# Patient Record
Sex: Male | Born: 1935
Health system: Southern US, Community
[De-identification: ages and names within clinical notes are randomized; demographics above are authoritative.]

## PROBLEM LIST (undated history)

## (undated) DIAGNOSIS — F329 Major depressive disorder, single episode, unspecified: Secondary | ICD-10-CM

## (undated) DIAGNOSIS — K2970 Gastritis, unspecified, without bleeding: Secondary | ICD-10-CM

## (undated) DIAGNOSIS — K589 Irritable bowel syndrome without diarrhea: Secondary | ICD-10-CM

## (undated) DIAGNOSIS — H269 Unspecified cataract: Secondary | ICD-10-CM

## (undated) DIAGNOSIS — I6330 Cerebral infarction due to thrombosis of unspecified cerebral artery: Secondary | ICD-10-CM

## (undated) DIAGNOSIS — C44310 Basal cell carcinoma of skin of unspecified parts of face: Secondary | ICD-10-CM

## (undated) DIAGNOSIS — M1712 Unilateral primary osteoarthritis, left knee: Secondary | ICD-10-CM

## (undated) DIAGNOSIS — K222 Esophageal obstruction: Secondary | ICD-10-CM

## (undated) DIAGNOSIS — C4492 Squamous cell carcinoma of skin, unspecified: Secondary | ICD-10-CM

## (undated) DIAGNOSIS — F419 Anxiety disorder, unspecified: Secondary | ICD-10-CM

## (undated) DIAGNOSIS — N401 Enlarged prostate with lower urinary tract symptoms: Secondary | ICD-10-CM

## (undated) DIAGNOSIS — J189 Pneumonia, unspecified organism: Secondary | ICD-10-CM

## (undated) DIAGNOSIS — K5903 Drug induced constipation: Secondary | ICD-10-CM

## (undated) DIAGNOSIS — N183 Chronic kidney disease, stage 3 (moderate): Secondary | ICD-10-CM

## (undated) DIAGNOSIS — G894 Chronic pain syndrome: Secondary | ICD-10-CM

## (undated) DIAGNOSIS — E785 Hyperlipidemia, unspecified: Secondary | ICD-10-CM

## (undated) DIAGNOSIS — K219 Gastro-esophageal reflux disease without esophagitis: Secondary | ICD-10-CM

## (undated) DIAGNOSIS — I10 Essential (primary) hypertension: Secondary | ICD-10-CM

## (undated) DIAGNOSIS — M1610 Unilateral primary osteoarthritis, unspecified hip: Secondary | ICD-10-CM

## (undated) DIAGNOSIS — K5730 Diverticulosis of large intestine without perforation or abscess without bleeding: Secondary | ICD-10-CM

## (undated) DIAGNOSIS — F1020 Alcohol dependence, uncomplicated: Secondary | ICD-10-CM

## (undated) DIAGNOSIS — I700 Atherosclerosis of aorta: Secondary | ICD-10-CM

## (undated) DIAGNOSIS — E669 Obesity, unspecified: Secondary | ICD-10-CM

## (undated) DIAGNOSIS — M7989 Other specified soft tissue disorders: Secondary | ICD-10-CM

## (undated) DIAGNOSIS — N138 Other obstructive and reflux uropathy: Secondary | ICD-10-CM

## (undated) HISTORY — DX: Irritable bowel syndrome, unspecified: K58.9

## (undated) HISTORY — DX: Chronic pain syndrome: G89.4

## (undated) HISTORY — PX: APPENDECTOMY: SHX54

## (undated) HISTORY — DX: Major depressive disorder, single episode, unspecified: F32.9

## (undated) HISTORY — DX: Drug induced constipation: K59.03

## (undated) HISTORY — DX: Squamous cell carcinoma of skin, unspecified: C44.92

## (undated) HISTORY — DX: Anxiety disorder, unspecified: F41.9

## (undated) HISTORY — DX: Other specified soft tissue disorders: M79.89

## (undated) HISTORY — DX: Diverticulosis of large intestine without perforation or abscess without bleeding: K57.30

## (undated) HISTORY — DX: Atherosclerosis of aorta: I70.0

## (undated) HISTORY — DX: Unilateral primary osteoarthritis, unspecified hip: M16.10

## (undated) HISTORY — DX: Basal cell carcinoma of skin of unspecified parts of face: C44.310

## (undated) HISTORY — DX: Obesity, unspecified: E66.9

## (undated) HISTORY — DX: Essential (primary) hypertension: I10

## (undated) HISTORY — DX: Chronic kidney disease, stage 3 (moderate): N18.3

## (undated) HISTORY — DX: Alcohol dependence, uncomplicated: F10.20

## (undated) HISTORY — DX: Benign prostatic hyperplasia with lower urinary tract symptoms: N40.1

## (undated) HISTORY — DX: Other obstructive and reflux uropathy: N13.8

## (undated) HISTORY — PX: EYE SURGERY: SHX253

## (undated) HISTORY — DX: Unspecified cataract: H26.9

## (undated) HISTORY — DX: Gastro-esophageal reflux disease without esophagitis: K21.9

## (undated) HISTORY — DX: Gastritis, unspecified, without bleeding: K29.70

## (undated) HISTORY — DX: Esophageal obstruction: K22.2

## (undated) HISTORY — DX: Unilateral primary osteoarthritis, left knee: M17.12

---
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
## 1981-06-20 HISTORY — PX: FRACTURE SURGERY: SHX138

## 1997-10-24 ENCOUNTER — Encounter: Admission: RE | Admit: 1997-10-24 | Discharge: 1997-10-24 | Payer: Self-pay | Admitting: Internal Medicine

## 1998-02-11 ENCOUNTER — Encounter: Admission: RE | Admit: 1998-02-11 | Discharge: 1998-02-11 | Payer: Self-pay | Admitting: Internal Medicine

## 1998-05-07 ENCOUNTER — Encounter: Admission: RE | Admit: 1998-05-07 | Discharge: 1998-05-07 | Payer: Self-pay | Admitting: Hematology and Oncology

## 1998-05-26 ENCOUNTER — Encounter: Admission: RE | Admit: 1998-05-26 | Discharge: 1998-05-26 | Payer: Self-pay | Admitting: Internal Medicine

## 1998-06-23 ENCOUNTER — Ambulatory Visit (HOSPITAL_COMMUNITY): Admission: RE | Admit: 1998-06-23 | Discharge: 1998-06-23 | Payer: Self-pay | Admitting: Internal Medicine

## 1998-06-23 ENCOUNTER — Encounter: Admission: RE | Admit: 1998-06-23 | Discharge: 1998-06-23 | Payer: Self-pay | Admitting: Internal Medicine

## 1998-06-23 ENCOUNTER — Encounter: Payer: Self-pay | Admitting: Internal Medicine

## 1998-08-25 ENCOUNTER — Encounter: Admission: RE | Admit: 1998-08-25 | Discharge: 1998-08-25 | Payer: Self-pay | Admitting: Internal Medicine

## 1998-09-28 ENCOUNTER — Encounter: Admission: RE | Admit: 1998-09-28 | Discharge: 1998-09-28 | Payer: Self-pay | Admitting: Internal Medicine

## 1998-11-23 ENCOUNTER — Encounter: Admission: RE | Admit: 1998-11-23 | Discharge: 1998-11-23 | Payer: Self-pay | Admitting: Internal Medicine

## 1999-01-20 ENCOUNTER — Ambulatory Visit (HOSPITAL_COMMUNITY): Admission: RE | Admit: 1999-01-20 | Discharge: 1999-01-20 | Payer: Self-pay | Admitting: Internal Medicine

## 1999-01-20 ENCOUNTER — Encounter: Payer: Self-pay | Admitting: Internal Medicine

## 1999-01-20 ENCOUNTER — Encounter: Admission: RE | Admit: 1999-01-20 | Discharge: 1999-01-20 | Payer: Self-pay | Admitting: Internal Medicine

## 1999-02-03 ENCOUNTER — Encounter: Admission: RE | Admit: 1999-02-03 | Discharge: 1999-02-03 | Payer: Self-pay | Admitting: Internal Medicine

## 1999-02-12 ENCOUNTER — Ambulatory Visit (HOSPITAL_COMMUNITY): Admission: RE | Admit: 1999-02-12 | Discharge: 1999-02-12 | Payer: Self-pay | Admitting: Internal Medicine

## 1999-02-12 ENCOUNTER — Encounter: Payer: Self-pay | Admitting: Internal Medicine

## 1999-02-17 ENCOUNTER — Encounter: Admission: RE | Admit: 1999-02-17 | Discharge: 1999-02-17 | Payer: Self-pay | Admitting: Internal Medicine

## 1999-02-28 ENCOUNTER — Emergency Department (HOSPITAL_COMMUNITY): Admission: EM | Admit: 1999-02-28 | Discharge: 1999-02-28 | Payer: Self-pay | Admitting: Emergency Medicine

## 1999-02-28 ENCOUNTER — Encounter: Payer: Self-pay | Admitting: Emergency Medicine

## 1999-03-04 ENCOUNTER — Encounter: Payer: Self-pay | Admitting: *Deleted

## 1999-03-04 ENCOUNTER — Emergency Department (HOSPITAL_COMMUNITY): Admission: EM | Admit: 1999-03-04 | Discharge: 1999-03-04 | Payer: Self-pay | Admitting: *Deleted

## 1999-03-10 ENCOUNTER — Encounter: Admission: RE | Admit: 1999-03-10 | Discharge: 1999-03-10 | Payer: Self-pay | Admitting: Internal Medicine

## 1999-03-24 ENCOUNTER — Encounter: Admission: RE | Admit: 1999-03-24 | Discharge: 1999-03-24 | Payer: Self-pay | Admitting: Internal Medicine

## 1999-03-25 ENCOUNTER — Encounter: Admission: RE | Admit: 1999-03-25 | Discharge: 1999-06-23 | Payer: Self-pay | Admitting: Internal Medicine

## 1999-04-06 ENCOUNTER — Encounter: Admission: RE | Admit: 1999-04-06 | Discharge: 1999-04-06 | Payer: Self-pay | Admitting: Hematology and Oncology

## 1999-04-27 ENCOUNTER — Encounter: Admission: RE | Admit: 1999-04-27 | Discharge: 1999-04-27 | Payer: Self-pay | Admitting: Internal Medicine

## 1999-05-19 ENCOUNTER — Encounter: Admission: RE | Admit: 1999-05-19 | Discharge: 1999-05-19 | Payer: Self-pay | Admitting: Internal Medicine

## 1999-07-08 ENCOUNTER — Encounter: Admission: RE | Admit: 1999-07-08 | Discharge: 1999-10-06 | Payer: Self-pay | Admitting: Internal Medicine

## 1999-08-04 ENCOUNTER — Encounter: Admission: RE | Admit: 1999-08-04 | Discharge: 1999-08-04 | Payer: Self-pay | Admitting: Internal Medicine

## 1999-09-22 ENCOUNTER — Encounter: Admission: RE | Admit: 1999-09-22 | Discharge: 1999-09-22 | Payer: Self-pay | Admitting: Internal Medicine

## 1999-12-01 ENCOUNTER — Encounter: Admission: RE | Admit: 1999-12-01 | Discharge: 1999-12-01 | Payer: Self-pay | Admitting: Internal Medicine

## 2000-01-19 ENCOUNTER — Ambulatory Visit (HOSPITAL_COMMUNITY): Admission: RE | Admit: 2000-01-19 | Discharge: 2000-01-19 | Payer: Self-pay | Admitting: Hematology and Oncology

## 2000-01-19 ENCOUNTER — Encounter: Payer: Self-pay | Admitting: Hematology and Oncology

## 2000-01-19 ENCOUNTER — Encounter: Admission: RE | Admit: 2000-01-19 | Discharge: 2000-01-19 | Payer: Self-pay | Admitting: Hematology and Oncology

## 2000-02-17 ENCOUNTER — Emergency Department (HOSPITAL_COMMUNITY): Admission: EM | Admit: 2000-02-17 | Discharge: 2000-02-17 | Payer: Self-pay | Admitting: Emergency Medicine

## 2000-03-21 ENCOUNTER — Encounter: Admission: RE | Admit: 2000-03-21 | Discharge: 2000-03-21 | Payer: Self-pay | Admitting: Internal Medicine

## 2000-07-05 ENCOUNTER — Encounter: Admission: RE | Admit: 2000-07-05 | Discharge: 2000-07-05 | Payer: Self-pay | Admitting: Internal Medicine

## 2000-08-03 ENCOUNTER — Encounter (INDEPENDENT_AMBULATORY_CARE_PROVIDER_SITE_OTHER): Payer: Self-pay | Admitting: Specialist

## 2000-08-03 ENCOUNTER — Other Ambulatory Visit: Admission: RE | Admit: 2000-08-03 | Discharge: 2000-08-03 | Payer: Self-pay | Admitting: Gastroenterology

## 2000-08-29 ENCOUNTER — Encounter: Admission: RE | Admit: 2000-08-29 | Discharge: 2000-08-29 | Payer: Self-pay | Admitting: Internal Medicine

## 2000-11-01 ENCOUNTER — Encounter: Admission: RE | Admit: 2000-11-01 | Discharge: 2001-01-30 | Payer: Self-pay | Admitting: Anesthesiology

## 2000-11-07 ENCOUNTER — Encounter: Admission: RE | Admit: 2000-11-07 | Discharge: 2000-11-07 | Payer: Self-pay | Admitting: Internal Medicine

## 2000-12-19 ENCOUNTER — Encounter: Admission: RE | Admit: 2000-12-19 | Discharge: 2000-12-19 | Payer: Self-pay | Admitting: Internal Medicine

## 2001-02-13 ENCOUNTER — Encounter: Admission: RE | Admit: 2001-02-13 | Discharge: 2001-02-17 | Payer: Self-pay | Admitting: Anesthesiology

## 2001-02-21 ENCOUNTER — Encounter: Admission: RE | Admit: 2001-02-21 | Discharge: 2001-02-21 | Payer: Self-pay | Admitting: Internal Medicine

## 2001-03-19 ENCOUNTER — Encounter: Admission: RE | Admit: 2001-03-19 | Discharge: 2001-03-19 | Payer: Self-pay | Admitting: Internal Medicine

## 2001-05-01 ENCOUNTER — Encounter: Admission: RE | Admit: 2001-05-01 | Discharge: 2001-05-01 | Payer: Self-pay | Admitting: Internal Medicine

## 2001-05-09 ENCOUNTER — Ambulatory Visit (HOSPITAL_COMMUNITY): Admission: RE | Admit: 2001-05-09 | Discharge: 2001-05-09 | Payer: Self-pay | Admitting: Internal Medicine

## 2001-05-14 ENCOUNTER — Encounter: Admission: RE | Admit: 2001-05-14 | Discharge: 2001-05-14 | Payer: Self-pay | Admitting: Internal Medicine

## 2001-06-06 ENCOUNTER — Encounter: Admission: RE | Admit: 2001-06-06 | Discharge: 2001-06-06 | Payer: Self-pay | Admitting: Internal Medicine

## 2001-07-18 ENCOUNTER — Encounter: Admission: RE | Admit: 2001-07-18 | Discharge: 2001-07-18 | Payer: Self-pay | Admitting: Internal Medicine

## 2001-09-12 ENCOUNTER — Encounter: Admission: RE | Admit: 2001-09-12 | Discharge: 2001-09-12 | Payer: Self-pay | Admitting: Internal Medicine

## 2001-10-15 ENCOUNTER — Encounter: Payer: Self-pay | Admitting: Emergency Medicine

## 2001-10-15 ENCOUNTER — Inpatient Hospital Stay (HOSPITAL_COMMUNITY): Admission: EM | Admit: 2001-10-15 | Discharge: 2001-10-17 | Payer: Self-pay | Admitting: Emergency Medicine

## 2001-10-16 ENCOUNTER — Encounter: Payer: Self-pay | Admitting: Internal Medicine

## 2001-11-14 ENCOUNTER — Encounter: Admission: RE | Admit: 2001-11-14 | Discharge: 2001-11-14 | Payer: Self-pay | Admitting: Internal Medicine

## 2001-12-26 ENCOUNTER — Encounter: Admission: RE | Admit: 2001-12-26 | Discharge: 2001-12-26 | Payer: Self-pay | Admitting: Internal Medicine

## 2002-01-15 ENCOUNTER — Encounter: Admission: RE | Admit: 2002-01-15 | Discharge: 2002-01-15 | Payer: Self-pay | Admitting: Internal Medicine

## 2002-03-19 ENCOUNTER — Encounter: Admission: RE | Admit: 2002-03-19 | Discharge: 2002-03-19 | Payer: Self-pay | Admitting: Internal Medicine

## 2002-03-22 ENCOUNTER — Encounter: Admission: RE | Admit: 2002-03-22 | Discharge: 2002-03-22 | Payer: Self-pay | Admitting: Internal Medicine

## 2002-05-22 ENCOUNTER — Encounter: Admission: RE | Admit: 2002-05-22 | Discharge: 2002-05-22 | Payer: Self-pay | Admitting: Internal Medicine

## 2002-07-31 ENCOUNTER — Encounter: Admission: RE | Admit: 2002-07-31 | Discharge: 2002-07-31 | Payer: Self-pay | Admitting: Internal Medicine

## 2002-10-18 ENCOUNTER — Encounter: Admission: RE | Admit: 2002-10-18 | Discharge: 2002-10-18 | Payer: Self-pay | Admitting: Internal Medicine

## 2002-12-18 ENCOUNTER — Ambulatory Visit (HOSPITAL_COMMUNITY): Admission: RE | Admit: 2002-12-18 | Discharge: 2002-12-18 | Payer: Self-pay | Admitting: Internal Medicine

## 2002-12-18 ENCOUNTER — Encounter: Admission: RE | Admit: 2002-12-18 | Discharge: 2002-12-18 | Payer: Self-pay | Admitting: Internal Medicine

## 2003-02-04 ENCOUNTER — Encounter: Admission: RE | Admit: 2003-02-04 | Discharge: 2003-02-04 | Payer: Self-pay | Admitting: Internal Medicine

## 2003-02-18 ENCOUNTER — Encounter: Payer: Self-pay | Admitting: Gastroenterology

## 2003-04-01 ENCOUNTER — Encounter: Admission: RE | Admit: 2003-04-01 | Discharge: 2003-04-01 | Payer: Self-pay | Admitting: Internal Medicine

## 2003-06-05 DIAGNOSIS — C4492 Squamous cell carcinoma of skin, unspecified: Secondary | ICD-10-CM

## 2003-06-05 DIAGNOSIS — C4491 Basal cell carcinoma of skin, unspecified: Secondary | ICD-10-CM

## 2003-06-05 HISTORY — DX: Squamous cell carcinoma of skin, unspecified: C44.92

## 2003-06-05 HISTORY — DX: Basal cell carcinoma of skin, unspecified: C44.91

## 2003-06-24 ENCOUNTER — Encounter: Admission: RE | Admit: 2003-06-24 | Discharge: 2003-06-24 | Payer: Self-pay | Admitting: Internal Medicine

## 2003-07-08 ENCOUNTER — Encounter: Admission: RE | Admit: 2003-07-08 | Discharge: 2003-07-08 | Payer: Self-pay | Admitting: Internal Medicine

## 2003-07-16 ENCOUNTER — Encounter: Admission: RE | Admit: 2003-07-16 | Discharge: 2003-07-16 | Payer: Self-pay | Admitting: Internal Medicine

## 2003-07-28 ENCOUNTER — Ambulatory Visit (HOSPITAL_COMMUNITY): Admission: RE | Admit: 2003-07-28 | Discharge: 2003-07-28 | Payer: Self-pay | Admitting: Neurology

## 2003-08-13 ENCOUNTER — Encounter: Admission: RE | Admit: 2003-08-13 | Discharge: 2003-08-13 | Payer: Self-pay | Admitting: Internal Medicine

## 2003-08-20 ENCOUNTER — Encounter: Admission: RE | Admit: 2003-08-20 | Discharge: 2003-08-20 | Payer: Self-pay | Admitting: Internal Medicine

## 2003-10-01 ENCOUNTER — Encounter: Admission: RE | Admit: 2003-10-01 | Discharge: 2003-10-01 | Payer: Self-pay | Admitting: Internal Medicine

## 2003-11-13 ENCOUNTER — Encounter: Admission: RE | Admit: 2003-11-13 | Discharge: 2003-11-13 | Payer: Self-pay | Admitting: Internal Medicine

## 2004-01-15 ENCOUNTER — Encounter: Admission: RE | Admit: 2004-01-15 | Discharge: 2004-01-15 | Payer: Self-pay | Admitting: Internal Medicine

## 2004-03-22 ENCOUNTER — Ambulatory Visit: Payer: Self-pay | Admitting: Internal Medicine

## 2004-04-19 ENCOUNTER — Ambulatory Visit: Payer: Self-pay | Admitting: Internal Medicine

## 2004-05-26 ENCOUNTER — Ambulatory Visit: Payer: Self-pay | Admitting: Internal Medicine

## 2004-07-26 ENCOUNTER — Ambulatory Visit: Payer: Self-pay | Admitting: Internal Medicine

## 2004-09-01 ENCOUNTER — Ambulatory Visit (HOSPITAL_COMMUNITY): Admission: RE | Admit: 2004-09-01 | Discharge: 2004-09-01 | Payer: Self-pay | Admitting: Anesthesiology

## 2004-10-25 ENCOUNTER — Ambulatory Visit: Payer: Self-pay | Admitting: Internal Medicine

## 2004-11-16 ENCOUNTER — Ambulatory Visit: Payer: Self-pay | Admitting: Gastroenterology

## 2004-11-22 ENCOUNTER — Ambulatory Visit: Payer: Self-pay | Admitting: Gastroenterology

## 2004-11-22 ENCOUNTER — Ambulatory Visit (HOSPITAL_COMMUNITY): Admission: RE | Admit: 2004-11-22 | Discharge: 2004-11-22 | Payer: Self-pay | Admitting: Gastroenterology

## 2004-11-22 ENCOUNTER — Encounter (INDEPENDENT_AMBULATORY_CARE_PROVIDER_SITE_OTHER): Payer: Self-pay | Admitting: *Deleted

## 2005-01-03 ENCOUNTER — Ambulatory Visit: Payer: Self-pay | Admitting: Internal Medicine

## 2005-01-06 ENCOUNTER — Ambulatory Visit (HOSPITAL_COMMUNITY): Admission: RE | Admit: 2005-01-06 | Discharge: 2005-01-06 | Payer: Self-pay | Admitting: Internal Medicine

## 2005-02-02 ENCOUNTER — Ambulatory Visit: Payer: Self-pay | Admitting: Internal Medicine

## 2005-02-14 ENCOUNTER — Ambulatory Visit: Payer: Self-pay | Admitting: Internal Medicine

## 2005-04-04 ENCOUNTER — Ambulatory Visit: Payer: Self-pay | Admitting: Internal Medicine

## 2005-07-18 ENCOUNTER — Ambulatory Visit: Payer: Self-pay | Admitting: Internal Medicine

## 2005-09-12 ENCOUNTER — Ambulatory Visit: Payer: Self-pay | Admitting: Internal Medicine

## 2005-09-27 ENCOUNTER — Ambulatory Visit: Payer: Self-pay | Admitting: Internal Medicine

## 2005-10-31 ENCOUNTER — Ambulatory Visit: Payer: Self-pay | Admitting: Internal Medicine

## 2005-11-22 ENCOUNTER — Ambulatory Visit: Payer: Self-pay | Admitting: Internal Medicine

## 2006-01-02 ENCOUNTER — Ambulatory Visit: Payer: Self-pay | Admitting: Internal Medicine

## 2006-02-07 ENCOUNTER — Ambulatory Visit: Payer: Self-pay | Admitting: Internal Medicine

## 2006-05-08 ENCOUNTER — Ambulatory Visit: Payer: Self-pay | Admitting: Internal Medicine

## 2006-05-08 LAB — CONVERTED CEMR LAB
AST: 25 units/L (ref 0–37)
Albumin: 4.5 g/dL (ref 3.5–5.2)
BUN: 29 mg/dL — ABNORMAL HIGH (ref 6–23)
Calcium: 9.6 mg/dL (ref 8.4–10.5)
Chloride: 99 meq/L (ref 96–112)
Glucose, Bld: 81 mg/dL (ref 70–99)
Lymphocytes Relative: 34 % (ref 15–43)
Lymphs Abs: 2.2 10*3/uL (ref 0.8–3.1)
Neutrophils Relative %: 54 % (ref 47–77)
Platelets: 179 10*3/uL (ref 152–374)
Potassium: 4.3 meq/L (ref 3.5–5.3)
RDW: 12.3 % (ref 11.5–15.3)
Sodium: 139 meq/L (ref 135–145)
Total Protein: 7.2 g/dL (ref 6.0–8.3)
WBC: 6.7 10*3/uL (ref 3.7–10.0)

## 2006-05-22 ENCOUNTER — Ambulatory Visit: Payer: Self-pay | Admitting: Internal Medicine

## 2006-06-22 DIAGNOSIS — F332 Major depressive disorder, recurrent severe without psychotic features: Secondary | ICD-10-CM | POA: Insufficient documentation

## 2006-06-22 DIAGNOSIS — G894 Chronic pain syndrome: Secondary | ICD-10-CM

## 2006-06-22 DIAGNOSIS — F419 Anxiety disorder, unspecified: Secondary | ICD-10-CM

## 2006-06-22 DIAGNOSIS — I10 Essential (primary) hypertension: Secondary | ICD-10-CM

## 2006-06-22 DIAGNOSIS — N138 Other obstructive and reflux uropathy: Secondary | ICD-10-CM | POA: Insufficient documentation

## 2006-06-22 DIAGNOSIS — K222 Esophageal obstruction: Secondary | ICD-10-CM

## 2006-06-22 DIAGNOSIS — C44310 Basal cell carcinoma of skin of unspecified parts of face: Secondary | ICD-10-CM

## 2006-06-22 DIAGNOSIS — N401 Enlarged prostate with lower urinary tract symptoms: Secondary | ICD-10-CM

## 2006-06-22 DIAGNOSIS — K219 Gastro-esophageal reflux disease without esophagitis: Secondary | ICD-10-CM

## 2006-06-22 DIAGNOSIS — K589 Irritable bowel syndrome without diarrhea: Secondary | ICD-10-CM

## 2006-06-22 DIAGNOSIS — K5730 Diverticulosis of large intestine without perforation or abscess without bleeding: Secondary | ICD-10-CM

## 2006-06-22 DIAGNOSIS — F329 Major depressive disorder, single episode, unspecified: Secondary | ICD-10-CM

## 2006-06-22 DIAGNOSIS — F1020 Alcohol dependence, uncomplicated: Secondary | ICD-10-CM | POA: Insufficient documentation

## 2006-06-22 HISTORY — DX: Diverticulosis of large intestine without perforation or abscess without bleeding: K57.30

## 2006-06-22 HISTORY — DX: Major depressive disorder, single episode, unspecified: F32.9

## 2006-06-22 HISTORY — DX: Anxiety disorder, unspecified: F41.9

## 2006-06-22 HISTORY — DX: Benign prostatic hyperplasia with lower urinary tract symptoms: N13.8

## 2006-06-22 HISTORY — DX: Essential (primary) hypertension: I10

## 2006-06-22 HISTORY — DX: Gastro-esophageal reflux disease without esophagitis: K21.9

## 2006-06-22 HISTORY — DX: Chronic pain syndrome: G89.4

## 2006-06-26 ENCOUNTER — Ambulatory Visit: Payer: Self-pay | Admitting: Internal Medicine

## 2006-06-28 ENCOUNTER — Ambulatory Visit (HOSPITAL_COMMUNITY): Admission: RE | Admit: 2006-06-28 | Discharge: 2006-06-28 | Payer: Self-pay | Admitting: Internal Medicine

## 2006-07-03 ENCOUNTER — Ambulatory Visit: Payer: Self-pay | Admitting: Internal Medicine

## 2006-07-17 ENCOUNTER — Ambulatory Visit: Payer: Self-pay | Admitting: Gastroenterology

## 2006-08-02 ENCOUNTER — Ambulatory Visit: Payer: Self-pay | Admitting: Gastroenterology

## 2006-08-02 ENCOUNTER — Encounter: Payer: Self-pay | Admitting: Internal Medicine

## 2006-08-02 ENCOUNTER — Encounter (INDEPENDENT_AMBULATORY_CARE_PROVIDER_SITE_OTHER): Payer: Self-pay | Admitting: *Deleted

## 2006-08-21 ENCOUNTER — Ambulatory Visit: Payer: Self-pay | Admitting: Internal Medicine

## 2006-08-28 ENCOUNTER — Telehealth: Payer: Self-pay | Admitting: *Deleted

## 2006-08-30 ENCOUNTER — Telehealth: Payer: Self-pay | Admitting: *Deleted

## 2006-08-31 ENCOUNTER — Ambulatory Visit: Payer: Self-pay | Admitting: Gastroenterology

## 2006-09-25 ENCOUNTER — Telehealth: Payer: Self-pay | Admitting: *Deleted

## 2006-10-30 ENCOUNTER — Ambulatory Visit: Payer: Self-pay | Admitting: Internal Medicine

## 2006-10-30 DIAGNOSIS — R42 Dizziness and giddiness: Secondary | ICD-10-CM | POA: Insufficient documentation

## 2006-10-30 LAB — CONVERTED CEMR LAB
BUN: 31 mg/dL — ABNORMAL HIGH (ref 6–23)
Chloride: 93 meq/L — ABNORMAL LOW (ref 96–112)
Creatinine, Ser: 1.41 mg/dL (ref 0.40–1.50)
Potassium: 4.4 meq/L (ref 3.5–5.3)

## 2006-11-03 ENCOUNTER — Telehealth (INDEPENDENT_AMBULATORY_CARE_PROVIDER_SITE_OTHER): Payer: Self-pay | Admitting: *Deleted

## 2006-12-07 ENCOUNTER — Telehealth: Payer: Self-pay | Admitting: *Deleted

## 2006-12-19 ENCOUNTER — Telehealth: Payer: Self-pay | Admitting: *Deleted

## 2006-12-25 ENCOUNTER — Ambulatory Visit: Payer: Self-pay | Admitting: Internal Medicine

## 2006-12-25 LAB — CONVERTED CEMR LAB
Albumin: 4.3 g/dL (ref 3.5–5.2)
Alkaline Phosphatase: 87 units/L (ref 39–117)
BUN: 27 mg/dL — ABNORMAL HIGH (ref 6–23)
CO2: 27 meq/L (ref 19–32)
Calcium: 9.2 mg/dL (ref 8.4–10.5)
Glucose, Bld: 91 mg/dL (ref 70–99)
Hemoglobin: 13 g/dL (ref 13.0–17.0)
MCHC: 33.9 g/dL (ref 30.0–36.0)
Potassium: 5.2 meq/L (ref 3.5–5.3)
RBC: 4.06 M/uL — ABNORMAL LOW (ref 4.22–5.81)
Sodium: 131 meq/L — ABNORMAL LOW (ref 135–145)
Total Protein: 7 g/dL (ref 6.0–8.3)
WBC: 5.7 10*3/uL (ref 4.0–10.5)

## 2007-01-01 ENCOUNTER — Telehealth (INDEPENDENT_AMBULATORY_CARE_PROVIDER_SITE_OTHER): Payer: Self-pay | Admitting: *Deleted

## 2007-01-01 ENCOUNTER — Encounter: Payer: Self-pay | Admitting: Internal Medicine

## 2007-01-04 ENCOUNTER — Ambulatory Visit: Admission: RE | Admit: 2007-01-04 | Discharge: 2007-03-19 | Payer: Self-pay | Admitting: Radiation Oncology

## 2007-01-10 ENCOUNTER — Ambulatory Visit (HOSPITAL_COMMUNITY): Admission: RE | Admit: 2007-01-10 | Discharge: 2007-01-10 | Payer: Self-pay | Admitting: Radiation Oncology

## 2007-01-17 ENCOUNTER — Telehealth (INDEPENDENT_AMBULATORY_CARE_PROVIDER_SITE_OTHER): Payer: Self-pay | Admitting: *Deleted

## 2007-01-31 ENCOUNTER — Ambulatory Visit: Payer: Self-pay | Admitting: Internal Medicine

## 2007-02-02 ENCOUNTER — Telehealth: Payer: Self-pay | Admitting: Internal Medicine

## 2007-03-01 ENCOUNTER — Encounter: Payer: Self-pay | Admitting: Internal Medicine

## 2007-03-01 ENCOUNTER — Telehealth (INDEPENDENT_AMBULATORY_CARE_PROVIDER_SITE_OTHER): Payer: Self-pay | Admitting: Internal Medicine

## 2007-03-16 ENCOUNTER — Telehealth: Payer: Self-pay | Admitting: Internal Medicine

## 2007-04-10 ENCOUNTER — Telehealth: Payer: Self-pay | Admitting: Internal Medicine

## 2007-04-11 ENCOUNTER — Telehealth: Payer: Self-pay | Admitting: *Deleted

## 2007-04-30 ENCOUNTER — Ambulatory Visit: Payer: Self-pay | Admitting: Internal Medicine

## 2007-04-30 LAB — CONVERTED CEMR LAB
BUN: 24 mg/dL — ABNORMAL HIGH (ref 6–23)
Chloride: 97 meq/L (ref 96–112)
Potassium: 5 meq/L (ref 3.5–5.3)
Sodium: 135 meq/L (ref 135–145)

## 2007-05-08 ENCOUNTER — Encounter: Payer: Self-pay | Admitting: Internal Medicine

## 2007-05-25 ENCOUNTER — Ambulatory Visit: Payer: Self-pay | Admitting: Internal Medicine

## 2007-05-25 LAB — CONVERTED CEMR LAB
BUN: 32 mg/dL — ABNORMAL HIGH (ref 6–23)
CO2: 26 meq/L (ref 19–32)
Chloride: 94 meq/L — ABNORMAL LOW (ref 96–112)
Glucose, Bld: 94 mg/dL (ref 70–99)
Potassium: 5.3 meq/L (ref 3.5–5.3)
Sodium: 133 meq/L — ABNORMAL LOW (ref 135–145)

## 2007-06-06 ENCOUNTER — Encounter: Payer: Self-pay | Admitting: Internal Medicine

## 2007-06-07 ENCOUNTER — Telehealth: Payer: Self-pay | Admitting: Internal Medicine

## 2007-06-29 ENCOUNTER — Ambulatory Visit: Payer: Self-pay | Admitting: Internal Medicine

## 2007-06-29 ENCOUNTER — Encounter: Payer: Self-pay | Admitting: Licensed Clinical Social Worker

## 2007-07-09 ENCOUNTER — Encounter: Payer: Self-pay | Admitting: Internal Medicine

## 2007-07-09 ENCOUNTER — Telehealth (INDEPENDENT_AMBULATORY_CARE_PROVIDER_SITE_OTHER): Payer: Self-pay | Admitting: *Deleted

## 2007-07-30 ENCOUNTER — Ambulatory Visit: Payer: Self-pay | Admitting: Internal Medicine

## 2007-08-09 ENCOUNTER — Telehealth: Payer: Self-pay | Admitting: *Deleted

## 2007-09-11 ENCOUNTER — Encounter: Payer: Self-pay | Admitting: Internal Medicine

## 2007-09-11 ENCOUNTER — Telehealth: Payer: Self-pay | Admitting: Internal Medicine

## 2007-10-10 ENCOUNTER — Ambulatory Visit: Payer: Self-pay | Admitting: Internal Medicine

## 2007-11-08 ENCOUNTER — Encounter: Payer: Self-pay | Admitting: Internal Medicine

## 2007-12-03 ENCOUNTER — Ambulatory Visit: Payer: Self-pay | Admitting: Internal Medicine

## 2007-12-05 LAB — CONVERTED CEMR LAB
BUN: 31 mg/dL — ABNORMAL HIGH (ref 6–23)
CO2: 25 meq/L (ref 19–32)
Calcium: 9 mg/dL (ref 8.4–10.5)
Cholesterol: 139 mg/dL (ref 0–200)
Creatinine, Ser: 1.47 mg/dL (ref 0.40–1.50)
Glucose, Bld: 91 mg/dL (ref 70–99)
Sodium: 131 meq/L — ABNORMAL LOW (ref 135–145)
Total CHOL/HDL Ratio: 3.5

## 2007-12-07 ENCOUNTER — Telehealth: Payer: Self-pay | Admitting: Internal Medicine

## 2008-01-04 ENCOUNTER — Emergency Department (HOSPITAL_COMMUNITY): Admission: EM | Admit: 2008-01-04 | Discharge: 2008-01-05 | Payer: Self-pay | Admitting: Emergency Medicine

## 2008-01-07 ENCOUNTER — Ambulatory Visit: Payer: Self-pay | Admitting: *Deleted

## 2008-01-07 ENCOUNTER — Telehealth: Payer: Self-pay | Admitting: Internal Medicine

## 2008-01-07 ENCOUNTER — Encounter: Payer: Self-pay | Admitting: Internal Medicine

## 2008-01-08 LAB — CONVERTED CEMR LAB
BUN: 25 mg/dL — ABNORMAL HIGH (ref 6–23)
Chloride: 93 meq/L — ABNORMAL LOW (ref 96–112)
Creatinine, Ser: 1.47 mg/dL (ref 0.40–1.50)
Eosinophils Absolute: 0.1 10*3/uL (ref 0.0–0.7)
Glucose, Bld: 111 mg/dL — ABNORMAL HIGH (ref 70–99)
Hemoglobin: 11.1 g/dL — ABNORMAL LOW (ref 13.0–17.0)
Lymphs Abs: 1.2 10*3/uL (ref 0.7–4.0)
MCHC: 35.7 g/dL (ref 30.0–36.0)
MCV: 94.8 fL (ref 78.0–100.0)
Monocytes Absolute: 0.8 10*3/uL (ref 0.1–1.0)
Monocytes Relative: 12 % (ref 3–12)
Neutro Abs: 4.5 10*3/uL (ref 1.7–7.7)
Neutrophils Relative %: 69 % (ref 43–77)
Potassium: 4.6 meq/L (ref 3.5–5.3)
RBC: 3.28 M/uL — ABNORMAL LOW (ref 4.22–5.81)
WBC: 6.6 10*3/uL (ref 4.0–10.5)

## 2008-01-16 ENCOUNTER — Encounter: Payer: Self-pay | Admitting: Internal Medicine

## 2008-01-22 ENCOUNTER — Ambulatory Visit: Payer: Self-pay | Admitting: *Deleted

## 2008-01-22 ENCOUNTER — Encounter (INDEPENDENT_AMBULATORY_CARE_PROVIDER_SITE_OTHER): Payer: Self-pay | Admitting: *Deleted

## 2008-01-22 ENCOUNTER — Ambulatory Visit: Payer: Self-pay | Admitting: Vascular Surgery

## 2008-01-22 ENCOUNTER — Encounter: Payer: Self-pay | Admitting: Internal Medicine

## 2008-01-22 ENCOUNTER — Ambulatory Visit (HOSPITAL_COMMUNITY): Admission: RE | Admit: 2008-01-22 | Discharge: 2008-01-22 | Payer: Self-pay | Admitting: *Deleted

## 2008-01-22 DIAGNOSIS — R609 Edema, unspecified: Secondary | ICD-10-CM

## 2008-01-23 LAB — CONVERTED CEMR LAB
Basophils Absolute: 0 10*3/uL (ref 0.0–0.1)
Basophils Relative: 0 % (ref 0–1)
Eosinophils Absolute: 0.2 10*3/uL (ref 0.0–0.7)
MCHC: 33.4 g/dL (ref 30.0–36.0)
MCV: 98 fL (ref 78.0–100.0)
Neutro Abs: 5.2 10*3/uL (ref 1.7–7.7)
Neutrophils Relative %: 68 % (ref 43–77)
Platelets: 245 10*3/uL (ref 150–400)

## 2008-01-25 ENCOUNTER — Ambulatory Visit: Payer: Self-pay | Admitting: *Deleted

## 2008-01-31 ENCOUNTER — Ambulatory Visit (HOSPITAL_COMMUNITY): Admission: RE | Admit: 2008-01-31 | Discharge: 2008-01-31 | Payer: Self-pay | Admitting: Internal Medicine

## 2008-01-31 ENCOUNTER — Encounter (INDEPENDENT_AMBULATORY_CARE_PROVIDER_SITE_OTHER): Payer: Self-pay | Admitting: Internal Medicine

## 2008-02-07 ENCOUNTER — Telehealth: Payer: Self-pay | Admitting: *Deleted

## 2008-02-08 ENCOUNTER — Encounter (INDEPENDENT_AMBULATORY_CARE_PROVIDER_SITE_OTHER): Payer: Self-pay | Admitting: Internal Medicine

## 2008-02-13 ENCOUNTER — Ambulatory Visit: Payer: Self-pay | Admitting: *Deleted

## 2008-02-13 ENCOUNTER — Inpatient Hospital Stay (HOSPITAL_COMMUNITY): Admission: AD | Admit: 2008-02-13 | Discharge: 2008-02-15 | Payer: Self-pay | Admitting: Internal Medicine

## 2008-02-13 ENCOUNTER — Ambulatory Visit: Payer: Self-pay | Admitting: Internal Medicine

## 2008-02-15 ENCOUNTER — Encounter: Payer: Self-pay | Admitting: Internal Medicine

## 2008-02-22 ENCOUNTER — Telehealth: Payer: Self-pay | Admitting: *Deleted

## 2008-02-29 ENCOUNTER — Encounter: Payer: Self-pay | Admitting: Internal Medicine

## 2008-02-29 ENCOUNTER — Ambulatory Visit: Payer: Self-pay | Admitting: Internal Medicine

## 2008-03-17 ENCOUNTER — Telehealth (INDEPENDENT_AMBULATORY_CARE_PROVIDER_SITE_OTHER): Payer: Self-pay | Admitting: *Deleted

## 2008-03-18 ENCOUNTER — Encounter: Payer: Self-pay | Admitting: Internal Medicine

## 2008-03-18 ENCOUNTER — Ambulatory Visit: Payer: Self-pay | Admitting: Internal Medicine

## 2008-03-18 ENCOUNTER — Ambulatory Visit (HOSPITAL_COMMUNITY): Admission: RE | Admit: 2008-03-18 | Discharge: 2008-03-18 | Payer: Self-pay | Admitting: Internal Medicine

## 2008-03-18 ENCOUNTER — Encounter: Payer: Self-pay | Admitting: Licensed Clinical Social Worker

## 2008-03-20 ENCOUNTER — Encounter: Payer: Self-pay | Admitting: Internal Medicine

## 2008-03-20 ENCOUNTER — Encounter: Payer: Self-pay | Admitting: Licensed Clinical Social Worker

## 2008-04-08 ENCOUNTER — Ambulatory Visit: Payer: Self-pay | Admitting: Internal Medicine

## 2008-04-08 ENCOUNTER — Encounter: Payer: Self-pay | Admitting: Internal Medicine

## 2008-04-08 ENCOUNTER — Ambulatory Visit (HOSPITAL_COMMUNITY): Admission: RE | Admit: 2008-04-08 | Discharge: 2008-04-08 | Payer: Self-pay | Admitting: Internal Medicine

## 2008-04-12 LAB — CONVERTED CEMR LAB
Potassium: 5 meq/L (ref 3.5–5.3)
Sodium: 138 meq/L (ref 135–145)

## 2008-04-15 ENCOUNTER — Telehealth: Payer: Self-pay | Admitting: Licensed Clinical Social Worker

## 2008-04-21 ENCOUNTER — Telehealth: Payer: Self-pay | Admitting: Internal Medicine

## 2008-04-25 ENCOUNTER — Ambulatory Visit: Payer: Self-pay | Admitting: Cardiology

## 2008-04-29 ENCOUNTER — Ambulatory Visit: Payer: Self-pay | Admitting: Internal Medicine

## 2008-04-29 ENCOUNTER — Encounter (INDEPENDENT_AMBULATORY_CARE_PROVIDER_SITE_OTHER): Payer: Self-pay | Admitting: Internal Medicine

## 2008-04-30 DIAGNOSIS — N259 Disorder resulting from impaired renal tubular function, unspecified: Secondary | ICD-10-CM | POA: Insufficient documentation

## 2008-04-30 LAB — CONVERTED CEMR LAB
CO2: 25 meq/L (ref 19–32)
Chloride: 96 meq/L (ref 96–112)
Glucose, Bld: 95 mg/dL (ref 70–99)
Potassium: 4.6 meq/L (ref 3.5–5.3)
Sodium: 133 meq/L — ABNORMAL LOW (ref 135–145)

## 2008-05-05 ENCOUNTER — Encounter: Payer: Self-pay | Admitting: Internal Medicine

## 2008-05-05 ENCOUNTER — Ambulatory Visit: Payer: Self-pay

## 2008-05-22 ENCOUNTER — Telehealth: Payer: Self-pay | Admitting: Internal Medicine

## 2008-05-23 ENCOUNTER — Encounter: Payer: Self-pay | Admitting: Internal Medicine

## 2008-06-08 ENCOUNTER — Emergency Department (HOSPITAL_COMMUNITY): Admission: EM | Admit: 2008-06-08 | Discharge: 2008-06-08 | Payer: Self-pay | Admitting: Emergency Medicine

## 2008-06-17 ENCOUNTER — Telehealth: Payer: Self-pay | Admitting: Infectious Diseases

## 2008-06-18 ENCOUNTER — Encounter: Payer: Self-pay | Admitting: Infectious Diseases

## 2008-06-20 HISTORY — PX: JOINT REPLACEMENT: SHX530

## 2008-06-27 ENCOUNTER — Ambulatory Visit: Payer: Self-pay | Admitting: Internal Medicine

## 2008-06-27 DIAGNOSIS — D649 Anemia, unspecified: Secondary | ICD-10-CM

## 2008-06-27 LAB — CONVERTED CEMR LAB
Eosinophils Absolute: 0.1 10*3/uL (ref 0.0–0.7)
Eosinophils Relative: 2 % (ref 0–5)
HCT: 40.2 % (ref 39.0–52.0)
Lymphs Abs: 1.7 10*3/uL (ref 0.7–4.0)
MCHC: 33.3 g/dL (ref 30.0–36.0)
MCV: 96.2 fL (ref 78.0–100.0)
Platelets: 237 10*3/uL (ref 150–400)
RDW: 14 % (ref 11.5–15.5)
Vitamin B-12: 573 pg/mL (ref 211–911)
WBC: 7.2 10*3/uL (ref 4.0–10.5)

## 2008-06-30 ENCOUNTER — Telehealth: Payer: Self-pay | Admitting: Internal Medicine

## 2008-07-03 ENCOUNTER — Telehealth: Payer: Self-pay | Admitting: Internal Medicine

## 2008-07-24 ENCOUNTER — Ambulatory Visit: Payer: Self-pay | Admitting: Internal Medicine

## 2008-07-25 ENCOUNTER — Ambulatory Visit: Payer: Self-pay | Admitting: Internal Medicine

## 2008-07-25 ENCOUNTER — Observation Stay (HOSPITAL_COMMUNITY): Admission: EM | Admit: 2008-07-25 | Discharge: 2008-07-26 | Payer: Self-pay | Admitting: Emergency Medicine

## 2008-07-25 ENCOUNTER — Telehealth (INDEPENDENT_AMBULATORY_CARE_PROVIDER_SITE_OTHER): Payer: Self-pay | Admitting: *Deleted

## 2008-07-28 ENCOUNTER — Encounter: Payer: Self-pay | Admitting: Internal Medicine

## 2008-07-28 ENCOUNTER — Encounter (INDEPENDENT_AMBULATORY_CARE_PROVIDER_SITE_OTHER): Payer: Self-pay | Admitting: Internal Medicine

## 2008-08-05 ENCOUNTER — Telehealth: Payer: Self-pay | Admitting: Internal Medicine

## 2008-08-08 ENCOUNTER — Encounter (INDEPENDENT_AMBULATORY_CARE_PROVIDER_SITE_OTHER): Payer: Self-pay | Admitting: *Deleted

## 2008-08-11 ENCOUNTER — Ambulatory Visit: Payer: Self-pay | Admitting: Internal Medicine

## 2008-08-11 ENCOUNTER — Encounter: Payer: Self-pay | Admitting: Internal Medicine

## 2008-08-11 LAB — CONVERTED CEMR LAB
CO2: 25 meq/L (ref 19–32)
Calcium: 9.1 mg/dL (ref 8.4–10.5)
Creatinine, Ser: 1.43 mg/dL (ref 0.40–1.50)

## 2008-08-26 ENCOUNTER — Telehealth: Payer: Self-pay | Admitting: Internal Medicine

## 2008-08-26 ENCOUNTER — Telehealth: Payer: Self-pay | Admitting: *Deleted

## 2008-08-27 ENCOUNTER — Telehealth: Payer: Self-pay | Admitting: Licensed Clinical Social Worker

## 2008-08-27 ENCOUNTER — Ambulatory Visit: Payer: Self-pay | Admitting: Internal Medicine

## 2008-08-28 ENCOUNTER — Encounter: Payer: Self-pay | Admitting: Licensed Clinical Social Worker

## 2008-09-23 ENCOUNTER — Encounter: Payer: Self-pay | Admitting: Internal Medicine

## 2008-09-23 ENCOUNTER — Telehealth: Payer: Self-pay | Admitting: Licensed Clinical Social Worker

## 2008-09-25 ENCOUNTER — Telehealth: Payer: Self-pay | Admitting: Internal Medicine

## 2008-09-26 ENCOUNTER — Telehealth: Payer: Self-pay | Admitting: Licensed Clinical Social Worker

## 2008-10-09 ENCOUNTER — Telehealth: Payer: Self-pay | Admitting: Licensed Clinical Social Worker

## 2008-10-09 ENCOUNTER — Telehealth: Payer: Self-pay | Admitting: Internal Medicine

## 2008-10-10 ENCOUNTER — Encounter: Payer: Self-pay | Admitting: Internal Medicine

## 2008-10-17 ENCOUNTER — Ambulatory Visit: Payer: Self-pay | Admitting: Internal Medicine

## 2008-10-22 ENCOUNTER — Ambulatory Visit: Payer: Self-pay | Admitting: Infectious Disease

## 2008-10-22 LAB — CONVERTED CEMR LAB: OCCULT 3: POSITIVE

## 2008-10-23 ENCOUNTER — Encounter: Payer: Self-pay | Admitting: Licensed Clinical Social Worker

## 2008-10-23 ENCOUNTER — Encounter: Payer: Self-pay | Admitting: Internal Medicine

## 2008-10-23 ENCOUNTER — Telehealth: Payer: Self-pay | Admitting: Licensed Clinical Social Worker

## 2008-10-27 ENCOUNTER — Telehealth: Payer: Self-pay | Admitting: Licensed Clinical Social Worker

## 2008-10-29 ENCOUNTER — Encounter: Payer: Self-pay | Admitting: Internal Medicine

## 2008-10-31 ENCOUNTER — Telehealth: Payer: Self-pay | Admitting: Internal Medicine

## 2008-10-31 ENCOUNTER — Ambulatory Visit: Payer: Self-pay | Admitting: Gastroenterology

## 2008-11-03 ENCOUNTER — Telehealth: Payer: Self-pay | Admitting: Gastroenterology

## 2008-11-06 ENCOUNTER — Encounter: Payer: Self-pay | Admitting: Internal Medicine

## 2008-11-06 ENCOUNTER — Telehealth: Payer: Self-pay | Admitting: Internal Medicine

## 2008-11-07 ENCOUNTER — Encounter: Payer: Self-pay | Admitting: Licensed Clinical Social Worker

## 2008-11-10 ENCOUNTER — Telehealth: Payer: Self-pay | Admitting: Gastroenterology

## 2008-11-12 ENCOUNTER — Telehealth: Payer: Self-pay | Admitting: Gastroenterology

## 2008-11-13 ENCOUNTER — Ambulatory Visit: Payer: Self-pay | Admitting: Gastroenterology

## 2008-11-13 ENCOUNTER — Ambulatory Visit (HOSPITAL_COMMUNITY): Admission: RE | Admit: 2008-11-13 | Discharge: 2008-11-14 | Payer: Self-pay | Admitting: Gastroenterology

## 2008-11-13 LAB — HM COLONOSCOPY: HM Colonoscopy: NORMAL

## 2008-12-03 ENCOUNTER — Telehealth: Payer: Self-pay | Admitting: *Deleted

## 2008-12-08 ENCOUNTER — Telehealth: Payer: Self-pay | Admitting: *Deleted

## 2008-12-08 ENCOUNTER — Telehealth: Payer: Self-pay | Admitting: Licensed Clinical Social Worker

## 2008-12-09 ENCOUNTER — Encounter: Payer: Self-pay | Admitting: Internal Medicine

## 2008-12-31 ENCOUNTER — Telehealth: Payer: Self-pay | Admitting: *Deleted

## 2009-01-01 ENCOUNTER — Encounter: Payer: Self-pay | Admitting: Internal Medicine

## 2009-01-01 ENCOUNTER — Telehealth: Payer: Self-pay | Admitting: Internal Medicine

## 2009-01-01 ENCOUNTER — Ambulatory Visit: Payer: Self-pay | Admitting: Infectious Diseases

## 2009-01-01 LAB — CONVERTED CEMR LAB
ALT: 16 units/L (ref 0–53)
Albumin: 4 g/dL (ref 3.5–5.2)
BUN: 20 mg/dL (ref 6–23)
CO2: 23 meq/L (ref 19–32)
Calcium: 8.9 mg/dL (ref 8.4–10.5)
Chloride: 102 meq/L (ref 96–112)
Creatinine, Ser: 1.64 mg/dL — ABNORMAL HIGH (ref 0.40–1.50)
Eosinophils Relative: 3 % (ref 0–5)
HCT: 35.4 % — ABNORMAL LOW (ref 39.0–52.0)
Hemoglobin: 11.8 g/dL — ABNORMAL LOW (ref 13.0–17.0)
Lymphocytes Relative: 24 % (ref 12–46)
Lymphs Abs: 1.9 10*3/uL (ref 0.7–4.0)
Neutro Abs: 4.9 10*3/uL (ref 1.7–7.7)
Platelets: 245 10*3/uL (ref 150–400)
WBC: 8 10*3/uL (ref 4.0–10.5)

## 2009-01-02 ENCOUNTER — Telehealth: Payer: Self-pay | Admitting: Licensed Clinical Social Worker

## 2009-01-02 LAB — CONVERTED CEMR LAB
CO2: 23 meq/L (ref 19–32)
Creatinine, Ser: 1.64 mg/dL — ABNORMAL HIGH (ref 0.40–1.50)
Eosinophils Absolute: 0.2 10*3/uL (ref 0.0–0.7)
Eosinophils Relative: 3 % (ref 0–5)
Glucose, Bld: 93 mg/dL (ref 70–99)
HCT: 35.4 % — ABNORMAL LOW (ref 39.0–52.0)
Hemoglobin: 11.8 g/dL — ABNORMAL LOW (ref 13.0–17.0)
Lymphs Abs: 1.9 10*3/uL (ref 0.7–4.0)
MCV: 97.5 fL (ref 78.0–100.0)
Monocytes Relative: 12 % (ref 3–12)
RBC: 3.63 M/uL — ABNORMAL LOW (ref 4.22–5.81)
Total Bilirubin: 0.4 mg/dL (ref 0.3–1.2)
WBC: 8 10*3/uL (ref 4.0–10.5)

## 2009-01-09 ENCOUNTER — Telehealth: Payer: Self-pay | Admitting: Internal Medicine

## 2009-01-16 ENCOUNTER — Encounter: Payer: Self-pay | Admitting: Licensed Clinical Social Worker

## 2009-01-16 ENCOUNTER — Ambulatory Visit: Payer: Self-pay | Admitting: Internal Medicine

## 2009-01-19 ENCOUNTER — Encounter: Payer: Self-pay | Admitting: Internal Medicine

## 2009-01-24 ENCOUNTER — Other Ambulatory Visit: Payer: Self-pay | Admitting: Emergency Medicine

## 2009-01-24 ENCOUNTER — Ambulatory Visit: Payer: Self-pay | Admitting: Internal Medicine

## 2009-01-24 ENCOUNTER — Encounter (INDEPENDENT_AMBULATORY_CARE_PROVIDER_SITE_OTHER): Payer: Self-pay | Admitting: Emergency Medicine

## 2009-01-24 ENCOUNTER — Ambulatory Visit: Payer: Self-pay | Admitting: *Deleted

## 2009-01-24 ENCOUNTER — Inpatient Hospital Stay (HOSPITAL_COMMUNITY): Admission: EM | Admit: 2009-01-24 | Discharge: 2009-02-04 | Payer: Self-pay | Admitting: Internal Medicine

## 2009-01-24 ENCOUNTER — Emergency Department (HOSPITAL_COMMUNITY): Admission: EM | Admit: 2009-01-24 | Discharge: 2009-01-24 | Payer: Self-pay | Admitting: Emergency Medicine

## 2009-01-24 ENCOUNTER — Encounter: Payer: Self-pay | Admitting: Internal Medicine

## 2009-02-02 ENCOUNTER — Ambulatory Visit: Payer: Self-pay | Admitting: Physical Medicine & Rehabilitation

## 2009-04-23 ENCOUNTER — Encounter: Payer: Self-pay | Admitting: Licensed Clinical Social Worker

## 2009-04-30 ENCOUNTER — Telehealth: Payer: Self-pay | Admitting: Licensed Clinical Social Worker

## 2009-05-08 ENCOUNTER — Telehealth: Payer: Self-pay | Admitting: Internal Medicine

## 2009-05-08 ENCOUNTER — Encounter: Payer: Self-pay | Admitting: Internal Medicine

## 2009-06-05 ENCOUNTER — Telehealth: Payer: Self-pay | Admitting: Internal Medicine

## 2009-06-23 ENCOUNTER — Telehealth: Payer: Self-pay | Admitting: Internal Medicine

## 2009-06-25 ENCOUNTER — Telehealth: Payer: Self-pay | Admitting: *Deleted

## 2009-06-29 ENCOUNTER — Telehealth: Payer: Self-pay | Admitting: Internal Medicine

## 2009-07-01 ENCOUNTER — Encounter: Payer: Self-pay | Admitting: Internal Medicine

## 2009-07-14 ENCOUNTER — Telehealth: Payer: Self-pay | Admitting: Internal Medicine

## 2009-07-21 ENCOUNTER — Telehealth: Payer: Self-pay | Admitting: *Deleted

## 2009-07-22 ENCOUNTER — Telehealth: Payer: Self-pay | Admitting: Internal Medicine

## 2009-08-17 ENCOUNTER — Telehealth: Payer: Self-pay | Admitting: Internal Medicine

## 2009-08-26 ENCOUNTER — Telehealth: Payer: Self-pay | Admitting: Internal Medicine

## 2009-08-27 ENCOUNTER — Emergency Department (HOSPITAL_COMMUNITY): Admission: EM | Admit: 2009-08-27 | Discharge: 2009-08-27 | Payer: Self-pay | Admitting: Emergency Medicine

## 2009-08-27 ENCOUNTER — Encounter: Payer: Self-pay | Admitting: Internal Medicine

## 2009-08-31 ENCOUNTER — Encounter: Payer: Self-pay | Admitting: Internal Medicine

## 2009-09-01 ENCOUNTER — Ambulatory Visit: Payer: Self-pay | Admitting: Internal Medicine

## 2009-09-01 ENCOUNTER — Telehealth: Payer: Self-pay | Admitting: *Deleted

## 2009-09-11 ENCOUNTER — Ambulatory Visit: Payer: Self-pay | Admitting: Internal Medicine

## 2009-09-11 LAB — CONVERTED CEMR LAB
CO2: 26 meq/L (ref 19–32)
Chloride: 103 meq/L (ref 96–112)
Glucose, Bld: 97 mg/dL (ref 70–99)
MCV: 94.6 fL (ref >=78.0)
Potassium: 4.5 meq/L (ref 3.5–5.3)
RBC: 4.44 M/uL (ref 4.22–5.81)
Sodium: 141 meq/L (ref 135–145)
WBC: 6.5 10*3/uL (ref 4.0–10.5)

## 2009-09-18 ENCOUNTER — Telehealth: Payer: Self-pay | Admitting: Internal Medicine

## 2009-09-21 ENCOUNTER — Telehealth: Payer: Self-pay | Admitting: Internal Medicine

## 2009-09-22 ENCOUNTER — Telehealth: Payer: Self-pay | Admitting: Internal Medicine

## 2009-09-28 ENCOUNTER — Encounter: Payer: Self-pay | Admitting: Internal Medicine

## 2009-10-06 ENCOUNTER — Telehealth: Payer: Self-pay | Admitting: Internal Medicine

## 2009-10-16 ENCOUNTER — Ambulatory Visit: Payer: Self-pay | Admitting: Internal Medicine

## 2009-10-26 ENCOUNTER — Telehealth: Payer: Self-pay | Admitting: Internal Medicine

## 2009-11-24 ENCOUNTER — Ambulatory Visit: Payer: Self-pay | Admitting: Internal Medicine

## 2009-12-15 ENCOUNTER — Telehealth: Payer: Self-pay | Admitting: Internal Medicine

## 2009-12-18 ENCOUNTER — Telehealth: Payer: Self-pay | Admitting: Internal Medicine

## 2009-12-24 ENCOUNTER — Encounter: Payer: Self-pay | Admitting: Internal Medicine

## 2009-12-25 ENCOUNTER — Telehealth: Payer: Self-pay | Admitting: Internal Medicine

## 2009-12-29 ENCOUNTER — Encounter: Payer: Self-pay | Admitting: Internal Medicine

## 2010-01-07 ENCOUNTER — Encounter: Payer: Self-pay | Admitting: Internal Medicine

## 2010-01-25 ENCOUNTER — Encounter: Payer: Self-pay | Admitting: Internal Medicine

## 2010-01-25 ENCOUNTER — Telehealth: Payer: Self-pay | Admitting: Internal Medicine

## 2010-02-25 ENCOUNTER — Telehealth: Payer: Self-pay | Admitting: Internal Medicine

## 2010-02-25 ENCOUNTER — Encounter: Payer: Self-pay | Admitting: Internal Medicine

## 2010-03-11 ENCOUNTER — Encounter: Payer: Self-pay | Admitting: Internal Medicine

## 2010-03-19 ENCOUNTER — Encounter: Payer: Self-pay | Admitting: Licensed Clinical Social Worker

## 2010-03-19 ENCOUNTER — Ambulatory Visit: Payer: Self-pay | Admitting: Internal Medicine

## 2010-03-19 LAB — CONVERTED CEMR LAB
BUN: 31 mg/dL — ABNORMAL HIGH (ref 6–23)
CO2: 29 meq/L (ref 19–32)
Chloride: 102 meq/L (ref 96–112)
Creatinine, Ser: 1.47 mg/dL (ref 0.40–1.50)
Glucose, Bld: 88 mg/dL (ref 70–99)
Potassium: 4.3 meq/L (ref 3.5–5.3)

## 2010-03-27 ENCOUNTER — Telehealth: Payer: Self-pay | Admitting: Internal Medicine

## 2010-03-27 ENCOUNTER — Encounter: Payer: Self-pay | Admitting: Internal Medicine

## 2010-03-29 ENCOUNTER — Telehealth: Payer: Self-pay | Admitting: Internal Medicine

## 2010-03-30 ENCOUNTER — Encounter: Payer: Self-pay | Admitting: Internal Medicine

## 2010-04-08 ENCOUNTER — Telehealth: Payer: Self-pay | Admitting: Internal Medicine

## 2010-04-13 ENCOUNTER — Encounter: Payer: Self-pay | Admitting: Internal Medicine

## 2010-04-21 ENCOUNTER — Telehealth: Payer: Self-pay | Admitting: Internal Medicine

## 2010-04-30 ENCOUNTER — Telehealth: Payer: Self-pay | Admitting: *Deleted

## 2010-05-21 ENCOUNTER — Ambulatory Visit: Payer: Self-pay | Admitting: Internal Medicine

## 2010-05-21 ENCOUNTER — Ambulatory Visit (HOSPITAL_COMMUNITY)
Admission: RE | Admit: 2010-05-21 | Discharge: 2010-05-21 | Payer: Self-pay | Source: Home / Self Care | Admitting: Internal Medicine

## 2010-05-21 ENCOUNTER — Encounter: Payer: Self-pay | Admitting: Licensed Clinical Social Worker

## 2010-05-21 DIAGNOSIS — M1610 Unilateral primary osteoarthritis, unspecified hip: Secondary | ICD-10-CM | POA: Insufficient documentation

## 2010-05-21 HISTORY — DX: Unilateral primary osteoarthritis, unspecified hip: M16.10

## 2010-05-21 LAB — CONVERTED CEMR LAB
Calcium: 9.2 mg/dL (ref 8.4–10.5)
Glucose, Bld: 131 mg/dL — ABNORMAL HIGH (ref 70–99)

## 2010-05-22 ENCOUNTER — Encounter: Payer: Self-pay | Admitting: Internal Medicine

## 2010-05-25 ENCOUNTER — Telehealth: Payer: Self-pay | Admitting: Licensed Clinical Social Worker

## 2010-05-26 ENCOUNTER — Ambulatory Visit (HOSPITAL_COMMUNITY)
Admission: RE | Admit: 2010-05-26 | Discharge: 2010-05-26 | Payer: Self-pay | Source: Home / Self Care | Attending: Internal Medicine | Admitting: Internal Medicine

## 2010-05-27 ENCOUNTER — Telehealth: Payer: Self-pay | Admitting: Internal Medicine

## 2010-06-02 ENCOUNTER — Telehealth: Payer: Self-pay | Admitting: *Deleted

## 2010-06-17 ENCOUNTER — Telehealth: Payer: Self-pay | Admitting: Internal Medicine

## 2010-06-17 ENCOUNTER — Encounter: Payer: Self-pay | Admitting: Internal Medicine

## 2010-06-25 ENCOUNTER — Encounter: Payer: Self-pay | Admitting: Internal Medicine

## 2010-06-30 ENCOUNTER — Telehealth: Payer: Self-pay | Admitting: Internal Medicine

## 2010-07-14 ENCOUNTER — Telehealth: Payer: Self-pay | Admitting: Internal Medicine

## 2010-07-20 NOTE — Progress Notes (Signed)
Summary: med refill/gp  Phone Note Refill Request Message from:  Fax from Pharmacy on April 21, 2010 10:26 AM  Refills Requested: Medication #1:  VICODIN ES 7.5-750 MG TABS Take 1 tablet by mouth three times a day..   Last Refilled: 03/29/2010 Last appt/ 03/19/10.   Method Requested: Telephone to Pharmacy Initial call taken by: Morrison Old RN,  April 21, 2010 10:26 AM  Follow-up for Phone Call        He is not on this.  It was a one-time order when his methadone was suspended. Follow-up by: Milta Deiters MD,  April 21, 2010 3:25 PM  Additional Follow-up for Phone Call Additional follow up Details #1::        [facility] awared of Vicodin being a onetime order. Additional Follow-up by: Morrison Old RN,  April 22, 2010 8:56 AM

## 2010-07-20 NOTE — Assessment & Plan Note (Signed)
Summary: FU VISIT/DS   Vital Signs:  Patient profile:   77 year old male Height:      65 inches (165.10 cm) Weight:      175.04 pounds (79.56 kg) BMI:     29.23 Temp:     97.3 degrees F (36.28 degrees C) oral Pulse rate:   70 / minute BP sitting:   156 / 83  (right arm)  Vitals Entered By: Sander Nephew RN (March 19, 2010 10:04 AM) CC: Depression Is Patient Diabetic? No Pain Assessment Patient in pain? yes     Location: head.leg Intensity: 6 Type: aching Onset of pain  Constant Nutritional Status BMI of 25 - 29 = overweight  Have you ever been in a relationship where you felt threatened, hurt or afraid?No   Does patient need assistance? Functional Status Self care Ambulation Impaired:Risk for fall Comments Skin Cancer.  Lesions in his head.  Nurse at the home thought he may have had a stroke.  Happened a week or so ago.  Mouth is twisted.  Pain on left side of face.  Said his leg went out.  Stomach pain level 5-6 gets worse at times.  Michela Pitcher things are better here today.  Leg pain all the time.   Chest pain.  Dislikes where he is.  Does not get much peaceful sleep.  Said that he has no appetite.  Food not very good.  Peeling on hands and bruising on hands as well.   Primary Care Aren Pryde:  Milta Deiters MD  CC:  Depression.  History of Present Illness: 89 man with depression .  Most of this visit, as usual, was his angst over his life circumstances.  He has no relatives who care about him and are healthy enough to help out.  He is at an assisted living place and forced to share a room with someone he dislikes.  The problem is that Mr. Dralle has had repeated problems with other people in recent years, and it is not likely that changing roommates would help.  He has no new or active clinical problems.   I will have William Dalton SW see him to try to clarify some of his misunderstandings about his status.  Depression History:      The patient is having a depressed mood  most of the day and has a diminished interest in his usual daily activities.        The patient denies that he feels like life is not worth living, denies that he wishes that he were dead, and denies that he has thought about ending his life.        Comments:  Has been depressed mostly due to living conditions.   Preventive Screening-Counseling & Management  Alcohol-Tobacco     Alcohol drinks/day: 0     Smoking Status: quit     Year Quit: 1980     Passive Smoke Exposure: no  Allergies: No Known Drug Allergies  Physical Exam  General:  well-developed.   Eyes:  anicteric.  nl injection Neck:  no thyromegaly and no carotid bruits.   Lungs:  normal respiratory effort, no crackles, and no wheezes.   Heart:  normal rate, regular rhythm, and no murmur.   Abdomen:  soft, non-tender, and no masses.   Extremities:  no edema.   Impression & Recommendations:  Problem # 1:  HYPERTENSION (ICD-401.9)  nearly normal.  will check routine labs.   His updated medication list for this problem includes:  Benazepril Hcl 40 Mg Tabs (Benazepril hcl) .Marland Kitchen... Take 1 tablet by mouth once a day    Amlodipine Besylate 5 Mg Tabs (Amlodipine besylate) .Marland Kitchen... Take 1 tablet by mouth once a day    Furosemide 20 Mg Tabs (Furosemide) .Marland Kitchen... Take 1 tablet by mouth once a day  Orders: T-Basic Metabolic Panel ([id number])  Problem # 2:  DEPRESSION (ICD-311) This is most of his problems.  Most of this is accumulative losses and lonliness.   His updated medication list for this problem includes:    Wellbutrin 100 Mg Tabs (Bupropion hcl) .Marland Kitchen... Take 1 tablet three times a day    Chlordiazepoxide Hcl 25 Mg Caps (Chlordiazepoxide hcl) .Marland Kitchen... Take 1 capsule by mouth two times a day  Complete Medication List: 1)  Aspirin 81 Mg Tbec (Aspirin) .... Once daily 2)  Zocor 20 Mg Tabs (Simvastatin) .... Take 1 tablet by mouth once a day 3)  Methadone Hcl 5 Mg Tabs (Methadone hcl) .... Take one tablet every 8 hours 4)   Prilosec Otc 20 Mg Tbec (Omeprazole magnesium) .... Take 1 tablet by mouth once a day 5)  Miralax Powd (Polyethylene glycol 3350) .... Use as directed 6)  Wellbutrin 100 Mg Tabs (Bupropion hcl) .... Take 1 tablet three times a day 7)  Benazepril Hcl 40 Mg Tabs (Benazepril hcl) .... Take 1 tablet by mouth once a day 8)  Flomax 0.4 Mg Xr24h-cap (Tamsulosin hcl) .... Take 1 tablet by mouth once a day 9)  Amlodipine Besylate 5 Mg Tabs (Amlodipine besylate) .... Take 1 tablet by mouth once a day 10)  Furosemide 20 Mg Tabs (Furosemide) .... Take 1 tablet by mouth once a day 11)  Senokot S 8.6-50 Mg Tabs (Sennosides-docusate sodium) .... Take 4 tabs hs every night 12)  Tab-a-vite Tabs (Multiple vitamin) .Marland Kitchen.. 1 tablet by mouth daily 13)  Chlordiazepoxide Hcl 25 Mg Caps (Chlordiazepoxide hcl) .... Take 1 capsule by mouth two times a day  Other Orders: Influenza Vaccine MCR [id number])  Patient Instructions: 1)  Please schedule a follow-up appointment in 3 months.   Vital Signs:  Patient profile:   77 year old male Height:      65 inches (165.10 cm) Weight:      175.04 pounds (79.56 kg) BMI:     29.23 Temp:     97.3 degrees F (36.28 degrees C) oral Pulse rate:   70 / minute BP sitting:   156 / 83  (right arm)  Vitals Entered By: Sander Nephew RN (March 19, 2010 10:04 AM)    Prevention & Chronic Care Immunizations   Influenza vaccine: Fluvax MCR  (03/19/2010)    Tetanus booster: Not documented    Pneumococcal vaccine: Pneumovax (State)  (04/30/2007)    H. zoster vaccine: Not documented  Colorectal Screening   Hemoccult: Not documented   Hemoccult action/deferral: Not indicated  (09/11/2009)    Colonoscopy: Location:  [facility].    (11/13/2008)   Colonoscopy action/deferral: Not indicated  (01/16/2009)  Other Screening   PSA: Not documented   PSA action/deferral: Not indicated  (09/11/2009)   Smoking status: quit  (03/19/2010)  Lipids   Total Cholesterol: 139   (12/03/2007)   Lipid panel action/deferral: Deferred   LDL: 68  (12/03/2007)   LDL Direct: Not documented   HDL: 40  (12/03/2007)   Triglycerides: 153  (12/03/2007)   Lipid panel due: 09/12/2010  Hypertension   Last Blood Pressure: 156 / 83  (03/19/2010)   Serum creatinine: 1.33  (09/11/2009)  Serum potassium 4.5  (09/11/2009)  Self-Management Support :   Personal Goals (by the next clinic visit) :      Personal blood pressure goal: 140/90  (09/11/2009)   Patient will work on the following items until the next clinic visit to reach self-care goals:     Medications and monitoring: take my medicines every day, bring all of my medications to every visit  (03/19/2010)     Eating: drink diet soda or water instead of juice or soda, eat more vegetables, use fresh or frozen vegetables, eat foods that are low in salt, eat baked foods instead of fried foods, eat fruit for snacks and desserts, limit or avoid alcohol  (03/19/2010)     Activity: take a 30 minute walk every day  (03/19/2010)     Other: PATIENT NOT EATING WELL  (09/01/2009)    Hypertension self-management support: Written self-care plan, Education handout, Pre-printed educational material, Resources for patients handout  (03/19/2010)   Hypertension self-care plan printed.   Hypertension education handout printed      Resource handout printed.  Process Orders Check Orders Results:     [facility]: Check successful Tests Sent for requisitioning (March 19, 2010 12:25 PM):     03/19/2010: [facility] -- T-Basic Metabolic Panel 0000000 (signed)      Immunizations Administered:  Influenza Vaccine # 1:    Vaccine Type: Fluvax MCR    Site: right deltoid    Mfr: GlaxoSmithKline    Dose: 0.5 ml    Route: IM    Given by: Sander Nephew RN    Exp. Date: 12/18/2010    Lot #: [id number]    VIS given: 01/12/10 version given March 19, 2010.  Flu Vaccine Consent Questions:    Do you  have a history of severe allergic reactions to this vaccine? no    Any prior history of allergic reactions to egg and/or gelatin? no    Do you have a sensitivity to the preservative Thimersol? no    Do you have a past history of Guillan-Barre Syndrome? no    Do you currently have an acute febrile illness? no    Have you ever had a severe reaction to latex? no    Vaccine information given and explained to patient? yes

## 2010-07-20 NOTE — Progress Notes (Signed)
Summary: Refill/gh  Phone Note Refill Request Message from:  Fax from Pharmacy on Oct 26, 2009 11:32 AM  Refills Requested: Medication #1:  Promod/ Protein Powder Mix 1 scoop with applesauce 3 times a day   Last Refilled: 07/20/2009  Method Requested: Electronic Initial call taken by: Sander Nephew RN,  Oct 26, 2009 11:33 AM  Follow-up for Phone Call        please order this until the end of time. Follow-up by: Milta Deiters MD,  Oct 27, 2009 3:32 PM  Additional Follow-up for Phone Call Additional follow up Details #1::        Rx called to pharmacy Additional Follow-up by: Sander Nephew RN,  Nov 05, 2009 12:17 PM

## 2010-07-20 NOTE — Assessment & Plan Note (Signed)
Summary: ed F/U/GG   PCP ROGERS   Vital Signs:  Patient profile:   77 year old male Height:      65 inches Weight:      179.7 pounds BMI:     30.01 Temp:     97.1 degrees F oral Pulse rate:   73 / minute BP sitting:   161 / 87  (right arm)  Vitals Entered By: Silverio Decamp NT II (September 01, 2009 9:51 AM) CC: ER FOLLOW-UP Is Patient Diabetic? No Pain Assessment Patient in pain? yes     Location: hip Intensity: 8 Type: aching Onset of pain  Constant Nutritional Status BMI of > 30 = obese  Does patient need assistance? Functional Status Self care Ambulation Normal   Primary Care Provider:  Milta Deiters MD  CC:  ER FOLLOW-UP.  History of Present Illness: Zachary [name] is a 77 yo M with PMH of diffuse joint pain and depression who presents for ED follow-up after he went to the ED on 3/10 for joint pain. He says he hasn't been able to take his methadone in 3 days because he didn't have the rx (though per clinic it doesn't look like it's been refilled since November), and he is "hurting all over." He notes mild swelling of his R knee as well. He also reports dizziness x 3-4 weeks. It can happen anytim, even when he's just sitting, and he says the room can spin. Turning or raising his head makes the dizziness worse.   Perhaps most importantly, he says he has been very depressed in the past several months. He has last 25-30 lbs in 6-8 months (according to clinic records, he has lost 38 lbs since July 2010) as he has no appetite for food anymore. He says "I am depressed to no end" and "I have no one to depend on." He also reports having thoughts of "What am I living for?" He lives in an ALF with an apartment-mate but cannot interact with him much since his roommate is hard of hearing, and he says he has no desire to go to the ALF's community center and be social, despite the fact that he used to be a very social person.   Current Medications (verified): 1)  Aspirin 81 Mg Tbec (Aspirin)  .... Once Daily 2)  Zocor 20 Mg Tabs (Simvastatin) .... Take 1 Tablet By Mouth Once A Day 3)  Methadone Hcl 5 Mg Tabs (Methadone Hcl) .... Take One Tablet Every 8 Hours As Needed Pain 4)  Prilosec Otc 20 Mg Tbec (Omeprazole Magnesium) .... Take 1 Tablet By Mouth Once A Day 5)  Miralax   Powd (Polyethylene Glycol 3350) .... Use As Directed 6)  Wellbutrin 100 Mg Tabs (Bupropion Hcl) .... Take 1 Tablet Daily 7)  Benazepril Hcl 40 Mg  Tabs (Benazepril Hcl) .... Take 1 Tablet By Mouth Once A Day 8)  Flomax 0.4 Mg  Xr24h-Cap (Tamsulosin Hcl) .... Take 1 Tablet By Mouth Once A Day 9)  Amlodipine Besylate 5 Mg Tabs (Amlodipine Besylate) .... Take 1 Tablet By Mouth Once A Day 10)  Hydrocodone-Acetaminophen 5-500 Mg Tabs (Hydrocodone-Acetaminophen) .... Take 1 Every 6 Hours As Needed Pain 11)  Furosemide 20 Mg Tabs (Furosemide) .... Take 1 Tablet By Mouth Once A Day  Allergies (verified): No Known Drug Allergies  Past History:  Past Medical History: Last updated: 06/22/2006 Anxiety Depression Diverticulosis, colon GERD Hypertension Chronic pain Alcoholism Weight loss IBS BPH Skin cancer, hx of  Social History: Last updated:  10/30/2006 Zachary [name] has been divorced for decades.  He has 2 adult children probably in [place] with no contact.  Had a sig other who died a few years ago. He lives in a garage apartment behind his sister's house in [place].  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  Maintains some contact with his sister and some nephews, but seems to have a lonely existence.  History has included smoking and periods of drinking (details of this are unclear to me). He has Medicare and Medicaid.  Risk Factors: Alcohol Use: 0 (10/17/2008) Exercise: no (10/17/2008)  Risk Factors: Smoking Status: quit (01/01/2009) Passive Smoke Exposure: no (10/17/2008)  Review of Systems      See  HPI  Physical Exam  General:  Well-developed,well-nourished,in no acute distress; alert,appropriate and cooperative throughout examination Head:  Normocephalic and atraumatic without obvious abnormalities. No apparent alopecia or balding. Eyes:  PERRLA. Horizontal nystagmus noted when patient looks to left. Neck:  Normal ROM, no swelling Extremities:  mild swelling R knee, tender. No warmth or erythema. Normal ROM but limited by pain. Neurologic:  alert & oriented X3.   Psych:  Oriented X3. Interactive with good eye contact but depressed-appearing.   Impression & Recommendations:  Problem # 1:  DEPRESSION (ICD-311) I am very concerned about Zachary. Roering depression, as he is in a difficult situation at his ALF with little to no social support. He has lost a significant amount of weight since July, likely 2/2 his depression. He previously on Paxil 40 mg and is now on Paxil 20 mg, but I don't think it's helping him. I spoke with Dr. Stann Mainland about this issue and we will change his medication to Wellbutrin, as this will improve both the depression as well as his appetite. I have started him off on a very low dose considering his age and diminished renal function. He will see Dr. Stann Mainland on March 25, and an increase in his medication can be made then if necessary.  His updated medication list for this problem includes:    Wellbutrin 100 Mg Tabs (Bupropion hcl) .Marland Kitchen... Take 1 tablet daily  Problem # 2:  DIZZINESS (ICD-780.4) He has a long-standing h/o dizziness, which it looks like previously it may have been associated with urination. He is not hypotensive. This may be a side effect of his medication(s). Will switch his Paxil to Wellbutrin as noted in #1 and see if this improves his dizziness any.  Problem # 3:  PAIN, CHRONIC NEC (ICD-338.29) Patient with "pain all over", consistent with his previous symptoms and in the context of not having received his methadone in at least 3 days (if not longer,  as it doesn't look like it was refilled since 11/10). Examination did not reveal any gross abnormalities. Will continue with methadone for pain control. He will see Dr. Stann Mainland on March 25.  Complete Medication List: 1)  Aspirin 81 Mg Tbec (Aspirin) .... Once daily 2)  Zocor 20 Mg Tabs (Simvastatin) .... Take 1 tablet by mouth once a day 3)  Methadone Hcl 5 Mg Tabs (Methadone hcl) .... Take one tablet every 8 hours as needed pain 4)  Prilosec Otc 20 Mg Tbec (Omeprazole magnesium) .... Take 1 tablet by mouth once a day 5)  Miralax Powd (Polyethylene glycol 3350) .... Use as directed 6)  Wellbutrin 100 Mg Tabs (Bupropion hcl) .... Take 1 tablet daily 7)  Benazepril Hcl  40 Mg Tabs (Benazepril hcl) .... Take 1 tablet by mouth once a day 8)  Flomax 0.4 Mg Xr24h-cap (Tamsulosin hcl) .... Take 1 tablet by mouth once a day 9)  Amlodipine Besylate 5 Mg Tabs (Amlodipine besylate) .... Take 1 tablet by mouth once a day 10)  Hydrocodone-acetaminophen 5-500 Mg Tabs (Hydrocodone-acetaminophen) .... Take 1 every 6 hours as needed pain 11)  Furosemide 20 Mg Tabs (Furosemide) .... Take 1 tablet by mouth once a day  Patient Instructions: 1)  You have an appointment with Dr. Stann Mainland on March 25 @ 11 am. 2)  Please stop taking the Paxil. I have changed this medication to Wellbutrin, which I believe will help your depression. Take one tablet everyday. Prescriptions: WELLBUTRIN 100 MG TABS (BUPROPION HCL) Take 1 tablet daily  #30 x 03   Entered and Authorized by:   Neta Mends MD   Signed by:   Neta Mends MD on 09/01/2009   Method used:   Print then Give to Patient   RxID:   [id number]    Prevention & Chronic Care Immunizations   Influenza vaccine: Fluvax MCR  (03/18/2008)    Tetanus booster: Not documented    Pneumococcal vaccine: Pneumovax (State)  (04/30/2007)    H. zoster vaccine: Not documented  Colorectal Screening   Hemoccult: Not documented    Colonoscopy: Location:  [facility].    (11/13/2008)   Colonoscopy action/deferral: Not indicated  (01/16/2009)  Other Screening   PSA: Not documented   Smoking status: quit  (01/01/2009)  Lipids   Total Cholesterol: 139  (12/03/2007)   Lipid panel action/deferral: Deferred   LDL: 68  (12/03/2007)   LDL Direct: Not documented   HDL: 40  (12/03/2007)   Triglycerides: 153  (12/03/2007)  Hypertension   Last Blood Pressure: 161 / 87  (09/01/2009)   Serum creatinine: 1.64  (01/01/2009)   Serum potassium 4.7  (01/01/2009)  Self-Management Support :    Patient will work on the following items until the next clinic visit to reach self-care goals:     Medications and monitoring: bring all of my medications to every visit  (09/01/2009)     Other: PATIENT NOT EATING WELL  (09/01/2009)    Hypertension self-management support: Not documented

## 2010-07-20 NOTE — Progress Notes (Signed)
Summary: Medication  Phone Note Refill Request Message from:  Fax from Pharmacy on April 08, 2010 10:31 AM  Refills Requested: Medication #1:  VICODIN ES 7.5-750 MG TABS Take 1 tablet by mouth three times a day.. Fax from [facility] is refusing to take the Vicodin as schedule.  Facility would like a as needed order.  Fax to [phone].   Method Requested: Fax to [place] Initial call taken by: Sander Nephew RN,  April 08, 2010 10:33 AM  Follow-up for Phone Call        The vicodin was a one-time temporary order when his methadone had not been refilled.  He is not on vicodin anymore. Follow-up by: Milta Deiters MD,  April 09, 2010 9:55 AM  Additional Follow-up for Phone Call Additional follow up Details #1::        Rx faxed to pharmacy.  Denial was faxed to the Pt's living facility. Additional Follow-up by: Sander Nephew RN,  April 13, 2010 3:30 PM

## 2010-07-20 NOTE — Progress Notes (Signed)
Summary: refill/ hla  Phone Note Refill Request Message from:  Patient on March 29, 2010 9:46 AM  Refills Requested: Medication #1:  METHADONE HCL 5 MG TABS Take one tablet every 8 hours   Dosage confirmed as above?Dosage Confirmed   Last Refilled: 9/8 Initial call taken by: Freddy Finner RN,  March 29, 2010 9:46 AM  Follow-up for Phone Call        done now Follow-up by: Milta Deiters MD,  March 30, 2010 2:53 PM

## 2010-07-20 NOTE — Progress Notes (Signed)
Summary: phone/gg  Phone Note From Other Clinic   Summary of Call: Received fax from [facility] with [facility]  stating resident is  complaining with pain in left hip.  They request order for xray and as needed order for vicodin for pain. I called to clarify and informed Care coordinator pt has order for as needed vicodin.  I asked if pt had fall or injury and she said no, just c/o pain to his hip. Do you want to see pt or order xray? Initial call taken by: Gevena Cotton RN,  July 14, 2009 9:07 AM  Follow-up for Phone Call        Pt had hip replacement on 8/10 by Dr Patton Salles.  His office called and he will see pt for hip pain.  Mary at [facility] place called and she will arrange visit with Dr Mayer Camel. Pt also scheuled appointment with Dr Stann Mainland for next month. Follow-up by: Gevena Cotton RN,  July 14, 2009 2:06 PM

## 2010-07-20 NOTE — Progress Notes (Signed)
Summary: Medication Clarification  Phone Note Refill Request Message from:  Fax from Pharmacy on August 17, 2009 10:32 AM  Refills Requested: Medication #1:  METHADONE HCL 5 MG TABS Take one tablet every 8 hours as needed pain Fax from pharmacy said that order sent on 07/24/2009 says Methadone 5 mg tablets as needed for pain.  Do you want the Methadone as needed or scheduled. Pt is at [facility].   Method Requested: Fax to [place] Initial call taken by: Sander Nephew RN,  August 17, 2009 10:35 AM  Follow-up for Phone Call        The methadone can be as needed so long as he does not exceed one dose every 8 hours. Follow-up by: Milta Deiters MD,  August 18, 2009 2:41 PM  Additional Follow-up for Phone Call Additional follow up Details #1::        Response was faxed  Additional Follow-up by: Sander Nephew RN,  August 19, 2009 1:57 PM

## 2010-07-20 NOTE — Assessment & Plan Note (Signed)
Summary: Soc. Work  30 minutes.  Counseling and support by social work.  Zachary [name] continues to be unhappy about living at [facility] and is not able to join in with the other residents or participate in activities.  He is also angry about having to give up his Soc. Security check to reside there and feels like this was not explained in full to him when he transferred from rehab to [facility].    Zachary [name] has no outside social supports.  He is estranged from his sister and nephew.  He talks about a friend in [place] who has not been involved in his care.   Currently Zachary [name] is getting a new pair of bifocal glasses which should help him see better.  He continues to work with [facility] re: his vision issues. He does not have his own phone at [facility].   I've asked Zachary [name] if he would like to explore other assisted living opportunities and he is not receptive to that option but would like to live independently again.   He has multiple health issues including low vision, no family support, and financial issues that would prevent him from living independently at this time.

## 2010-07-20 NOTE — Progress Notes (Signed)
Summary: address for scripts/ hla  Phone Note From Pharmacy   Summary of Call: controlled substances are to be faxed when script is written to [phone] then mailed to [facility] [address] [place] [address] Initial call taken by: Freddy Finner RN,  September 01, 2009 10:08 AM

## 2010-07-20 NOTE — Progress Notes (Signed)
Summary: refill/ hla  Phone Note Refill Request Message from:  Fax from Pharmacy on December 25, 2009 5:04 PM  Refills Requested: Medication #1:  FLOMAX 0.4 MG  XR24H-CAP Take 1 tablet by mouth once a day   Last Refilled: 6/9 Initial call taken by: Freddy Finner RN,  December 25, 2009 5:04 PM    Prescriptions: FLOMAX 0.4 MG  XR24H-CAP (TAMSULOSIN HCL) Take 1 tablet by mouth once a day  #30 x 11   Entered and Authorized by:   Milta Deiters MD   Signed by:   Milta Deiters MD on 12/25/2009   Method used:   Electronically to        [facility] (retail)       [address], [place]  [address]       Ph: [phone]       Fax: [phone]   [place]:   [phone]

## 2010-07-20 NOTE — Medication Information (Signed)
Summary: VIDCODIN  VIDCODIN   Imported By: Garlan Fillers 05/10/2010 15:25:29  _____________________________________________________________________  External Attachment:    Type:   Image     Comment:   External Document

## 2010-07-20 NOTE — Progress Notes (Signed)
Summary: methadone/ hla  Phone Note Outgoing Call   Summary of Call: spoke w/ [facility] at [facility], pt's methadone was filled yesterday from 1 of the 3 scripts that was mailed in october [facility] ph# [phone] Initial call taken by: Freddy Finner RN,  April 30, 2010 11:37 AM

## 2010-07-20 NOTE — Medication Information (Signed)
Summary: METHADONE  METHADONE   Imported By: Garlan Fillers 12/28/2009 09:11:33  _____________________________________________________________________  External Attachment:    Type:   Image     Comment:   External Document

## 2010-07-20 NOTE — Progress Notes (Signed)
Summary: med refill/gp  Phone Note Refill Request Message from:  Fax from Pharmacy on September 21, 2009 3:33 PM  Request refill  for Artificial Tears ointment (Duratears)  Use as directed in left eye at bedtime.  Qty. 3.5  Last refill 07/22/09     Method Requested: Telephone to Pharmacy Initial call taken by: Morrison Old RN,  September 21, 2009 3:33 PM  Follow-up for Phone Call        Please try to telephone this to the pharmacy.  May use as needed until the end of time.  I cannot find duratears in the EMR. Follow-up by: Milta Deiters MD,  September 22, 2009 10:00 AM  Additional Follow-up for Phone Call Additional follow up Details #1::        Rx faxed to pharmacy Additional Follow-up by: Sander Nephew RN,  September 22, 2009 10:46 AM

## 2010-07-20 NOTE — Progress Notes (Signed)
Summary: refill/ hla  Phone Note Refill Request Message from:  Fax from Pharmacy on July 22, 2009 4:43 PM  Refills Requested: Medication #1:  METHADONE HCL 5 MG TABS Take one tablet every 8 hours as needed pain Initial call taken by: Freddy Finner RN,  July 22, 2009 4:43 PM  Follow-up for Phone Call        Patient's PCP Dr. Stann Mainland requested that I write the methadone prescription for Mr. Feltus; prescription written and signed, nurse to fax. Follow-up by: Bertha Stakes MD,  July 24, 2009 5:18 PM    Prescriptions: METHADONE HCL 5 MG TABS (METHADONE HCL) Take one tablet every 8 hours as needed pain  #90 x 0   Entered and Authorized by:   Bertha Stakes MD   Signed by:   Bertha Stakes MD on 07/24/2009   Method used:   Printed then faxed to ...       [facility] (retail)       [address], [place]  [address]       Ph: [phone]       Fax: [phone]   RxID:   [id number]

## 2010-07-20 NOTE — Medication Information (Signed)
Summary: RX Folder  RX Folder   Imported By: Lacy Duverney 01/28/2010 10:01:32  _____________________________________________________________________  External Attachment:    Type:   Image     Comment:   External Document

## 2010-07-20 NOTE — Medication Information (Signed)
Summary: Visual merchandiser   Imported By: Bonner Puna 09/02/2009 16:08:55  _____________________________________________________________________  External Attachment:    Type:   Image     Comment:   External Document

## 2010-07-20 NOTE — Assessment & Plan Note (Signed)
Summary: Soc. Work  Soc. Work.  Counseling and Support.   Elderly gentleman with multiple health and MH issues.    Mr. Bragg continues to struggle in adjusting to life at [facility].  He basically has no involved family or friends and has made few friends nor joined in at any activities at [facility].   I see that he now has bifocal glasses which should improve his vision.   Listened to patient's story and concerns.   He tells me he does not get any spending money at all from [facility] and he is uncertain as to why.  He tells me he cannot get anywhere for shopping or errands.   He tells me he is so use to doing things on his own that it is very uncomfortable for him to share a room or share meals with other residents.   Holiday bag and blanket given to patient today for Christmas.    The plan is to call [facility] for further psychosocial/financial planning.

## 2010-07-20 NOTE — Medication Information (Signed)
Summary: METHADONE/  METHADONE/   Imported By: Garlan Fillers 04/02/2010 10:36:21  _____________________________________________________________________  External Attachment:    Type:   Image     Comment:   External Document

## 2010-07-20 NOTE — Progress Notes (Signed)
Summary: Methodone  Phone Note From Other Clinic   Summary of Call: Form from [facility].  Question about Methadone.  Is this to be every 8 hours or is it as needed every 8 hours. Sander Nephew RN  September 18, 2009 2:10 PM  Initial call taken by: Sander Nephew RN,  September 18, 2009 2:10 PM  Follow-up for Phone Call        This can be as needed for pain. Follow-up by: Milta Deiters MD,  September 22, 2009 9:54 AM  Additional Follow-up for Phone Call Additional follow up Details #1::        Faxed to [facility]. Additional Follow-up by: Sander Nephew RN,  September 22, 2009 10:45 AM

## 2010-07-20 NOTE — Medication Information (Signed)
Summary: [name]   Imported By: Garlan Fillers 04/21/2010 14:21:13  _____________________________________________________________________  External Attachment:    Type:   Image     Comment:   External Document

## 2010-07-20 NOTE — Progress Notes (Signed)
Summary: Refill/gh  Phone Note Refill Request Message from:  Fax from Pharmacy on October 06, 2009 9:32 AM  Refills Requested: Medication #1:  Artificial tears 1.4% Drops  Method Requested: Electronic Initial call taken by: Sander Nephew RN,  October 06, 2009 9:33 AM  Follow-up for Phone Call        I do not know how to order artificial tears on the EMR.  Please call the relevant pharmacy and order thousands of gallons of whatever artificial tears they suggest, and refill until [place]. Follow-up by: Milta Deiters MD,  October 07, 2009 11:25 AM  Additional Follow-up for Phone Call Additional follow up Details #1::        Refill called to [facility] per order of Dr. Stann Mainland. Additional Follow-up by: Sander Nephew RN,  October 08, 2009 8:57 AM

## 2010-07-20 NOTE — Progress Notes (Signed)
Summary: refill/ hla  Phone Note Refill Request Message from:  Fax from Pharmacy on December 25, 2009 3:25 PM  Refills Requested: Medication #1:  ZOCOR 20 MG TABS Take 1 tablet by mouth once a day   Last Refilled: 6/9 Initial call taken by: Freddy Finner RN,  December 25, 2009 3:26 PM    Prescriptions: ZOCOR 20 MG TABS (SIMVASTATIN) Take 1 tablet by mouth once a day  #31 Tablet x 11   Entered and Authorized by:   Milta Deiters MD   Signed by:   Milta Deiters MD on 12/25/2009   Method used:   Electronically to        [facility] (retail)       [address], [place]  [address]       Ph: [phone]       Fax: [phone]   RxID:   [id number]

## 2010-07-20 NOTE — Assessment & Plan Note (Signed)
Summary: Social Work  This is an elderly gentleman with multiple health issues and limited to no social supports. He lives locally in an apt. here in [place].  His 75 year old sister lives not too far from him.  There is a nephew in [place].  He has children that live out of state but it seems he has no contact with them.   The pt. tells me that he still drives locally to his doctor's appointments and also for groceries.   His SS check is around $950.  He does get help with his Medicare B and D.  He tells me he is on a Medicare Advantage plan.   The pt. tells me that he is being evicted from his apt. Oct 24th because the landlord wants to spruce up the place.  He says he loves living where he is because he can walk in the park and see the birds and people walking their dogs. The pt also tells me that he is distressed because his pharmacist at [facility] has died.  The obituary was in the paper this morning.   Today I basically listened to his problems and established relationship.   He has asked that I write a letter of support on his behalf.  Apparently he has lived in this apt. for 18 1/2 years.   The landlord is [employer] and the pt. plans to call me with address so I can write this letter.   We did not get a chance to talk about various social and lunch programs that might be available to him at the local churches and senior centers.   We will see if he would like to participate in some of these activities.  Bonna Gains in financial counseling did share with me that the patient has to go to court on Friday because of an altercation with a neighbor.  So he has much on his plate.   The plan is to a write letter on behalf of the patient.  The patient has my card and I've instructed him to keep [place] informed about the eviction and we can try and locate another place for him. But he would really prefer to stay where he is.      SW follow-up.

## 2010-07-20 NOTE — Assessment & Plan Note (Signed)
Summary: EST-1 MONTH F/U VISIT/CH   Vital Signs:  Patient profile:   77 year old male Height:      65 inches (165.10 cm) Weight:      177.0 pounds (80.45 kg) BMI:     29.56 Temp:     97.0 degrees F (36.11 degrees C) oral Pulse rate:   78 / minute BP sitting:   165 / 96  (right arm) Cuff size:   regular  Vitals Entered By: Mateo Flow Deborra Medina) (November 24, 2009 3:05 PM) CC: 44mth f/u, , Depression Is Patient Diabetic? No Nutritional Status BMI of 25 - 29 = overweight  Have you ever been in a relationship where you felt threatened, hurt or afraid?No   Does patient need assistance? Functional Status Cook/clean, Shopping, Social activities Ambulation Impaired:Risk for fall Comments pt uses a cane and lives at [facility] (assisted living)   Primary Care Provider:  Milta Deiters MD  CC:  61mth f/u, , and Depression.  History of Present Illness: 4 man with depression.  His major problem for several seasons now has been disaffection with his assisted living arrangement.  He has been in [facility] with a roommate he does not like.  He has made enemies among the staff and has refused to sign a paper he does not understand.  Meanwhile, there is a possibility that his nephew will allow him to move back to a guesthouse behind his sister's home on [address].  Mr. Dilmore had lived there for 15 years (over a decade ago).  This is at groundlevel and has good facilities.  I hope this will work out.  Depression History:      The patient is having a depressed mood most of the day and has a diminished interest in his usual daily activities.        Suicide risk questions reveal that he does not feel like life is worth living.  The patient denies that he wishes that he were dead and denies that he has thought about ending his life.         Preventive Screening-Counseling & Management  Alcohol-Tobacco     Alcohol drinks/day: 0     Smoking Status: quit     Year Quit: 1980     Passive  Smoke Exposure: no  Allergies: No Known Drug Allergies   Impression & Recommendations:  Problem # 1:  HYPERTENSION (ICD-401.9) Up some today but he is full of concerns.  He is unable to vouch for the meds they give him at the assisted living.  If he fresumes independent living, he will come in with med bottles and we can work on this.   His updated medication list for this problem includes:    Benazepril Hcl 40 Mg Tabs (Benazepril hcl) .Marland Kitchen... Take 1 tablet by mouth once a day    Amlodipine Besylate 5 Mg Tabs (Amlodipine besylate) .Marland Kitchen... Take 1 tablet by mouth once a day    Furosemide 20 Mg Tabs (Furosemide) .Marland Kitchen... Take 1 tablet by mouth once a day  Complete Medication List: 1)  Aspirin 81 Mg Tbec (Aspirin) .... Once daily 2)  Zocor 20 Mg Tabs (Simvastatin) .... Take 1 tablet by mouth once a day 3)  Methadone Hcl 5 Mg Tabs (Methadone hcl) .... Take one tablet every 8 hours 4)  Prilosec Otc 20 Mg Tbec (Omeprazole magnesium) .... Take 1 tablet by mouth once a day 5)  Miralax Powd (Polyethylene glycol 3350) .... Use as directed 6)  Wellbutrin  100 Mg Tabs (Bupropion hcl) .... Take 1 tablet three times a day 7)  Benazepril Hcl 40 Mg Tabs (Benazepril hcl) .... Take 1 tablet by mouth once a day 8)  Flomax 0.4 Mg Xr24h-cap (Tamsulosin hcl) .... Take 1 tablet by mouth once a day 9)  Amlodipine Besylate 5 Mg Tabs (Amlodipine besylate) .... Take 1 tablet by mouth once a day 10)  Furosemide 20 Mg Tabs (Furosemide) .... Take 1 tablet by mouth once a day 11)  Senokot S 8.6-50 Mg Tabs (Sennosides-docusate sodium) .... Take 4 tabs hs every night 12)  Tab-a-vite Tabs (Multiple vitamin) .Marland Kitchen.. 1 tablet by mouth daily  Patient Instructions: 1)  Please schedule a follow-up appointment in 3 months.

## 2010-07-20 NOTE — Miscellaneous (Signed)
Summary: [name]   Imported By: Enedina Finner 03/15/2010 16:20:42  _____________________________________________________________________  External Attachment:    Type:   Image     Comment:   External Document

## 2010-07-20 NOTE — Assessment & Plan Note (Signed)
Summary: ED Visit  Patient comes to the ED with CC of left hip pain, right knee pain, neck pain and weakness. Patient underwent imaging of his left hip which was essentially normal. His labs looked normal. ED staff called me to evaluate and set up a OPC follow up.   Patient seems to be slightly depressed recently, but no other complaints other than his chronic pains. Patient lives in a ALF and is agreeable to go to his facility. Advised him to follow up with OPC early next week. Also recommended to call Dr. Idelle Crouch office tomorrow to make an appointment to be seen for his left hip pain. Will continue current management.   Appended Document: ED Visit Appointment scheduled for Tue 3/15 for ED f/u.   scheduled through [facility] [place] @ [id number] at [facility]

## 2010-07-20 NOTE — Miscellaneous (Signed)
Summary: Advertising account planner Living: Care Plan  [facility]: Care Plan   Imported By: Bonner Puna 07/07/2009 14:09:03  _____________________________________________________________________  External Attachment:    Type:   Image     Comment:   External Document

## 2010-07-20 NOTE — Progress Notes (Signed)
Summary: REfill/gh  Phone Note Refill Request Message from:  Fax from Pharmacy on December 15, 2009 9:50 AM  Refills Requested: Medication #1:  Librium 25 mg capsules 1 cap twicw daily # 60  Method Requested: Electronic Initial call taken by: Sander Nephew RN,  December 15, 2009 9:57 AM  Follow-up for Phone Call        Refill approved-nurse to complete Follow-up by: Milta Deiters MD,  December 16, 2009 8:10 AM  Additional Follow-up for Phone Call Additional follow up Details #1::        Rx called to pharmacy Additional Follow-up by: Sander Nephew RN,  December 17, 2009 12:24 PM    New/Updated Medications: CHLORDIAZEPOXIDE HCL 25 MG CAPS (CHLORDIAZEPOXIDE HCL) Take 1 capsule by mouth two times a day Prescriptions: CHLORDIAZEPOXIDE HCL 25 MG CAPS (CHLORDIAZEPOXIDE HCL) Take 1 capsule by mouth two times a day  #60 x 6   Entered and Authorized by:   Milta Deiters MD   Signed by:   Milta Deiters MD on 12/16/2009   Method used:   Telephoned to ...       [facility] (retail)       [address], [place]  [address]       Ph: [phone]       Fax: [phone]   RxID:   [id number]

## 2010-07-20 NOTE — Progress Notes (Signed)
Summary: Refill/gh  Phone Note Refill Request Message from:  Fax from Pharmacy on September 22, 2009 3:11 PM  Refills Requested: Medication #1:  Tab-a-vite 1 tablet daily # [name] [phone]   Method Requested: Fax to [facility] Initial call taken by: Sander Nephew RN,  September 22, 2009 3:11 PM  Follow-up for Phone Call        "Tab a vit" cannot be found in the EMR.  Please call the requested pharmacy, get the EXACT name, enter it in [name] med list, and refill it until [facility]. Follow-up by: Milta Deiters MD,  September 23, 2009 9:12 AM    New/Updated Medications: TAB-A-VITE  TABS (MULTIPLE VITAMIN) 1 tablet by mouth daily Prescriptions: TAB-A-VITE  TABS (MULTIPLE VITAMIN) 1 tablet by mouth daily  #30 x 11   Entered and Authorized by:   Milta Deiters MD   Signed by:   Milta Deiters MD on 09/23/2009   Method used:   Telephoned to ...       [facility] (retail)       [address], [place]   [address]       Ph: [phone]       Fax: [phone]   [id number]  Prescription has been called in and will be forwarded to Dr. Stann Mainland for signature.Sander Nephew RN  September 23, 2009 12:27 PM  Appended Document: Refill/gh Vitamin C 500 mg Rx was denied by Dr Stann Mainland and pharmacy informed

## 2010-07-20 NOTE — Progress Notes (Signed)
Summary: med refill/gp  Phone Note Refill Request Message from:  Fax from Pharmacy on June 29, 2009 5:09 PM  Refills Requested: Medication #1:  ASPIRIN 81 MG TBEC once daily   Last Refilled: 06/01/2009  Medication #2:  ZOCOR 20 MG TABS Take 1 tablet by mouth once a day   Last Refilled: 05/29/2009  Medication #3:  FLOMAX 0.4 MG  XR24H-CAP Take 1 tablet by mouth once a day   Last Refilled: 05/29/2009  Method Requested: Fax to [facility] Initial call taken by: Morrison Old RN,  June 29, 2009 5:09 PM  Follow-up for Phone Call        Rx refillrequests faxed to [facility] Follow-up by: Morrison Old RN,  June 30, 2009 3:07 PM    Prescriptions: FLOMAX 0.4 MG  XR24H-CAP (TAMSULOSIN HCL) Take 1 tablet by mouth once a day  #30 x 5   Entered and Authorized by:   Milta Deiters MD   Signed by:   Milta Deiters MD on 06/30/2009   Method used:   Telephoned to ...       [facility] (retail)       [address], [place]  [address]       Ph: [phone]       Fax: [phone]   RxID:   [id number] ZOCOR 20 MG TABS (SIMVASTATIN) Take 1 tablet by mouth once a day  #31 Tablet x 5   Entered and Authorized by:   Milta Deiters MD   Signed by:   Milta Deiters MD on 06/30/2009   Method used:   Telephoned to ...       [facility] (retail)       [address], [place]  [address]       Ph: [phone]       Fax: [phone]   RxID:   [id number] MG TBEC (ASPIRIN) once daily  #100 x 11   Entered and Authorized by:   Milta Deiters MD   Signed by:   Milta Deiters MD on 06/30/2009   Method used:   Telephoned to ...       [facility] (retail)       [address], [place]  [address]       Ph: [phone]       Fax: [phone]   RxID:   [id number]

## 2010-07-20 NOTE — Letter (Signed)
Summary: [name]   Imported By: Garlan Fillers 09/02/2009 12:02:16  _____________________________________________________________________  External Attachment:    Type:   Image     Comment:   External Document

## 2010-07-20 NOTE — Progress Notes (Signed)
Summary: refill/gg  Phone Note Refill Request  on January 25, 2010 4:11 PM  Refills Requested: Medication #1:  METHADONE HCL 5 MG TABS Take one tablet every 8 hours   Last Refilled: 12/24/2009 Call from Judson Roch at [facility] asking for refill.  Pt is out of this med. # [id number]   Method Requested: Fax to [facility] Initial call taken by: Gevena Cotton RN,  January 25, 2010 4:12 PM  Follow-up for Phone Call        Rx written by Dr Lynnae January, then faxed and mailed to [facility].Mateo Flow [facility])  January 25, 2010 5:00 PM  Follow-up by: Mateo Flow [facility]),  January 25, 2010 5:00 PM

## 2010-07-20 NOTE — Assessment & Plan Note (Signed)
Summary: PAIN LT SIDE/ SB.   Vital Signs:  Patient profile:   77 year old male Height:      65 inches (165.10 cm) Weight:      176.4 pounds (80.18 kg) BMI:     29.46 Temp:     97.0 degrees F oral Pulse rate:   71 / minute BP sitting:   163 / 77  (right arm) Cuff size:   regular  Vitals Entered By: Morrison Old RN (May 21, 2010 10:58 AM)  Nutrition Counseling: Patient's BMI is greater than 25 and therefore counseled on weight management options. CC: Left-sided facial pain also headaches. Is Patient Diabetic? No Pain Assessment Patient in pain? no      Nutritional Status BMI of 25 - 29 = overweight  Have you ever been in a relationship where you felt threatened, hurt or afraid?No   Does patient need assistance? Functional Status Self care Ambulation Normal   Primary Care Provider:  Milta Deiters MD  CC:  Left-sided facial pain also headaches..  History of Present Illness: Patient c/o severe 6/10 in intensity left occipital HA for 3 weeks. [facility], patient developed some drooping of right corner of his mouth. Denies any visual changes, speech deficits or balance problems, although does states that "sometime" he is dizzy (but not a new finding for him). Denies any trauma/falls.  Depression History:      The patient denies a depressed mood most of the day and a diminished interest in his usual daily activities.         Preventive Screening-Counseling & Management  Alcohol-Tobacco     Alcohol drinks/day: 0     Smoking Status: quit     Year Quit: 1980     Passive Smoke Exposure: no  Caffeine-Diet-Exercise     Does Patient Exercise: no  Current Problems (verified): 1)  Hip Replacement, Left, Hx of  (ICD-V43.64) 2)  Hypertension  (ICD-401.9) 3)  Dyspnea On Exertion  (ICD-786.09) 4)  Dizziness  (ICD-780.4) 5)  Renal Insufficiency  (ICD-588.9) 6)  Edema  (ICD-782.3) 7)  Unspecified Anemia  (ICD-285.9) 8)  Depression  (ICD-311) 9)   Anxiety  (ICD-300.00) 10)  Pain, Chronic Nec  (ICD-338.29) 11)  Skin Cancer, Hx of  (ICD-V10.83) 12)  Benign Prostatic Hypertrophy, With Obstruction  (ICD-600.01) 13)  Alcoholism  (ICD-303.90) 14)  Diverticulosis, Colon  (ICD-562.10) 15)  Gerd  (ICD-530.81) 16)  Ibs  (ICD-564.1)  Medications Prior to Update: 1)  Aspirin 81 Mg Tbec (Aspirin) .... Once Daily 2)  Zocor 20 Mg Tabs (Simvastatin) .... Take 1 Tablet By Mouth Once A Day 3)  Methadone Hcl 5 Mg Tabs (Methadone Hcl) .... Take One Tablet Every 8 Hours 4)  Prilosec Otc 20 Mg Tbec (Omeprazole Magnesium) .... Take 1 Tablet By Mouth Once A Day 5)  Miralax   Powd (Polyethylene Glycol 3350) .... Use As Directed 6)  Wellbutrin 100 Mg Tabs (Bupropion Hcl) .... Take 1 Tablet Three Times A Day 7)  Benazepril Hcl 40 Mg  Tabs (Benazepril Hcl) .... Take 1 Tablet By Mouth Once A Day 8)  Flomax 0.4 Mg  Xr24h-Cap (Tamsulosin Hcl) .... Take 1 Tablet By Mouth Once A Day 9)  Amlodipine Besylate 5 Mg Tabs (Amlodipine Besylate) .... Take 1 Tablet By Mouth Once A Day 10)  Furosemide 20 Mg Tabs (Furosemide) .... Take 1 Tablet By Mouth Once A Day 11)  Senokot S 8.6-50 Mg Tabs (Sennosides-Docusate Sodium) .... Take 4 Tabs Hs Every  Night 12)  Tab-A-Vite  Tabs (Multiple Vitamin) .Marland Kitchen.. 1 Tablet By Mouth Daily 13)  Chlordiazepoxide Hcl 25 Mg Caps (Chlordiazepoxide Hcl) .... Take 1 Capsule By Mouth Two Times A Day  Allergies (verified): No Known Drug Allergies  Past History:  Past medical, surgical, family and social histories (including risk factors) reviewed for relevance to current acute and chronic problems.  Past Medical History: Reviewed history from 06/22/2006 and no changes required. Anxiety Depression Diverticulosis, colon GERD Hypertension Chronic pain Alcoholism Weight loss IBS BPH Skin cancer, hx of  Family History: Reviewed history and no changes required.  Social History: Reviewed history from 10/30/2006 and no changes  required. Mr Zachary [name] has been divorced for decades.  He has 2 adult children probably in [place] with no contact.  Had a sig other who died a few years ago. He lives in a garage apartment behind his sister's house in [place].  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  Maintains some contact with his sister and some nephews, but seems to have a lonely existence.  History has included smoking and periods of drinking (details of this are unclear to me). He has Medicare and Medicaid.  Physical Exam  General:  Well-developed,well-nourished,in no acute distress; alert,appropriate and cooperative throughout examination Head:  atraumatic.  Left parieto-occipital scalp area with severe seborrheic keratosis. No Sx of infection. Eyes:  pupils equal, pupils round, pupils reactive to light, pupils react to accomodation, corneas and lenses clear, no injection, and no nystagmus.   Ears:  R ear normal and L ear normal.   Nose:  no external deformity and no nasal discharge.   Mouth:  no erythema and teeth missing.  Uvula is midline. Neck:  no thyromegaly and no carotid bruits.   Lungs:  normal respiratory effort, no crackles, and no wheezes.   Heart:  normal rate, regular rhythm, and no murmur.   Abdomen:  soft, non-tender, and no masses.   Msk:  Has pain on ROM of several joints.  Left leg is slightly shorter. Uses a cane for ambulation. Extremities:  no edema. Neurologic:  No cranial nerve deficits noted. Station and gait are normal. Plantar reflexes are down-going bilaterally. DTRs are symmetrical throughout. Sensory, motor and coordinative functions appear intact. Skin:  turgor normal and color normal.   Psych:  Oriented X3, memory intact for recent and remote, normally interactive, good eye contact, and depressed affect.     Impression & Recommendations:  Problem # 1:  DIZZINESS (ICD-780.4)  Patient reports new  HA with a facial assymetry for 2-3 weeks. Concerning for intracranial bleed vs tumor. Discussed with Dr. Eppie Gibson. STAT CT of head  neg for bleed. Will place on ASA plus MRI to evaluate further. Orders: CT without Contrast (CT w/o contrast) MRI with Contrast (MRI w/Contrast)  Demonstrated maneuvers to self-treat vertigo. Patient to call to be seen if no improvement in 10-14 days, sooner if worse.   Problem # 2:  HYPERTENSION (ICD-401.9) Assessment: Unchanged Suboptimal BP reading this morning. Will continue to follow. Consider to increase dose of amlodipine. His updated medication list for this problem includes:    Benazepril Hcl 40 Mg Tabs (Benazepril hcl) .Marland Kitchen... Take 1 tablet by mouth once a day    Amlodipine Besylate 5 Mg Tabs (Amlodipine besylate) .Marland Kitchen... Take 1 tablet by mouth once a day    Furosemide 20 Mg Tabs (Furosemide) .Marland Kitchen... Take 1 tablet by mouth  once a day  Orders: T-Basic Metabolic Panel (99991111)  BP today: 163/77 Prior BP: 156/83 (03/19/2010)  Labs Reviewed: K+: 4.3 (03/19/2010) Creat: : 1.47 (03/19/2010)   Chol: 139 (12/03/2007)   HDL: 40 (12/03/2007)   LDL: 68 (12/03/2007)   TG: 153 (12/03/2007)  Problem # 3:  DEPRESSION (ICD-311)  No Si/HI or mania. Wellbutrin seems to be ineffective. Will change to Cymbalta that has also indication to treat chronic pain which the patient c/o.  His updated medication list for this problem includes:    Cymbalta 30 Mg Cpep (Duloxetine hcl) .Marland Kitchen... Take one tablet once a day by by mouth    Chlordiazepoxide Hcl 25 Mg Caps (Chlordiazepoxide hcl) .Marland Kitchen... Take 1 capsule by mouth two times a day  Discussed treatment options, including trial of antidpressant medication. Will refer to behavioral health. Follow-up call in in 24-48 hours and recheck in 2 weeks, sooner as needed. Patient agrees to call if any worsening of symptoms or thoughts of doing harm arise. Verified that the patient has no suicidal ideation at this time.   Complete Medication  List: 1)  Aspirin 325 Mg Tabs (Aspirin) .... Take one tablet daily with meals 2)  Zocor 20 Mg Tabs (Simvastatin) .... Take 1 tablet by mouth once a day 3)  Methadone Hcl 5 Mg Tabs (Methadone hcl) .... Take one tablet every 8 hours 4)  Prilosec Otc 20 Mg Tbec (Omeprazole magnesium) .... Take 1 tablet by mouth once a day 5)  Miralax Powd (Polyethylene glycol 3350) .... Use as directed 6)  Cymbalta 30 Mg Cpep (Duloxetine hcl) .... Take one tablet once a day by by mouth 7)  Benazepril Hcl 40 Mg Tabs (Benazepril hcl) .... Take 1 tablet by mouth once a day 8)  Flomax 0.4 Mg Xr24h-cap (Tamsulosin hcl) .... Take 1 tablet by mouth once a day 9)  Amlodipine Besylate 5 Mg Tabs (Amlodipine besylate) .... Take 1 tablet by mouth once a day 10)  Furosemide 20 Mg Tabs (Furosemide) .... Take 1 tablet by mouth once a day 11)  Senokot S 8.6-50 Mg Tabs (Sennosides-docusate sodium) .... Take 4 tabs hs every night 12)  Tab-a-vite Tabs (Multiple vitamin) .Marland Kitchen.. 1 tablet by mouth daily 13)  Chlordiazepoxide Hcl 25 Mg Caps (Chlordiazepoxide hcl) .... Take 1 capsule by mouth two times a day 14)  Ultram 50 Mg Tabs (Tramadol hcl) .... Take one tablet by mouth q 6 hours as needed for pain  Patient Instructions: 1)  Please, give thiss instruction sheet to your nurse at the assisted living residence. 2)  Please, NOTE  a change in your medications: Stop taking wellbutrin. Start taking Cymbalta for depressiona dn chronic pain. 3)  Start Taking Tramadol on as needed basis for HA. Prescriptions: CYMBALTA 30 MG CPEP (DULOXETINE HCL) Take one tablet once a day by by mouth  #30 x 11   Entered and Authorized by:   Milana Obey MD   Signed by:   Milana Obey MD on 05/21/2010   Method used:     [id number] ULTRAM 50 MG TABS (TRAMADOL HCL) Take one tablet by mouth q 6 hours as needed for pain  #120 x 6   Entered and Authorized by:   Milana Obey MD   Signed by:   Milana Obey MD on 05/21/2010   Method  used:     [id number] ASPIRIN 325 MG TABS (ASPIRIN) Take one tablet daily with meals  #30 x 11   Entered and Authorized by:  Milana Obey MD   Signed by:   Milana Obey MD on 05/21/2010   Method used:     [id number]    Orders Added: 1)  CT without Contrast [CT w/o contrast] 2)  Est. Patient Level IV [id number] 3)  T-Basic Metabolic Panel 0000000 4)  MRI with Contrast [MRI w/Contrast]    Prevention & Chronic Care Immunizations   Influenza vaccine: Fluvax MCR  (03/19/2010)    Tetanus booster: Not documented    Pneumococcal vaccine: Pneumovax (State)  (04/30/2007)    H. zoster vaccine: Not documented  Colorectal Screening   Hemoccult: Not documented   Hemoccult action/deferral: Not indicated  (09/11/2009)    Colonoscopy: Location:  [facility].    (11/13/2008)   Colonoscopy action/deferral: Not indicated  (01/16/2009)  Other Screening   PSA: Not documented   PSA action/deferral: Not indicated  (09/11/2009)   Smoking status: quit  (05/21/2010)  Lipids   Total Cholesterol: 139  (12/03/2007)   Lipid panel action/deferral: Deferred   LDL: 68  (12/03/2007)   LDL Direct: Not documented   HDL: 40  (12/03/2007)   Triglycerides: 153  (12/03/2007)   Lipid panel due: 09/12/2010  Hypertension   Last Blood Pressure: 163 / 77  (05/21/2010)   Serum creatinine: 1.47  (03/19/2010)   Serum potassium 4.3  (03/19/2010)  Self-Management Support :   Personal Goals (by the next clinic visit) :      Personal blood pressure goal: 140/90  (09/11/2009)   Patient will work on the following items until the next clinic visit to reach self-care goals:     Medications and monitoring: take my medicines every day, bring all of my medications to every visit  (05/21/2010)     Eating: use fresh or frozen vegetables, eat foods that are low in salt, eat baked foods instead of fried foods  (05/21/2010)     Activity: take a 30 minute walk every day   (05/21/2010)     Other: PATIENT NOT EATING WELL  (09/01/2009)    Hypertension self-management support: Written self-care plan  (05/21/2010)   Hypertension self-care plan printed.  Process Orders Check Orders Results:     [facility]: Check successful Tests Sent for requisitioning (May 21, 2010 1:21 PM):     05/21/2010: [facility] -- T-Basic Metabolic Panel 0000000 (signed)     Appended Document: PAIN LT SIDE/ SB. Patient is likely to suffer from a Bells Palsy. Will follow up in 1 -2 weeks.

## 2010-07-20 NOTE — Progress Notes (Signed)
  Phone Note Call from Patient   Reason for Call: Refill Medication, Talk to Doctor Summary of Call: Patient receives methadone 5 mg taken three times daily as needed for pain. His last prescription is from 9/08 and he is now due for a refill. The prescription needs to be faxed to [phone]. Will fax a prescription for vicodin, as I am unsure if I am allowed to write for methadone. I explained this to the nursing home staff and they said that was ok, and advised the facility where patient resides at, that he needs a follow up appointment and to call on Monday to pick up the methadone prescription.  Patient has chronic pain, and history of skin cancer and has been on chronic methadone therapy.    New/Updated Medications: VICODIN ES 7.5-750 MG TABS (HYDROCODONE-ACETAMINOPHEN) Take 1 tablet by mouth three times a day. Prescriptions: VICODIN ES 7.5-750 MG TABS (HYDROCODONE-ACETAMINOPHEN) Take 1 tablet by mouth three times a day.  #20 x 0   Entered and Authorized by:   Jolene Provost MD   Signed by:   Jolene Provost MD on 03/27/2010   Method used:   Printed then faxed to ...         [id number]

## 2010-07-20 NOTE — Medication Information (Signed)
Summary: Zachary [name]   Imported By: Garlan Fillers 05/07/2010 14:20:28  _____________________________________________________________________  External Attachment:    Type:   Image     Comment:   External Document

## 2010-07-20 NOTE — Progress Notes (Signed)
Summary: Refill/gh  Phone Note Refill Request Message from:  Fax from Pharmacy on June 23, 2009 11:10 AM  Refills Requested: Medication #1:  AMLODIPINE BESYLATE 5 MG TABS Take 1 tablet by mouth once a day  Method Requested: Electronic Initial call taken by: Sander Nephew RN,  June 23, 2009 11:13 AM  Follow-up for Phone Call        Rx faxed to pharmacy Follow-up by: Sander Nephew RN,  June 24, 2009 4:32 PM    Prescriptions: AMLODIPINE BESYLATE 5 MG TABS (AMLODIPINE BESYLATE) Take 1 tablet by mouth once a day  #30 x 11   Entered and Authorized by:   Milta Deiters MD   Signed by:   Milta Deiters MD on 06/23/2009   Method used:   Telephoned to ...       [facility] (retail)       [address], [place]  [address]       Ph: [phone]       Fax: [phone]   RxID:   [id number]

## 2010-07-20 NOTE — Progress Notes (Signed)
Summary: refill/gg  Phone Note Refill Request  on December 18, 2009 3:26 PM  Refills Requested: Medication #1:  METHADONE HCL 5 MG TABS Take one tablet every 8 hours   Last Refilled: 11/18/2009 Senior Living is requesting "Hard Copy" of med.   Method Requested: Pick up at Office Initial call taken by: Gevena Cotton RN,  December 18, 2009 3:27 PM  Follow-up for Phone Call        Refill approved-nurse to complete Follow-up by: Milta Deiters MD,  December 24, 2009 9:18 AM    Prescriptions: METHADONE HCL 5 MG TABS (METHADONE HCL) Take one tablet every 8 hours  #90 x 0   Entered and Authorized by:   Milta Deiters MD   Signed by:   Milta Deiters MD on 12/24/2009   Method used:   Printed then mailed to ...       [facility] (retail)       [address]       [place], [place]  [address]       Ph: [phone] or [phone]       Fax: [phone]   RxID:   [id number]   Appended Document: refill/gg Received call from [facility] with [facility].  She asked that rx be faxed and mailed to their facility so pt could go ahead and get his med.  Rx faxed to her attention at [phone] and hard copy mailed to [place] [address].

## 2010-07-20 NOTE — Progress Notes (Signed)
Summary: refill/ hla  Phone Note Refill Request Message from:  Fax from Pharmacy on August 26, 2009 10:06 AM  Refills Requested: Medication #1:  METHADONE HCL 5 MG TABS Take one tablet every 8 hours as needed pain   Last Refilled: 2/4 Initial call taken by: Freddy Finner RN,  August 26, 2009 10:07 AM  Follow-up for Phone Call        Refill approved-nurse to complete    Prescriptions: METHADONE HCL 5 MG TABS (METHADONE HCL) Take one tablet every 8 hours as needed pain  #90 x 0   Entered and Authorized by:   Milta Deiters MD   Signed by:   Milta Deiters MD on 08/27/2009   Method used:   Print then Mail to Patient   RxID:   [id number]

## 2010-07-20 NOTE — Miscellaneous (Signed)
Summary: [facility]:  [facility]:   Imported By: Enedina Finner 01/08/2010 15:41:15  _____________________________________________________________________  External Attachment:    Type:   Image     Comment:   External Document

## 2010-07-20 NOTE — Progress Notes (Signed)
Summary: methadone rx//kg  Phone Note Call from Patient Call back at from a "hall phone" # unknown   Refills Requested: Medication #1:  METHADONE HCL 5 MG TABS Take one tablet every 8 hours   Dosage confirmed as above?Dosage Confirmed   Brand Name Necessary? No   Supply Requested: 1 month   Last Refilled: 02/25/2010 Reason for Call: Refill Medication Summary of Call: Received call today from pt starting that he is completely out of his methadone.  l will obtain a rx today from his PCP which is to be faxed and mailed to the facility at which he resides. Pt will keep f/u appt already scheduled for 03/19/10 will Dr Stann Mainland    Prescriptions: METHADONE HCL 5 MG TABS (METHADONE HCL) Take one tablet every 8 hours  #90 x 0   Entered and Authorized by:   Milta Deiters MD   Signed by:   Milta Deiters MD on 02/25/2010   Method used:   Printed then faxed to ...       [facility] (retail)       [address]       [place], [place]  [address]       Ph: [phone] or [phone]       Fax: [phone]   RxID:   [id number]

## 2010-07-20 NOTE — Progress Notes (Signed)
Summary: Protein Powder//kg  Phone Note Refill Request Message from:  Fax from Pharmacy on July 21, 2009 3:53 PM  Need new Rx for Protein Powder - Mix 1 scoop with applesauce 3 times daily. Qty 454   Method Requested: Fax to Local Pharmacy Initial call taken by: Morrison Old RN,  July 21, 2009 3:53 PM  Follow-up for Phone Call        What on earth is "protein powder"? Why not just grind up a desicated hamburger? Follow-up by: Milta Deiters MD,  July 22, 2009 8:03 AM  Additional Follow-up for Phone Call Additional follow up Details #1::        According to pt's nurse, he does not like the protein powder, can he just stop taking it?  The order was on his FL-2 when he arrived at their facility.  Asked if pcp would be willing to write an order to discontinue use.Nadine Counts Deborra Medina)  August 10, 2009 9:59 AM     Additional Follow-up for Phone Call Additional follow up Details #2::    There is no "protein powder" on his med list.  I have never heard of protein powder so I certainly did not prescribe it.  It sounds awful.  Change it to "Big Mac and fries as needed" Follow-up by: Milta Deiters MD,  August 10, 2009 3:54 PM  Additional Follow-up for Phone Call Additional follow up Details #3:: Details for Additional Follow-up Action Taken: Will d/c protein powder

## 2010-07-20 NOTE — Medication Information (Signed)
Summary: METHADONE  METHADONE   Imported By: Garlan Fillers 03/02/2010 11:40:02  _____________________________________________________________________  External Attachment:    Type:   Image     Comment:   External Document

## 2010-07-20 NOTE — Assessment & Plan Note (Signed)
Summary: 33MONTH F/U/EST/VS   Vital Signs:  Patient profile:   77 year old male Height:      65 inches (165.10 cm) Weight:      181.0 pounds (82.27 kg) BMI:     30.23 Temp:     98.0 degrees F (36.67 degrees C) oral Pulse rate:   72 / minute BP sitting:   150 / 80  (right arm) Cuff size:   large  Vitals Entered By: Mateo Flow Deborra Medina) (October 16, 2009 9:38 AM) CC: 61mth f/u, pt c/o dizziness, worsening Depression, methadone refill Is Patient Diabetic? No Pain Assessment Patient in pain? yes     Location: back,legs, hand Intensity: unable to rate Type: sharp Onset of pain  Chronic "always in pain" Nutritional Status BMI of > 30 = obese  Have you ever been in a relationship where you felt threatened, hurt or afraid?No   Does patient need assistance? Functional Status Self care Ambulation Impaired:Risk for fall Comments lives in an assisted living faciity   Primary Care Provider:  Milta Deiters MD  CC:  25mth f/u, pt c/o dizziness, worsening Depression, and methadone refill.  History of Present Illness: 47 man from assisted living.  Major problem is depression, partly situational, some chronic and some due to poor adaptation to living situation.  He does not get along with his roommate and has been too depressed to participate in activities at [facility].  He continues to lose weight.  C/o generalized pain but this is vague and inconsistent and probably due to depression.  Depression History:      The patient is having a depressed mood most of the day.        Suicide risk questions reveal that he does not feel like life is worth living and he wishes that he were dead.  The patient denies that he has thought about ending his life.        Preventive Screening-Counseling & Management  Alcohol-Tobacco     Alcohol drinks/day: 0     Smoking Status: quit     Year Quit: 1980     Passive Smoke Exposure: no  Allergies: No Known Drug Allergies  Physical Exam  Eyes:   anicteric.  nl injection. Neck:  supple, full ROM, no thyromegaly, and no carotid bruits.   Lungs:  normal respiratory effort, no crackles, and no wheezes.   Heart:  regular rhythm and no murmur.   Abdomen:  soft, non-tender, no hepatomegaly, and no splenomegaly.   Msk:  Has pain on ROM of several joints.  Left leg is slightly shorter. Extremities:  no edema Neurologic:  alert & oriented X3 and cranial nerves II-XII intact. some splinting from pain. strength normal in all extremities.  Poor romberg but nl finger to nose.   Impression & Recommendations:  Problem # 1:  DEPRESSION ([id number]) This is major issue.  Will increase welbutrin to 100 three times a day. There is little I can do about his situation and his huge backlog of loss.  Will concentrate on clarifying his meds and addressing some discomforts.   His updated medication list for this problem includes:    Wellbutrin 100 Mg Tabs (Bupropion hcl) .Marland Kitchen... Take 1 tablet three times a day  Problem # 2:  HYPERTENSION (ICD-401.9) close to controlled.  No chest pain or orthopnea.  His updated medication list for this problem includes:    Benazepril Hcl 40 Mg Tabs (Benazepril hcl) .Marland Kitchen... Take 1 tablet by mouth once a day  Amlodipine Besylate 5 Mg Tabs (Amlodipine besylate) .Marland Kitchen... Take 1 tablet by mouth once a day    Furosemide 20 Mg Tabs (Furosemide) .Marland Kitchen... Take 1 tablet by mouth once a day  Problem # 3:  RENAL INSUFFICIENCY (ICD-588.9) Creatinine 1.3; unchanged in 3 years.  [place]ytes nl.  Problem # 4:  PAIN, CHRONIC NEC (ICD-338.29) He is supposed to be on methadone 5mg  tid. Will clarify this with nursing home.  Problem # 5:  DIZZINESS (ICD-780.4) This is an active complaint today.  Not vertigo.  CNS exam is confounded by weakness and arthritis.  No clear focal findings.  I am concerned that increasing his pain and depression meds may worsen his disequilibrium.  Will see him again in 1 month to monitor this.  Complete Medication  List: 1)  Aspirin 81 Mg Tbec (Aspirin) .... Once daily 2)  Zocor 20 Mg Tabs (Simvastatin) .... Take 1 tablet by mouth once a day 3)  Methadone Hcl 5 Mg Tabs (Methadone hcl) .... Take one tablet every 8 hours 4)  Prilosec Otc 20 Mg Tbec (Omeprazole magnesium) .... Take 1 tablet by mouth once a day 5)  Miralax Powd (Polyethylene glycol 3350) .... Use as directed 6)  Wellbutrin 100 Mg Tabs (Bupropion hcl) .... Take 1 tablet three times a day 7)  Benazepril Hcl 40 Mg Tabs (Benazepril hcl) .... Take 1 tablet by mouth once a day 8)  Flomax 0.4 Mg Xr24h-cap (Tamsulosin hcl) .... Take 1 tablet by mouth once a day 9)  Amlodipine Besylate 5 Mg Tabs (Amlodipine besylate) .... Take 1 tablet by mouth once a day 10)  Furosemide 20 Mg Tabs (Furosemide) .... Take 1 tablet by mouth once a day 11)  Senokot S 8.6-50 Mg Tabs (Sennosides-docusate sodium) .... Take 4 tabs hs every night 12)  Tab-a-vite Tabs (Multiple vitamin) .Marland Kitchen.. 1 tablet by mouth daily   Patient Instructions: 1)  Please schedule a follow-up appointment in 1 month. Prescriptions: SENOKOT S 8.6-50 MG TABS (SENNOSIDES-DOCUSATE SODIUM) Take 4 tabs HS every night  #120 x 11   Entered and Authorized by:   Milta Deiters MD   Signed by:   Milta Deiters MD on 10/16/2009   Method used:   Print then Give to Patient   RxID:   [id number] WELLBUTRIN 100 MG TABS (BUPROPION HCL) Take 1 tablet three times a day  #90 x 11   Entered and Authorized by:   Milta Deiters MD   Signed by:   Milta Deiters MD on 10/16/2009   Method used:   Print then Give to Patient   RxID:   [id number] METHADONE HCL 5 MG TABS (METHADONE HCL) Take one tablet every 8 hours  #90 x 0   Entered and Authorized by:   Milta Deiters MD   Signed by:   Milta Deiters MD on 10/16/2009   Method used:   Print then Give to Patient   RxID:   [id number]    Prevention & Chronic Care Immunizations   Influenza vaccine: Fluvax MCR  (03/18/2008)    Tetanus booster: Not  documented    Pneumococcal vaccine: Pneumovax (State)  (04/30/2007)    H. zoster vaccine: Not documented  Colorectal Screening   Hemoccult: Not documented   Hemoccult action/deferral: Not indicated  (09/11/2009)    Colonoscopy: Location:  [facility].    (11/13/2008)   Colonoscopy action/deferral: Not indicated  (01/16/2009)  Other Screening   PSA: Not documented   PSA action/deferral: Not indicated  (09/11/2009)   Smoking status:  quit  (10/16/2009)  Lipids   Total Cholesterol: 139  (12/03/2007)   Lipid panel action/deferral: Deferred   LDL: 68  (12/03/2007)   LDL Direct: Not documented   HDL: 40  (12/03/2007)   Triglycerides: 153  (12/03/2007)   Lipid panel due: 09/12/2010  Hypertension   Last Blood Pressure: 150 / 80  (10/16/2009)   Serum creatinine: 1.33  (09/11/2009)   Serum potassium 4.5  (09/11/2009)  Self-Management Support :   Personal Goals (by the next clinic visit) :      Personal blood pressure goal: 140/90  (09/11/2009)   Patient will work on the following items until the next clinic visit to reach self-care goals:     Medications and monitoring: take my medicines every day  (10/16/2009)     Other: PATIENT NOT EATING WELL  (09/01/2009)    Hypertension self-management support: Resources for patients handout, Written self-care plan  (10/16/2009)   Hypertension self-care plan printed.      Resource handout printed. Referral Appointment Information Day/Date: May Time: Place/MD:Dr Denna Haggard Address: [address]  Phone/Fax: Patient given appointment information in hand

## 2010-07-20 NOTE — Letter (Signed)
Summary: [name]   Imported By: Garlan Fillers 10/28/2009 14:33:41  _____________________________________________________________________  External Attachment:    Type:   Image     Comment:   External Document

## 2010-07-20 NOTE — Miscellaneous (Signed)
Summary: Orders Update  Clinical Lists Changes  Orders: Added new Referral order of Social Work Referral (Social ) - Signed 

## 2010-07-20 NOTE — Progress Notes (Signed)
Summary: med refill/gp  Phone Note Refill Request Message from:  Fax from Pharmacy on June 25, 2009 4:00 PM  Refills Requested: Medication #1:  FUROSEMIDE 40 MG/4ML SOLN take 1 tablet by mouth daily.   Last Refilled: 06/01/2009 The pharmacy has pt. taking Lasix 20mg  tablet - take 1 tab by mouth every day   Method Requested: Fax to [facility] Initial call taken by: Morrison Old RN,  June 25, 2009 4:00 PM  Follow-up for Phone Call        Rx faxed to [facility]. Follow-up by: Morrison Old RN,  June 26, 2009 5:23 PM    New/Updated Medications: FUROSEMIDE 20 MG TABS (FUROSEMIDE) Take 1 tablet by mouth once a day Prescriptions: FUROSEMIDE 20 MG TABS (FUROSEMIDE) Take 1 tablet by mouth once a day  #30 x 11   Entered and Authorized by:   Milta Deiters MD   Signed by:   Milta Deiters MD on 06/26/2009   Method used:   Telephoned to ...       [facility] (retail)       [address], [place]  [address]       Ph: [phone]       Fax: [phone]   RxID:   [id number]

## 2010-07-20 NOTE — Progress Notes (Signed)
Summary: Soc. Work  Barista of Call: Spoke with Magda Paganini the bus. mgr at [employer].   She said that patient is behind on his payments to [employer].  He pays his own bills and manages his own account.   He finally understood that he would have to pay for his residence at [facility] after a consultant came in to speak with him.  Because he is behind he apparently does not take the $66 per month that he would normally get back.  Magda Paganini said that there are many activities at [facility] but pt.  tends to keep to himself though he does have some friends.  They do not have social work or case mgmt at assisted living unfortunately but there is an array of activities and transportation available to him.

## 2010-07-20 NOTE — Miscellaneous (Signed)
Summary: [name]: Diet Terminology/ Definitions  [name]: Diet Terminology/ Definitions   Imported By: [name] 01/08/2010 15:46:08  _____________________________________________________________________  External Attachment:    Type:   Image     Comment:   External Document

## 2010-07-20 NOTE — Assessment & Plan Note (Signed)
Summary: hip pain/gg   Vital Signs:  Patient profile:   77 year old male Height:      65 inches (165.10 cm) Weight:      185 pounds (84.09 kg) BMI:     30.90 Temp:     97.7 degrees F (36.50 degrees C) oral Pulse rate:   65 / minute BP sitting:   139 / 72  (right arm)  Vitals Entered By: Mateo Flow Deborra Medina) (September 11, 2009 11:08 AM) CC: Depression Is Patient Diabetic? No Nutritional Status BMI of > 30 = obese  Have you ever been in a relationship where you felt threatened, hurt or afraid?No   Does patient need assistance? Functional Status Cook/clean, Shopping, Social activities Ambulation Impaired:Risk for fall Comments uses a cane   Primary Care Provider:  Milta Deiters MD  CC:  Depression.  History of Present Illness: 6 man with depression.  He has had a series of life losses and landed in an assisted living residence last year.  He has some problems with his roommate and has not tried out any of the available activities.  He had been losing weight over several seasons but has regained several lbs this month so far.  He is receptive to advice to try out activities and re-socialize.  Depression History:      The patient is having a depressed mood most of the day and has a diminished interest in his usual daily activities.        Preventive Screening-Counseling & Management  Alcohol-Tobacco     Alcohol drinks/day: 0     Smoking Status: quit     Year Quit: 1980     Passive Smoke Exposure: no  Allergies: No Known Drug Allergies   Impression & Recommendations:  Problem # 1:  DEPRESSION (C6721020)  This is his major issue.  On welbutrin at starting dose; will increase.  Will also add sennokot for constipation. Encouraged social activities.   His updated medication list for this problem includes:    Wellbutrin 100 Mg Tabs (Bupropion hcl) .Marland Kitchen... Take 1 tablet two times a day  Orders: T-TSH [id number])  Complete Medication List: 1)  Aspirin 81 Mg Tbec  (Aspirin) .... Once daily 2)  Zocor 20 Mg Tabs (Simvastatin) .... Take 1 tablet by mouth once a day 3)  Methadone Hcl 5 Mg Tabs (Methadone hcl) .... Take one tablet every 8 hours as needed pain 4)  Prilosec Otc 20 Mg Tbec (Omeprazole magnesium) .... Take 1 tablet by mouth once a day 5)  Miralax Powd (Polyethylene glycol 3350) .... Use as directed 6)  Wellbutrin 100 Mg Tabs (Bupropion hcl) .... Take 1 tablet two times a day 7)  Benazepril Hcl 40 Mg Tabs (Benazepril hcl) .... Take 1 tablet by mouth once a day 8)  Flomax 0.4 Mg Xr24h-cap (Tamsulosin hcl) .... Take 1 tablet by mouth once a day 9)  Amlodipine Besylate 5 Mg Tabs (Amlodipine besylate) .... Take 1 tablet by mouth once a day 10)  Hydrocodone-acetaminophen 5-500 Mg Tabs (Hydrocodone-acetaminophen) .... Take 1 every 6 hours as needed pain 11)  Furosemide 20 Mg Tabs (Furosemide) .... Take 1 tablet by mouth once a day 12)  Senokot S 8.6-50 Mg Tabs (Sennosides-docusate sodium) .... Take 2 - 4 tabs hs as needed for constipation  Other Orders: T-Basic Metabolic Panel ([id number]) T-CBC No Diff [id number])   Patient Instructions: 1)  Please schedule a follow-up appointment in 1 month. Process Orders Check Orders Results:     [facility]: Check successful Order queued for requisitioning for [facility]: September 11, 2009 12:09 PM  Tests Sent for requisitioning (September 11, 2009 12:09 PM):     09/11/2009: [facility] -- T-Basic Metabolic Panel [id number] (signed)     09/11/2009: [facility] -- T-TSH [id number] (signed)     09/11/2009: [facility] -- T-CBC No Diff [id number] (signed)    Prevention & Chronic Care Immunizations   Influenza vaccine: Fluvax MCR  (03/18/2008)    Tetanus booster: Not documented    Pneumococcal vaccine: Pneumovax (State)  (04/30/2007)    H. zoster vaccine: Not documented  Colorectal Screening   Hemoccult: Not documented   Hemoccult  action/deferral: Not indicated  (09/11/2009)    Colonoscopy: Location:  [facility]    (11/13/2008)   Colonoscopy action/deferral: Not indicated  (01/16/2009)  Other Screening   PSA: Not documented   PSA action/deferral: Not indicated  (09/11/2009)   Smoking status: quit  (09/11/2009)  Lipids   Total Cholesterol: 139  (12/03/2007)   Lipid panel action/deferral: Deferred   LDL: 68  (12/03/2007)   LDL Direct: Not documented   HDL: 40  (12/03/2007)   Triglycerides: 153  (12/03/2007)   Lipid panel due: 09/12/2010  Hypertension   Last Blood Pressure: 139 / 72  (09/11/2009)   Serum creatinine: 1.64  (01/01/2009)   Serum potassium 4.7  (01/01/2009)    Hypertension flowsheet reviewed?: Yes   Progress toward BP goal: At goal  Self-Management Support :   Personal Goals (by the next clinic visit) :      Personal blood pressure goal: 140/90  (09/11/2009)   Hypertension self-management support: Not documented    Self-management comments: pt lives at an assisted living facility

## 2010-07-22 NOTE — Progress Notes (Signed)
Summary: refill/ hla  Phone Note Refill Request Message from:  Pharmacy on May 27, 2010 11:53 AM  Refills Requested: Medication #1:  METHADONE HCL 5 MG TABS Take one tablet every 8 hours   Dosage confirmed as above?Dosage Confirmed   Last Refilled: 12/10 pharm is filling last of scripts that was sent ahead of time on 12/10, could you please write 3 more, jan, feb and march and those will be sent to pharmacy. thanks, h.  Initial call taken by: Freddy Finner RN,  May 27, 2010 11:55 AM  Follow-up for Phone Call        Ask Ulis Rias to remind me in clinic on Friday Follow-up by: Milta Deiters MD,  June 02, 2010 2:47 PM  Additional Follow-up for Phone Call Additional follow up Details #1::        done Additional Follow-up by: Milta Deiters MD,  June 04, 2010 3:52 PM    Prescriptions: METHADONE HCL 5 MG TABS (METHADONE HCL) Take one tablet every 8 hours  #90 x 0   Entered and Authorized by:   Milta Deiters MD   Signed by:   Milta Deiters MD on 06/04/2010   Method used:   Print then Mail to Patient   RxID:   [id number]   Appended Document: refill/ hla Rxs were handwritten x 3 and given to Triage

## 2010-07-22 NOTE — Progress Notes (Signed)
Summary: f/u mri/ hla  Phone Note Other Incoming   Summary of Call: the director of facility called, pt concerned about mri results, reassured that the facility and pt would have been notified if there were concerns that needed addressing. agreeable w/ answer Initial call taken by: Freddy Finner RN,  June 02, 2010 3:19 PM

## 2010-07-22 NOTE — Progress Notes (Signed)
Summary: Refill/gh  Phone Note Refill Request Message from:  Fax from Pharmacy on June 30, 2010 2:07 PM  Refills Requested: Medication #1:  FUROSEMIDE 20 MG TABS Take 1 tablet by mouth once a day   Last Refilled: 06/02/2010  Method Requested: Electronic Initial call taken by: Sander Nephew RN,  June 30, 2010 2:07 PM    Prescriptions: FUROSEMIDE 20 MG TABS (FUROSEMIDE) Take 1 tablet by mouth once a day  #30 x 6   Entered and Authorized by:   Milta Deiters MD   Signed by:   Milta Deiters MD on 06/30/2010   Method used:   Electronically to        [facility] (retail)       [address], [place]  [address]       Ph: [phone]       Fax: [phone]   [place]:   [id number]

## 2010-07-22 NOTE — Miscellaneous (Signed)
  Clinical Lists Changes  Medications: Removed medication of CYMBALTA 30 MG CPEP (DULOXETINE HCL) Take one tablet once a day by by mouth Added new medication of PAXIL 20 MG TABS (PAROXETINE HCL) Take 1 tablet by mouth once a day for 2 weeks, then Take 1 tablet by mouth two times a day - Signed Rx of PAXIL 20 MG TABS (PAROXETINE HCL) Take 1 tablet by mouth once a day for 2 weeks, then Take 1 tablet by mouth two times a day;  #60 x 11;  Signed;  Entered by: Milta Deiters MD;  Authorized by: Milta Deiters MD;  Method used: Telephoned to    Prescriptions: PAXIL 20 MG TABS (PAROXETINE HCL) Take 1 tablet by mouth once a day for 2 weeks, then Take 1 tablet by mouth two times a day  #60 x 11   Entered and Authorized by:   Milta Deiters MD   Signed by:   Milta Deiters MD on 06/25/2010   Method used:   Telephoned to ...         [id number]

## 2010-07-22 NOTE — Progress Notes (Signed)
Summary: Rx refill//kg**  Phone Note Refill Request Message from:  [facility] on June 17, 2010 9:23 AM  Refills Requested: Medication #1:  CHLORDIAZEPOXIDE HCL 25 MG CAPS Take 1 capsule by mouth two times a day   Dosage confirmed as above?Dosage Confirmed   Brand Name Necessary? No   Supply Requested: 1 month   Last Refilled: 05/14/2010  Medication #2:  ULTRAM 50 MG TABS Take one tablet by mouth q 6 hours as needed for pain.   Dosage confirmed as above?Dosage Confirmed   Brand Name Necessary? No   Supply Requested: 1 month   Last Refilled: 05/21/2010 Received faxed request from [facility]  [place]) for "hard scripts" for pt's Tramadol 50mg  and Chlordiazepoxide 25mg .  [place] at [phone], hard copy not needed.  Will send request to MD for approval and call med in as written.Mateo Flow Deborra Medina)  June 17, 2010 9:47 AM      Prescriptions: CHLORDIAZEPOXIDE HCL 25 MG CAPS (CHLORDIAZEPOXIDE HCL) Take 1 capsule by mouth two times a day  #60 x 6   Entered by:   Mateo Flow ([place])   Authorized by:   Milta Deiters MD   Signed by:   Milta Deiters MD on 06/17/2010   Method used:   Telephoned to ...       [facility]* (retail)       [address], [place]  [address]       Ph: [phone]       Fax: [phone]   RxID:   [id number] ULTRAM 50 MG TABS (TRAMADOL HCL) Take one tablet by mouth q 6 hours as needed for pain  #120 x 6   Entered by:   Mateo Flow ([place])   Authorized by:   Milta Deiters MD   Signed by:   Milta Deiters MD on 06/17/2010   Method used:   Telephoned to ...       [facility]* (retail)       [address], [place]  [address]       Ph: [phone]       Fax: [phone]   RxID:   [id number]  [place]  Ph# [phone] Fax# [phone]  [place] [phone]

## 2010-07-22 NOTE — Progress Notes (Signed)
Summary: Refill/gh  Phone Note Refill Request Message from:  Fax from Pharmacy on July 14, 2010 3:46 PM  Refills Requested: Medication #1:  AMLODIPINE BESYLATE 5 MG TABS Take 1 tablet by mouth once a day   Last Refilled: 06/03/2010  Method Requested: Electronic Initial call taken by: Sander Nephew RN,  July 14, 2010 3:46 PM    Prescriptions: AMLODIPINE BESYLATE 5 MG TABS (AMLODIPINE BESYLATE) Take 1 tablet by mouth once a day  #30 x 11   Entered and Authorized by:   Milta Deiters MD   Signed by:   Milta Deiters MD on 07/16/2010   Method used:   Electronically to        [facility] (retail)       [address], [place]  [address]       Ph: [phone]       Fax: [phone]   [place]:   [phone]

## 2010-07-22 NOTE — Miscellaneous (Signed)
Summary: [name]  [name]   Imported By: Enedina Finner 06/08/2010 14:47:31  _____________________________________________________________________  External Attachment:    Type:   Image     Comment:   External Document

## 2010-07-22 NOTE — Miscellaneous (Signed)
  Clinical Lists Changes  Medications: Rx of CHLORDIAZEPOXIDE HCL 25 MG CAPS (CHLORDIAZEPOXIDE HCL) Take 1 capsule by mouth two times a day;  #60 x 6;  Signed;  Entered by: Rhea Pink  DO;  Authorized by: Rhea Pink  DO;  Method used: Printed then mailed to    Prescriptions: CHLORDIAZEPOXIDE HCL 25 MG CAPS (CHLORDIAZEPOXIDE HCL) Take 1 capsule by mouth two times a day  #60 x 6   Entered and Authorized by:   Rhea Pink  DO   Signed by:   Rhea Pink  DO on 06/17/2010   Method used:   Printed then mailed to ...         [id number]

## 2010-07-22 NOTE — Miscellaneous (Signed)
Summary: [facility]  [name]   Imported By: Enedina Finner 07/02/2010 13:59:24  _____________________________________________________________________  External Attachment:    Type:   Image     Comment:   External Document

## 2010-07-27 ENCOUNTER — Other Ambulatory Visit: Payer: Self-pay | Admitting: Internal Medicine

## 2010-07-27 MED ORDER — METHADONE HCL 5 MG PO TABS: 5.0000 mg | ORAL_TABLET | Freq: Three times a day (TID) | ORAL | Status: DC

## 2010-08-05 ENCOUNTER — Encounter: Payer: Self-pay | Admitting: Internal Medicine

## 2010-08-05 ENCOUNTER — Ambulatory Visit (INDEPENDENT_AMBULATORY_CARE_PROVIDER_SITE_OTHER): Payer: Medicare Other | Admitting: Internal Medicine

## 2010-08-05 DIAGNOSIS — G8929 Other chronic pain: Secondary | ICD-10-CM

## 2010-08-05 DIAGNOSIS — I10 Essential (primary) hypertension: Secondary | ICD-10-CM

## 2010-08-05 DIAGNOSIS — Z85828 Personal history of other malignant neoplasm of skin: Secondary | ICD-10-CM

## 2010-08-05 DIAGNOSIS — D649 Anemia, unspecified: Secondary | ICD-10-CM

## 2010-08-05 DIAGNOSIS — F329 Major depressive disorder, single episode, unspecified: Secondary | ICD-10-CM

## 2010-08-05 DIAGNOSIS — F3289 Other specified depressive episodes: Secondary | ICD-10-CM

## 2010-08-05 DIAGNOSIS — N259 Disorder resulting from impaired renal tubular function, unspecified: Secondary | ICD-10-CM

## 2010-08-05 MED ORDER — PAROXETINE HCL 20 MG PO TABS: 20.0000 mg | ORAL_TABLET | Freq: Every day | ORAL | Status: DC

## 2010-08-05 NOTE — Assessment & Plan Note (Signed)
He has variable GI sx.  GERD and Diverticulosis should cover these issues.

## 2010-08-05 NOTE — Assessment & Plan Note (Signed)
1.  Will re-start paxil 20 qd.  May need higher dose after next visit.  2.  Will see how the impending change in his roommate situation impacts his mood.

## 2010-08-05 NOTE — Assessment & Plan Note (Signed)
last hgb normal

## 2010-08-05 NOTE — Assessment & Plan Note (Signed)
furosemide and amlodipine.  BP OK.  Lungs clear.  cor regular w/o murmur.  no edema.  He is hard to pin down about CV sx.  Seems not to have effort-related angina or clear orthopnea.

## 2010-08-05 NOTE — Progress Notes (Signed)
76 man with depression and disaffection regarding living conditions. He has been in an ass't living home for well over a year in a room with a roommate he dislikes.  He tells me today that this roommate is leaving in 1 week.  This might be good news altho patient is depressed and unwilling to engage possible positive implications.  He is obsessed with overheard conversations, and facility staff who may "have it in for me".  On the other hand, he is ambivalent about moving. Has his usual fretful depression.  Apparantly, his anti-depressive med has been suspended.  Several weeks back, Dr. Stanford Scotland changed him to cymbalta which was never started due to insurance.  Meanwhile his paxil was stopped and yhe is on nothing.  He believes that paxil had been helpful in the past.

## 2010-08-05 NOTE — Assessment & Plan Note (Signed)
last creatinine was lower than any in past 2 years (1.23).  lytes all nl.

## 2010-08-05 NOTE — Assessment & Plan Note (Signed)
Does have a large lesion near right ear.  Will refer to Dr. Denna Haggard who knows him well.

## 2010-08-05 NOTE — Assessment & Plan Note (Signed)
He has several chronic pains, some at sites of old injuries.  Has been on stable, low-dose methadone for 2 years or more (started by a pain clinic).  He says this helps.

## 2010-08-06 ENCOUNTER — Ambulatory Visit: Payer: Self-pay | Admitting: Internal Medicine

## 2010-08-06 LAB — BASIC METABOLIC PANEL
CO2: 28 mEq/L (ref 19–32)
Calcium: 9.7 mg/dL (ref 8.4–10.5)
Sodium: 137 mEq/L (ref 135–145)

## 2010-08-16 ENCOUNTER — Other Ambulatory Visit: Payer: Self-pay | Admitting: *Deleted

## 2010-08-17 MED ORDER — CHLORDIAZEPOXIDE HCL 25 MG PO CAPS: 25.0000 mg | ORAL_CAPSULE | Freq: Two times a day (BID) | ORAL | Status: DC

## 2010-08-26 ENCOUNTER — Other Ambulatory Visit: Payer: Self-pay | Admitting: *Deleted

## 2010-08-26 MED ORDER — METHADONE HCL 5 MG PO TABS: 5.0000 mg | ORAL_TABLET | Freq: Three times a day (TID) | ORAL | Status: DC

## 2010-08-27 ENCOUNTER — Other Ambulatory Visit: Payer: Self-pay | Admitting: Internal Medicine

## 2010-08-27 DIAGNOSIS — G8929 Other chronic pain: Secondary | ICD-10-CM

## 2010-08-27 MED ORDER — METHADONE HCL 5 MG PO TABS: 5.0000 mg | ORAL_TABLET | Freq: Three times a day (TID) | ORAL | Status: DC

## 2010-09-01 ENCOUNTER — Other Ambulatory Visit: Payer: Self-pay | Admitting: Dermatology

## 2010-09-09 ENCOUNTER — Ambulatory Visit: Payer: Medicare Other | Admitting: Internal Medicine

## 2010-09-13 LAB — CBC
HCT: 42.4 % (ref 39.0–52.0)
Hemoglobin: 14.4 g/dL (ref 13.0–17.0)
MCV: 96.3 fL (ref 78.0–100.0)
Platelets: 165 10*3/uL (ref 150–400)
RBC: 4.41 MIL/uL (ref 4.22–5.81)
WBC: 5.6 10*3/uL (ref 4.0–10.5)

## 2010-09-13 LAB — URINALYSIS, ROUTINE W REFLEX MICROSCOPIC
Protein, ur: NEGATIVE mg/dL
Specific Gravity, Urine: 1.012 (ref 1.005–1.030)
Urobilinogen, UA: 2 mg/dL — ABNORMAL HIGH (ref 0.0–1.0)

## 2010-09-13 LAB — DIFFERENTIAL
Basophils Absolute: 0 10*3/uL (ref 0.0–0.1)
Basophils Relative: 1 % (ref 0–1)
Lymphocytes Relative: 40 % (ref 12–46)
Neutro Abs: 2.6 10*3/uL (ref 1.7–7.7)
Neutrophils Relative %: 46 % (ref 43–77)

## 2010-09-13 LAB — COMPREHENSIVE METABOLIC PANEL
BUN: 14 mg/dL (ref 6–23)
CO2: 26 mEq/L (ref 19–32)
Chloride: 105 mEq/L (ref 96–112)
Creatinine, Ser: 1.58 mg/dL — ABNORMAL HIGH (ref 0.4–1.5)
GFR calc non Af Amer: 43 mL/min — ABNORMAL LOW (ref >60)
Glucose, Bld: 105 mg/dL — ABNORMAL HIGH (ref 70–99)
Total Bilirubin: 0.5 mg/dL (ref 0.3–1.2)

## 2010-09-13 LAB — URINE MICROSCOPIC-ADD ON

## 2010-09-25 LAB — BASIC METABOLIC PANEL
BUN: 19 mg/dL (ref 6–23)
BUN: 21 mg/dL (ref 6–23)
BUN: 27 mg/dL — ABNORMAL HIGH (ref 6–23)
CO2: 24 mEq/L (ref 19–32)
CO2: 26 mEq/L (ref 19–32)
Calcium: 8.4 mg/dL (ref 8.4–10.5)
Calcium: 8.4 mg/dL (ref 8.4–10.5)
Calcium: 8.5 mg/dL (ref 8.4–10.5)
Chloride: 106 mEq/L (ref 96–112)
Creatinine, Ser: 1.09 mg/dL (ref 0.4–1.5)
Creatinine, Ser: 1.32 mg/dL (ref 0.4–1.5)
Creatinine, Ser: 1.44 mg/dL (ref 0.4–1.5)
Creatinine, Ser: 1.59 mg/dL — ABNORMAL HIGH (ref 0.4–1.5)
GFR calc Af Amer: 58 mL/min — ABNORMAL LOW (ref >60)
GFR calc non Af Amer: 48 mL/min — ABNORMAL LOW (ref >60)
GFR calc non Af Amer: 53 mL/min — ABNORMAL LOW (ref >60)
GFR calc non Af Amer: 43 mL/min — ABNORMAL LOW (ref >60)
Glucose, Bld: 107 mg/dL — ABNORMAL HIGH (ref 70–99)
Glucose, Bld: 115 mg/dL — ABNORMAL HIGH (ref 70–99)
Glucose, Bld: 119 mg/dL — ABNORMAL HIGH (ref 70–99)
Potassium: 4.2 mEq/L (ref 3.5–5.1)

## 2010-09-25 LAB — CBC
HCT: 27.2 % — ABNORMAL LOW (ref 39.0–52.0)
MCHC: 34.2 g/dL (ref 30.0–36.0)
MCHC: 34.7 g/dL (ref 30.0–36.0)
MCV: 98.7 fL (ref 78.0–100.0)
Platelets: 236 10*3/uL (ref 150–400)
Platelets: 263 10*3/uL (ref 150–400)
Platelets: 174 10*3/uL (ref 150–400)
RBC: 2.74 MIL/uL — ABNORMAL LOW (ref 4.22–5.81)
RDW: 12.4 % (ref 11.5–15.5)
RDW: 12.4 % (ref 11.5–15.5)
RDW: 12.6 % (ref 11.5–15.5)
WBC: 14.4 10*3/uL — ABNORMAL HIGH (ref 4.0–10.5)

## 2010-09-25 LAB — DIFFERENTIAL
Lymphocytes Relative: 10 % — ABNORMAL LOW (ref 12–46)
Lymphs Abs: 1.4 10*3/uL (ref 0.7–4.0)
Neutro Abs: 11.9 10*3/uL — ABNORMAL HIGH (ref 1.7–7.7)
Neutrophils Relative %: 83 % — ABNORMAL HIGH (ref 43–77)

## 2010-09-25 LAB — PROTIME-INR
INR: 3.1 — ABNORMAL HIGH (ref 0.00–1.49)
Prothrombin Time: 31.9 seconds — ABNORMAL HIGH (ref 11.6–15.2)
Prothrombin Time: 35.6 seconds — ABNORMAL HIGH (ref 11.6–15.2)
Prothrombin Time: 38.8 seconds — ABNORMAL HIGH (ref 11.6–15.2)

## 2010-09-26 LAB — BASIC METABOLIC PANEL
BUN: 22 mg/dL (ref 6–23)
BUN: 25 mg/dL — ABNORMAL HIGH (ref 6–23)
BUN: 27 mg/dL — ABNORMAL HIGH (ref 6–23)
BUN: 33 mg/dL — ABNORMAL HIGH (ref 6–23)
BUN: 35 mg/dL — ABNORMAL HIGH (ref 6–23)
BUN: 42 mg/dL — ABNORMAL HIGH (ref 6–23)
BUN: 43 mg/dL — ABNORMAL HIGH (ref 6–23)
CO2: 22 mEq/L (ref 19–32)
CO2: 22 mEq/L (ref 19–32)
CO2: 27 mEq/L (ref 19–32)
CO2: 29 mEq/L (ref 19–32)
Calcium: 7.7 mg/dL — ABNORMAL LOW (ref 8.4–10.5)
Calcium: 8.1 mg/dL — ABNORMAL LOW (ref 8.4–10.5)
Calcium: 8.2 mg/dL — ABNORMAL LOW (ref 8.4–10.5)
Calcium: 8.8 mg/dL (ref 8.4–10.5)
Chloride: 101 mEq/L (ref 96–112)
Chloride: 102 mEq/L (ref 96–112)
Chloride: 89 mEq/L — ABNORMAL LOW (ref 96–112)
Chloride: 98 mEq/L (ref 96–112)
Creatinine, Ser: 1.16 mg/dL (ref 0.4–1.5)
Creatinine, Ser: 1.48 mg/dL (ref 0.4–1.5)
Creatinine, Ser: 1.49 mg/dL (ref 0.4–1.5)
Creatinine, Ser: 1.57 mg/dL — ABNORMAL HIGH (ref 0.4–1.5)
Creatinine, Ser: 1.71 mg/dL — ABNORMAL HIGH (ref 0.4–1.5)
Creatinine, Ser: 1.83 mg/dL — ABNORMAL HIGH (ref 0.4–1.5)
GFR calc Af Amer: 43 mL/min — ABNORMAL LOW (ref >60)
GFR calc Af Amer: 44 mL/min — ABNORMAL LOW (ref >60)
GFR calc Af Amer: >60 mL/min (ref >60)
GFR calc non Af Amer: 36 mL/min — ABNORMAL LOW (ref >60)
GFR calc non Af Amer: 39 mL/min — ABNORMAL LOW (ref >60)
GFR calc non Af Amer: 46 mL/min — ABNORMAL LOW (ref >60)
Glucose, Bld: 116 mg/dL — ABNORMAL HIGH (ref 70–99)
Glucose, Bld: 116 mg/dL — ABNORMAL HIGH (ref 70–99)
Glucose, Bld: 128 mg/dL — ABNORMAL HIGH (ref 70–99)
Glucose, Bld: 130 mg/dL — ABNORMAL HIGH (ref 70–99)
Glucose, Bld: 133 mg/dL — ABNORMAL HIGH (ref 70–99)
Glucose, Bld: 180 mg/dL — ABNORMAL HIGH (ref 70–99)
Potassium: 3.9 mEq/L (ref 3.5–5.1)
Sodium: 131 mEq/L — ABNORMAL LOW (ref 135–145)
Sodium: 131 mEq/L — ABNORMAL LOW (ref 135–145)

## 2010-09-26 LAB — HEPATIC FUNCTION PANEL
ALT: 17 U/L (ref 0–53)
ALT: 19 U/L (ref 0–53)
Albumin: 2.1 g/dL — ABNORMAL LOW (ref 3.5–5.2)
Alkaline Phosphatase: 81 U/L (ref 39–117)
Alkaline Phosphatase: 83 U/L (ref 39–117)
Indirect Bilirubin: 0.6 mg/dL (ref 0.3–0.9)
Total Protein: 5.8 g/dL — ABNORMAL LOW (ref 6.0–8.3)
Total Protein: 6.3 g/dL (ref 6.0–8.3)

## 2010-09-26 LAB — URINALYSIS, MICROSCOPIC ONLY
Glucose, UA: NEGATIVE mg/dL
Hgb urine dipstick: NEGATIVE
Protein, ur: 30 mg/dL — AB
Specific Gravity, Urine: 1.016 (ref 1.005–1.030)

## 2010-09-26 LAB — CARDIAC PANEL(CRET KIN+CKTOT+MB+TROPI)
Relative Index: 1.2 (ref 0.0–2.5)
Total CK: 154 U/L (ref 7–232)
Total CK: 188 U/L (ref 7–232)
Troponin I: 0.02 ng/mL (ref 0.00–0.06)

## 2010-09-26 LAB — CBC
HCT: 26.5 % — ABNORMAL LOW (ref 39.0–52.0)
HCT: 31 % — ABNORMAL LOW (ref 39.0–52.0)
Hemoglobin: 10.7 g/dL — ABNORMAL LOW (ref 13.0–17.0)
Hemoglobin: 9.2 g/dL — ABNORMAL LOW (ref 13.0–17.0)
MCHC: 34.4 g/dL (ref 30.0–36.0)
MCHC: 34.5 g/dL (ref 30.0–36.0)
MCHC: 34.7 g/dL (ref 30.0–36.0)
MCHC: 34.8 g/dL (ref 30.0–36.0)
MCHC: 34.8 g/dL (ref 30.0–36.0)
MCHC: 35 g/dL (ref 30.0–36.0)
MCV: 100.1 fL — ABNORMAL HIGH (ref 78.0–100.0)
MCV: 99.4 fL (ref 78.0–100.0)
MCV: 99.5 fL (ref 78.0–100.0)
MCV: 99.7 fL (ref 78.0–100.0)
Platelets: 150 10*3/uL (ref 150–400)
Platelets: 151 10*3/uL (ref 150–400)
Platelets: 152 10*3/uL (ref 150–400)
Platelets: 170 10*3/uL (ref 150–400)
Platelets: 195 10*3/uL (ref 150–400)
Platelets: 213 10*3/uL (ref 150–400)
RDW: 12.2 % (ref 11.5–15.5)
RDW: 12.2 % (ref 11.5–15.5)
RDW: 12.3 % (ref 11.5–15.5)
RDW: 12.4 % (ref 11.5–15.5)
RDW: 12.5 % (ref 11.5–15.5)
RDW: 12.5 % (ref 11.5–15.5)
RDW: 12.8 % (ref 11.5–15.5)
WBC: 7.4 10*3/uL (ref 4.0–10.5)
WBC: 8.6 10*3/uL (ref 4.0–10.5)
WBC: 8.7 10*3/uL (ref 4.0–10.5)

## 2010-09-26 LAB — CROSSMATCH: Antibody Screen: NEGATIVE

## 2010-09-26 LAB — URINALYSIS, ROUTINE W REFLEX MICROSCOPIC
Glucose, UA: NEGATIVE mg/dL
Leukocytes, UA: NEGATIVE
Protein, ur: 30 mg/dL — AB
pH: 5.5 (ref 5.0–8.0)

## 2010-09-26 LAB — COMPREHENSIVE METABOLIC PANEL
AST: 30 U/L (ref 0–37)
Albumin: 3.1 g/dL — ABNORMAL LOW (ref 3.5–5.2)
BUN: 28 mg/dL — ABNORMAL HIGH (ref 6–23)
Chloride: 94 mEq/L — ABNORMAL LOW (ref 96–112)
Creatinine, Ser: 1.57 mg/dL — ABNORMAL HIGH (ref 0.4–1.5)
GFR calc Af Amer: 52 mL/min — ABNORMAL LOW (ref >60)
Potassium: 4.3 mEq/L (ref 3.5–5.1)
Total Bilirubin: 0.8 mg/dL (ref 0.3–1.2)
Total Protein: 6.9 g/dL (ref 6.0–8.3)

## 2010-09-26 LAB — MAGNESIUM: Magnesium: 2 mg/dL (ref 1.5–2.5)

## 2010-09-26 LAB — APTT: aPTT: 30 seconds (ref 24–37)

## 2010-09-26 LAB — URINE CULTURE

## 2010-09-26 LAB — LIPID PANEL
Cholesterol: 90 mg/dL (ref 0–200)
LDL Cholesterol: 49 mg/dL (ref 0–99)

## 2010-09-26 LAB — PROTIME-INR
INR: 1 (ref 0.00–1.49)
INR: 1.1 (ref 0.00–1.49)
INR: 2.7 — ABNORMAL HIGH (ref 0.00–1.49)
Prothrombin Time: 13.1 seconds (ref 11.6–15.2)
Prothrombin Time: 14.1 seconds (ref 11.6–15.2)
Prothrombin Time: 28.1 seconds — ABNORMAL HIGH (ref 11.6–15.2)

## 2010-09-26 LAB — LIPASE, BLOOD: Lipase: 25 U/L (ref 11–59)

## 2010-09-26 LAB — URINE MICROSCOPIC-ADD ON

## 2010-10-04 ENCOUNTER — Other Ambulatory Visit (INDEPENDENT_AMBULATORY_CARE_PROVIDER_SITE_OTHER): Payer: Medicare Other | Admitting: Internal Medicine

## 2010-10-04 DIAGNOSIS — I10 Essential (primary) hypertension: Secondary | ICD-10-CM

## 2010-10-05 LAB — CULTURE, BLOOD (ROUTINE X 2)
Culture: NO GROWTH
Culture: NO GROWTH

## 2010-10-05 LAB — URINALYSIS, ROUTINE W REFLEX MICROSCOPIC
Glucose, UA: NEGATIVE mg/dL
Ketones, ur: NEGATIVE mg/dL
Nitrite: NEGATIVE
Protein, ur: NEGATIVE mg/dL
pH: 7 (ref 5.0–8.0)

## 2010-10-05 LAB — BASIC METABOLIC PANEL
BUN: 21 mg/dL (ref 6–23)
Creatinine, Ser: 1.36 mg/dL (ref 0.4–1.5)
GFR calc non Af Amer: 51 mL/min — ABNORMAL LOW (ref >60)
Glucose, Bld: 116 mg/dL — ABNORMAL HIGH (ref 70–99)

## 2010-10-05 LAB — MAGNESIUM: Magnesium: 1.8 mg/dL (ref 1.5–2.5)

## 2010-10-05 LAB — CBC
HCT: 36.5 % — ABNORMAL LOW (ref 39.0–52.0)
Platelets: 205 10*3/uL (ref 150–400)
RDW: 13.3 % (ref 11.5–15.5)
WBC: 6.8 10*3/uL (ref 4.0–10.5)

## 2010-10-05 LAB — DIFFERENTIAL
Basophils Absolute: 0 10*3/uL (ref 0.0–0.1)
Eosinophils Absolute: 0.2 10*3/uL (ref 0.0–0.7)
Eosinophils Relative: 3 % (ref 0–5)
Lymphocytes Relative: 22 % (ref 12–46)
Lymphs Abs: 1.5 10*3/uL (ref 0.7–4.0)
Neutrophils Relative %: 64 % (ref 43–77)

## 2010-10-05 LAB — GLUCOSE, CAPILLARY: Glucose-Capillary: 110 mg/dL — ABNORMAL HIGH (ref 70–99)

## 2010-10-05 LAB — CK TOTAL AND CKMB (NOT AT ARMC): Total CK: 156 U/L (ref 7–232)

## 2010-10-11 ENCOUNTER — Other Ambulatory Visit: Payer: Self-pay | Admitting: Dermatology

## 2010-10-15 ENCOUNTER — Encounter: Payer: Self-pay | Admitting: Internal Medicine

## 2010-10-25 ENCOUNTER — Other Ambulatory Visit: Payer: Self-pay | Admitting: *Deleted

## 2010-10-25 NOTE — Telephone Encounter (Signed)
Refill needs to be faxed to [facility] at [address], [place], [place].  Fax to [phone].

## 2010-10-26 MED ORDER — METHADONE HCL 5 MG PO TABS: 5.0000 mg | ORAL_TABLET | Freq: Three times a day (TID) | ORAL | Status: DC

## 2010-10-29 ENCOUNTER — Ambulatory Visit (INDEPENDENT_AMBULATORY_CARE_PROVIDER_SITE_OTHER): Payer: Medicare Other | Admitting: Internal Medicine

## 2010-10-29 ENCOUNTER — Encounter: Payer: Self-pay | Admitting: Internal Medicine

## 2010-10-29 VITALS — BP 146/67 | HR 61 | Temp 97.0°F | Ht 66.0 in | Wt 187.4 lb

## 2010-10-29 DIAGNOSIS — F329 Major depressive disorder, single episode, unspecified: Secondary | ICD-10-CM

## 2010-10-29 NOTE — Assessment & Plan Note (Signed)
Discussed living situation.

## 2010-10-29 NOTE — Telephone Encounter (Signed)
Faxed, mailed 

## 2010-10-29 NOTE — Progress Notes (Signed)
42 man from nursing home.  Seems more content than usual -- has new roommate. Recently had extensive derm-surgery on scalp by Dr. Denna Haggard.  Someone comes out to re-dress wound. Has gained 12 lbs.  Denies over-eating, but has no edema.  No CV sx as yet but advised less food and cutting back on supplemental ensure. Had chemistries checked 2 mos ago. Signed for extended PT.   Cor regular.  Lungs clear.

## 2010-11-02 NOTE — Op Note (Signed)
NAME:  Zachary [name], Zachary [name]                ACCOUNT NO.:  [id number]   MEDICAL RECORD NO.:  [id number]           PATIENT TYPE:   LOCATION:                                 FACILITY:   PHYSICIAN:  Kathalene Frames. Mayer Camel, M.D.   DATE OF BIRTH:  1930/06/09   DATE OF PROCEDURE:  01/27/2009  DATE OF DISCHARGE:                               OPERATIVE REPORT   PREOPERATIVE DIAGNOSIS:  Left femoral neck fracture.   POSTOPERATIVE DIAGNOSIS:  Left femoral neck fracture.   PROCEDURE:  Left hemi hip arthroplasty cemented using a DePuy #5 Summit  basic stem, NK +0 49-mm monopolar head 11-mm tip, and a #3 cement  restrictor.   SURGEON:  Kathalene Frames. Mayer Camel, MD   FIRST ASSISTANT:  Leafy Kindle, PA   ANESTHETIC:  Endotracheal.   ESTIMATED BLOOD LOSS:  300 mL.   FLUID REPLACEMENT:  1200 mL of crystalloid.   DRAINS PLACED:  Foley catheter.   URINE OUTPUT:  About 200 mL.   INDICATIONS FOR PROCEDURE:  A 77 year old gentleman admitted by the  Medicine Service with a painful left lower extremity.  Orthopedic  consultation was obtained because the radiographs of the hip, femur,  knee from 2 days prior were negative after a fall.  We obtained a CT  scan that unfortunately showed a displaced left femoral neck fracture  yesterday evening.  He was already on heparin.  Therefore, we elected to  wait until today to go ahead and perform a left hemi hip arthroplasty  after taking him off the heparin.  Risks and benefits of surgery were  discussed, questions answered.  Because of his displaced femoral neck  fracture in a 77 year old, it was felt that the hemi hip arthroplasty as  the best chance of getting him up walking again.  Risks and benefits of  surgery were discussed, questions answered.   DESCRIPTION OF PROCEDURE:  The patient identified by armband and  received 2 g of Ancef IV in the preoperative holding area at [facility].  He was then taken to operating room 5.  Appropriate  anesthetic monitors  were attached, and general endotracheal anesthesia  induced with the patient in the supine position.  Foley catheter was  inserted.  He was rolled into the right lateral decubitus position fixed  there with the Stulberg Mark II pelvic clamp.  The left lower extremity  was prepped and draped in usual sterile fashion from the ankle to the  hemipelvis.  Time-out procedure performed.  Skin along the lateral hip  and thigh infiltrated with 20 mL of 0.5% Marcaine and epinephrine  solution.  We began the operation by making an 18-cm incision centered  over the greater trochanter allowing a posterolateral approach to the  hip joint.  Small bleeders in the skin and subcutaneous tissue  identified and cauterized.  The IT band was cut inline with the skin  incision exposing the greater trochanter.  The hip had already  contracted down a fair amount.  Cobra retractors were placed between the  gluteus minimus in the superior hip joint capsule in between the  quadratus femoris and the inferior hip joint capsule.  This isolated the  short external rotators and piriformis which was cut off their insertion  on the intertrochanteric crest exposing the hip joint capsule which was  developed into an acetabular-based flap going from posterior-superior  out over the femoral neck and exiting posteroinferior.  This was also  tagged with #2 Ethibond sutures and exposed the femoral neck fracture.  Using a power oscillating saw, we performed a neck cut about one half of  the fingerbreadth above the lesser trochanter anticipating the  contracture he had developed and inline with the fracture.  We then  extracted the femoral head with the corkscrew, measured it 49 mm, and  performed trial reductions with a 48- and a 49-mm trial ball.  The 49  had the best fit and fill.  The hip was then flexed and internally  rotated and entered with the initiating reamer.  The proximal femur  followed by the lateral reamer followed  by broaching up to a #5 broach.  This had the best fit and fill, and a calcar milling device was used to  finish off the neck cut.  We then performed a trial reduction with an NK  +0 49-mm monopolar head.  Once the trial reduction performed, we checked  motion, flexed them to 90 degrees and to 70 with internal rotation and  also on full extension external rotation.  The hip could not be  dislocated.  At this point, the trial components were removed.  We sized  for a #3 cement restrictor distally in the femur.  This was inserted.  The femoral canal was pulse lavaged, cleaned and dried with suction and  sponges.  A double batch of DePuy HV cement with 1500 mg of Zinacef was  then mixed and injected into the proximal femur under pressure followed  by a #5 Summit basic stem with an 11-mm tip.  It was placed in the same  version as the calcar.  This was about 20 degrees.  The stem was held in  compression as the cement cured.  Prior to it curing, we actually  removed any excess cement from around the proximal end of the stem.  Once the cement had cured, an NK +0 49-mm monopolar ball was hammered  into place.  The hip was again reduced.  Stability checked and found to  be excellent.  The wound was once again pulse lavaged clean, and then  the capsular flap and the short external rotators were repaired back to  the intertrochanteric crest through drill holes with #2 Ethibond suture.  The IT band was then closed with running #1 Vicryl suture.  The  subcutaneous tissue with 0-0 and 2-0 undyed Vicryl suture, and the skin  with staples.  A dressing of Xeroform and Mepilex was then applied.  The  patient was unclamped, rolled supine, awakened, and taken to the  recovery room without difficulty.      Kathalene Frames. Mayer Camel, M.D.  Electronically Signed     FJR/MEDQ  D:  01/27/2009  T:  01/28/2009  Job:  [id number]

## 2010-11-02 NOTE — Assessment & Plan Note (Signed)
[place]                            CARDIOLOGY OFFICE NOTE   ENNIS, WITKOWSKI                       MRN:          [id number]  DATE:04/25/2008                            DOB:          March 13, 1930    PRIMARY CARE PHYSICIAN:  C. Milta Deiters, MD   REASON FOR PRESENTATION:  The patient with chest pain.   HISTORY OF PRESENT ILLNESS:  The patient is 77 years old.  He has been  followed by primary care and actually has been seeing Dr. Tyrell Antonio.  Recently, he has been describing some chest discomfort.  There was also  apparently an episode of chest discomfort while he was driving that he  described to Dr. Tyrell Antonio.  He cannot really expound on this.  There was  some episode where he pulled into a fire station.  Suddenly, like he was  more confused and in pain, but I cannot get a description of this.  He  describes chest discomfort laying down in particular.  It is sharp.  It  is in his upper chest.  It has been going on for several months, but has  been getting slowly worse.  He also has a weakness in his chest.  This, he cannot quantify or qualify more.  He cannot bring any of these  things on.  The most exerting thing he does is walks a dog.  This does  not cause those symptoms.  He is very unsteady on his feet and has  falls.  This has apparently been a longstanding problem.  He also has  deconditioning, which has been addressed during hospitalization.  With  his current level of activity, he does not get any significant shortness  of breath.  He does not have any resting PND or orthopnea.  He does feel  his heart somewhat palpating, but this is not a sustained  tachyarrhythmia and not associated with any of these symptoms.  He was  referred for evaluation of this chest discomfort.  Of note, he was  hospitalized earlier this year.  He had some lower extremity cellulitis.  He reports some venous thrombosis, but I do not see this description.  He is  currently not on Coumadin.  He has had chronic lower extremity  swelling with this.  He did have an echocardiogram which demonstrated an  EF of 55%-60% with no significant wall motion or valvular abnormalities.  There was no description of diastolic dysfunction.   PAST MEDICAL HISTORY:  1. Chronic renal insufficiency.  2. Recent bilateral lower extremity cellulitis.  3. Chronic lower extremity edema with apparent venous stasis.  4. Depression.  5. Anxiety.  6. Diverticulosis.  7. Chronic pain treated with methadone.  8. Previous alcoholism.  9. Apparent irritable bowel syndrome.  10.Benign prostatic hypertrophy.  11.Basal cell skin cancer.  12.Gastroesophageal reflux disease.   PAST SURGICAL HISTORY:  Appendectomy.   ALLERGIES:  None.   MEDICATIONS:  1. Librium.  2. Aspirin 81 mg daily.  3. Zocor 20 mg daily.  4. Prilosec 20 mg daily.  5. MiraLax.  6. Paxil 20 mg daily.  7. Benazepril 40 mg daily.  8. Flomax 0.4 mg daily.  9. Furosemide 40 mg daily.   SOCIAL HISTORY:  The patient is a widower.  He has three children.  He  lives by himself.  He does not smoke.  He has been sober for several  years.   FAMILY HISTORY:  Noncontributory for early coronary artery disease.  His  father died suddenly of myocardial function at 77.  His brother is dead  and he had some kind of heart disease, but he does not know that  history.   REVIEW OF SYSTEMS:  Positive for dizziness, ataxia, reflux, and  constipation.  Negative for all other systems.   PHYSICAL EXAMINATION:  GENERAL:  The patient is in no distress.  VITAL SIGNS:  Blood pressure 187/72, heart rate 62 and regular, weight  196 pounds, and body mass index 30.  HEENT:  Eyelids are unremarkable.  Pupils are equal, round, and reactive  to light.  Fundi not visualized.  Oral mucosa, unremarkable.  NECK:  No jugular vein distention, at 45 degrees.  Carotid upstrokes  brisk and symmetrical.  No bruits.  No thyromegaly.   LYMPHATICS:  No cervical, axillary, or inguinal adenopathy.  LUNGS:  Clear to auscultation bilaterally.  BACK:  No costovertebral angle tenderness.  CHEST:  Unremarkable.  HEART:  PMI not displaced or sustained, S1 and S2 within normal limits.  No S3.  No S4.  No clicks, rubs, or murmurs.  ABDOMEN:  Flat, positive bowel sounds normal in frequency and pitch.  No  bruits.  No rebound, guarding, or midline pulsatile mass.  No  hepatomegaly.  No splenomegaly.  SKIN:  No rashes, no nodules.  EXTREMITIES:  2+ upper pulses, 2+ dorsalis pedis bilaterally.  Chronic  right greater than left lower extremity edema to the knees with chronic  venous stasis changes.  NEUROLOGIC:  Oriented to person, place, and time.  Cranial nerves II  through XII grossly intact.  Motor grossly intact.   EKG, sinus rhythm, rate 61, axis within normals, intervals within normal  limits, no acute ST-wave changes.   ASSESSMENT/PLAN:  1. Chest discomfort.  The patient's chest is somewhat atypical.  He      does have cardiovascular risk factors.  At this point, I will plan      a stress test.  He would not able to walk on a treadmill, so we      will have adenosine Myoview.  Further evaluation will be based on      these results.  2. Lower extremity edema.  This has been a chronic problem.  He has      had this evaluated with apparent venous Dopplers.  I will defer to      his primary care physician.  He knows to wear his compression      stockings and keep his feet elevated which he has not been doing      routinely.  3. Hypertension.  Blood pressure is elevated.  I have reviewed his      recent office visits with his primary care doctor.  They did      increase his diuretic and do plan to address his blood pressure      again in a few weeks.  I think since it has been predominantly      systolic dysfunction, a low dose of a calcium-channel blocker such      as Norvasc might be reasonable.  This should not contribute  to his      lower extremity edema at a low dose.  I do believe that this needs      to be addressed at his next visit.  4. Dyslipidemia per his primary care physician.  I would suggest a      goal LDL less than 100 and HDL greater than 40.  5. Falls.  The patient is having frequent falls.  I would suggest a      neuro evaluation, but again we will defer to his primary care      doctor.     Minus Breeding, MD, [facility]  Electronically Signed    JH/MedQ  DD: 04/25/2008  DT: 04/26/2008  Job #: [id number]   cc:   C. Milta Deiters, M.D.

## 2010-11-02 NOTE — Discharge Summary (Signed)
NAME:  Zachary [name], Zachary [name]                ACCOUNT NO.:  [id number]   MEDICAL RECORD NO.:  [id number]          PATIENT TYPE:  OIB   LOCATION:  [id number]                         FACILITY:  [facility]   PHYSICIAN:  Tye Savoy, NP     DATE OF BIRTH:  18-Jan-1930   DATE OF ADMISSION:  11/13/2008  DATE OF DISCHARGE:  11/14/2008                               DISCHARGE SUMMARY   DISCHARGE DIAGNOSES:  1. Heme-positive stool, status post normal colonoscopy, this      admission.  2. Renal insufficiency.  3. Hyperlipidemia.  4. Gastroesophageal reflux disease.  5. Depression.  6. History of gastritis.  7. History of esophageal stricture in February 2008.  8. History of skin cancer.  9. Hypertension.   DISPOSITION:  Home in stable condition.   DISCHARGE MEDICATIONS:  The patient to continue his home medications,  which include:  1. Paroxetine 20 mg daily.  2. Simvastatin 20 mg daily.  3. Omeprazole.  4. Methadone.  5. Benazepril.  6. Darvon.  7. Baby aspirin.  8. Librium 10 mg 5 capsules daily.   CONSULTATIONS:  None.   REQUESTED PROCEDURES:  Colonoscopy, just prior to this admission.   HOSPITAL COURSE:  [name] is a 77 year old male who had a  colonoscopy by Dr. Deatra Ina this morning for evaluation of Hemoccult  positive stool.  Colonoscopy was normal.  Unfortunately, post procedure,  the patient did not have a right home from the [facility] and  had to be admitted overnight for observation.  The patient was continued  on his home medications.  Vital signs remained normal throughout his  stay.  The patient had an uneventful stay at the hospital.  He was  stable on Nov 14, 2008, for discharge.   DISCHARGE DIAGNOSES:  1. Hemoccult positive stool, normal colonoscopy on Nov 13, 2008.  The      patient followed by Dr. Deatra Ina.  2. Renal insufficiency.  3. Hyperlipidemia.  4. Gastroesophageal reflux disease.  5. Depression.  6. History of gastritis.  7. History of esophageal  stricture in February 2008.  8. History of skin cancer.  9. Hypertension.  10.Bilateral lower extremity edema and erythema followed by the      patient's primary care physician.   The patient was asked to follow up with Dr. Deatra Ina on an as-needed  basis.      Tye Savoy, NP     PG/MEDQ  D:  11/14/2008  T:  11/15/2008  Job:  [id number]

## 2010-11-02 NOTE — Discharge Summary (Signed)
NAME:  Zachary [name], Zachary [name]                ACCOUNT NO.:  [id number]   MEDICAL RECORD NO.:  [id number]          PATIENT TYPE:  INP   LOCATION:  5001                         FACILITY:  [facility]   PHYSICIAN:  C. Milta Deiters, M.D.DATE OF BIRTH:  01/11/1930   DATE OF ADMISSION:  01/24/2009  DATE OF DISCHARGE:  02/04/2009                               DISCHARGE SUMMARY   ADDENDUM:  This is an addendum to the previous dictation summary with  the Job ID [id number].   The date of discharge and previous summary was February 03, 2009.  The  patient was not discharged on that day and was discharged the next day  that is February 04, 2009, and was sent to [facility], so  please note the date of discharge is February 04, 2009.      Lester Wilsall, MD  Electronically Signed      C. Milta Deiters, M.D.  Electronically Signed    MD/MEDQ  D:  02/04/2009  T:  02/05/2009  Job:  [id number]
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
## 2010-11-02 NOTE — Discharge Summary (Signed)
NAME:  Zachary [name], Zachary [name]                ACCOUNT NO.:  [id number]   MEDICAL RECORD NO.:  [id number]          PATIENT TYPE:  INP   LOCATION:  3703                         FACILITY:  [facility]   PHYSICIAN:  C. Milta Deiters, M.D.DATE OF BIRTH:  07-29-1929   DATE OF ADMISSION:  07/25/2008  DATE OF DISCHARGE:  07/26/2008                               DISCHARGE SUMMARY   DISCHARGE DIAGNOSES:  1. Confusion per patient history, this was not clinically evident.  2. Poorly-controlled hypertension.  3. Left lower extremity wound/cellulitis.  4. Chronic renal insufficiency.  5. Depression.  6. Anxiety.  7. Benign prostatic hypertrophy.  8. History of alcohol abuse.   DISCHARGE MEDICATIONS:  1. Librium 10 mg 5 tablets p.o. daily.  2. Darvocet-N 100 one tablet q.8 h. p.r.n.  3. Aspirin 81 mg p.o. daily.  4. Zocor 20 mg p.o. daily.  5. Methadone 5 mg p.o. q.8 h. p.r.n.  6. Prilosec 20 mg p.o. daily.  7. Paxil 20 mg p.o. nightly.  8. Benazepril 40 mg p.o. daily.  9. Flomax 0.4 mg p.o. daily.  10.Lasix 40 mg p.o. daily.  11.Doxycycline 100 mg p.o. b.i.d.  12.Norvasc 5 mg p.o. daily.   DISPOSITION AND FOLLOWUP:  The patient is to follow up with Dr. Stann Mainland  in the outpatient clinic.  At that time, his blood pressure and  compliance with his antihypertensive medication needs to be addressed.  He also needs followup of his anemia because this was not worked up  while inpatient.   PROCEDURES PERFORMED:  No procedures were performed.   CONSULTATIONS:  No consultations were obtained.   ADMISSION HISTORY AND PHYSICAL:  The patient is a 77 year old male with  past medical history of chronic wound of his left lower extremity and  poorly-controlled hypertension who presented to the emergency room  complaining of elevated blood pressure and confusion.  He was seen in  the clinic the day prior to admission for nonhealing wound and his blood  pressure was found to be elevated.  He was very upset about his  elevated  blood pressure, but notes that he had not been taking his  antihypertensive medications regularly.  His blood pressure was treated  in the emergency room with clonidine, it came down, but the patient  refused to leave the emergency room and said he was very anxious and was  afraid he might die if he was home.  He denied any chest pain, shortness  of breath, abdominal pain, and bowel and bladder symptoms.   PHYSICAL EXAMINATION:  VITALS:  On admission, temperature 97 degrees,  blood pressure 191/92, heart rate 81, respiratory rate 20, and O2 sat  99% on room air.  GENERALLY:  He is alert and oriented, in no acute distress.  HEENT:  Eye exam, pupils equally round and reactive to light,  extraocular motions intact, anicteric.  ENT exam, moist mucous  membranes, no oropharyngeal exudates.  NECK:  Supple, full range of motion, no thyromegaly.  RESPIRATORY:  Clear to auscultation bilaterally with normal respiratory  effort and good air movement.  CARDIOVASCULAR:  Regular rate and rhythm with  a normal S1, S2.  ABDOMEN:  Good bowel sounds, soft, nontender, nondistended, no  hepatosplenomegaly.  EXTREMITIES:  Left lower extremity with 2-3 cm denuded skin area with no  purulent material with sign of abscess.  NEUROLOGIC:  Alert and oriented x3, no focal deficits, cranial nerves II  through XII were intact, no cerebellar signs, gait normal but slow.  PSYCH:  Slightly anxious.   ADMISSION LABORATORY DATA:  White blood cell count 6.8, hemoglobin 12.7,  MCV 97.6, platelet count 205.  Sodium 139, potassium 3.9, chloride 102,  bicarb 31, BUN 21, creatinine 1.36, glucose 116, calcium 8.8.  Urinalysis completely within normal limits.  CK 156, CK-MB 3.8, relative  index 2.4, troponin 0.02, magnesium 1.8.  Blood cultures negative x2.   DIAGNOSTIC IMAGING:  CAT scan of the head showed:  1. No acute intracranial abnormality.  2. Severe changes of small vessel disease.  White matter diffusely       stable since August 2009.   HOSPITAL COURSE:  1. Confusion.  The patient reported confusion.  However, his mini-      mental status exam score was 29/30.  He did not appear clinically      confused, however he did appear somewhat anxious about being      discharged home from the emergency room and so, he was brought in      for observation overnight and did not have any events on telemetry.      His cardiac enzymes were negative and it was felt like this may be      related to his anxiety and depression.  2. Hypertension.  His blood pressure was elevated upon arrival and      after a dose of clonidine, came down to systolics in the [id number] range.      He was restarted on his home medications and his blood pressure      remained in the 110s-150s during his hospitalization.  He was      counseled extensively on the importance of taking his medications      or else his blood pressure is likely not going to be under good      control.  3. Left lower extremity wound.  The patient was continued on      doxycycline that was started in the outpatient setting.  4. Chronic renal insufficiency.  The patient's creatinine appeared to      be around his baseline at 1.3.  5. Anemia.  The patient was noted to have a normocytic anemia.  He has      had a normal B12 recently and it was felt like this could safely be      worked up in the outpatient setting.   DISCHARGE VITALS:  Temperature 97.5, blood pressure 157/85, heart rate  67, respiratory rate 20, and O2 sat 97% on room air.      Junius Finner, MD  Electronically Signed      C. Milta Deiters, M.D.  Electronically Signed    VW/MEDQ  D:  07/28/2008  T:  07/29/2008  Job:  [id number]

## 2010-11-02 NOTE — Discharge Summary (Signed)
NAME:  Zachary [name], Zachary [name]                ACCOUNT NO.:  [id number]   MEDICAL RECORD NO.:  [id number]          PATIENT TYPE:  INP   LOCATION:  5523                         FACILITY:  [facility]   PHYSICIAN:  C. Milta Deiters, M.D.DATE OF BIRTH:  [birth year]   DATE OF ADMISSION:  02/13/2008  DATE OF DISCHARGE:  02/15/2008                               DISCHARGE SUMMARY   DISCHARGE DIAGNOSES:  1. Bilateral lower extremity cellulitis.  2. Deconditioning.  3. Chronic renal insufficiency.  4. Abdominal pain.  5. Hypertension.   DISCHARGE MEDICATIONS:  1. Flomax 0.4 mg once daily.  2. Methadone 5 mg three times 1 tablet daily.  3. Paxil 20 mg one tablet before going to bed.  4. Lasix 40 mg one tablet daily.  5. Aspirin 81 mg one tablet daily.  6. Librium 40 mg once daily.  7. Vicodin 5/300 one tablet every 6 hours for pain when needed.  8. Artifical tears solution 1-2 drops every 4-6 hours.  9. Prilosec 20 mg once daily.   DISPOSITION:  The patient will be followed by Dr. Junius Finner on  February 09, 2008, at 2 p.m. in the outpatient clinic at [place]:  None.   CONSULTATIONS:  None.   ADMITTING HISTORY AND PHYSICAL:  Zachary [name] is a 77 year old white male  with a past medical history of hypertension, depression, chronic renal  insufficiency, basal cell carcinoma, and esophageal ulcers presents with  a bilateral lower leg erythema and swelling.  He reports that 7 weeks  ago, he noticed that his right foot was bleeding heavily.  He states  that the blood was squirting from his vein, and then it became thicker  and did not stop for a while.  He was attended at the [facility]  Emergency Department on January 05, 2008.  He was sutured with self-  absorbing material and his bleeding had stopped.  Total sutures were 1-  2, below right meleolus. He reports that his feet continued to hurt  after he went home and that his legs started being red ever since  after  the ED visit.   He was then seen at the outpatient clinic for four different occasions  with worsening lower extremity edema and persistence of cellulitis like  clinical findings in his lower extremities.  He was treated with a 10-  day course of doxycycline and clindamycin that was started on January 22, 2008.  He finished his course, however, did not improve clinically, and  he came back to the clinic on February 13, 2008.  It was then decided to  admit the patient to further evaluate his bilateral leg edema as well as  the redness.  The patient also reports generalized shortness of breath  and weakness in his leg.  He reports spontaneous falls and decreased  mobility.   ALLERGIES:  No known drug allergies.   PAST MEDICAL HISTORY:  1. Hypertension.  2. Diverticulosis.  3. Depression.  4. Chronic renal insufficiency.  5. Basal cell carcinoma and radiation to his left eye, possibly due to  basal cell carcinoma of the eyelid.   REVIEW OF SYMPTOMS:  He also reports dysuria and urinary frequency.  He  also reports hesitancy and incomplete emptying of the bladder.  He also  reports dyspnea on exertion, but no orthopnea.  He reports dizziness,  imbalance, confusion at times, and falls.  Rest of the review of  symptoms is as per HPI.   PHYSICAL EXAMINATION:  VITAL SIGNS:  On presentation, temperature 97.3,  blood pressure 153/65, pulse 72, and O2 saturation 98% on room air.  GENERAL:  In no apparent distress.  HEENT:  Eyes, EOMI.  PERRLA.  NECK:  Supple.  No mass.  No lymphadenopathy.  RESPIRATORY SYSTEM:  Normal breath sounds bilaterally.  No wheezing.  CARDIOVASCULAR SYSTEM:  Normal first and second heart sounds.  No  murmur.  GASTROINTESTINAL SYSTEM:  Soft.  Suprapubic tenderness present.  No  hepatosplenomegaly.  No free fluids.  EXTREMITIES:  Right and left leg edema extending up to the thigh,  slightly warm and erythematous skin denuded at places, dry and scaly in   the lower leg.  Arms were normal, however, he had several hypo and hyper  pigmented spots.  NODES:  No lymphadenopathy.  MUSCULOSKELETAL SYSTEM:  Normal except bilaterally knee had very limited  flexion and extension.  NEUROLOGIC:  Oriented x3.  Normal motor and sensory function in upper  extremity.  EXTREMITIES:  Motor ability decreased thigh flexion and extension, +4  knee flexion and extension, +4 below ankle plantar flexion, +3 plantar  extension, and +3 plantar flexion.  No ankle clonus.  Knee reflexes +2.  PSYCHIATRIC:  Normal affect.  Stable mood.   LABORATORY DATA ON PRESENTATION:  Sodium 133, potassium of 4.2, chloride  of 99, bicarb of 28, BUN of 20, and creatinine of 1.32.  Estimated GFR  for non-African American around 53, bilirubin 0.5, alkaline phosphatase  of 99, AST 34, ALT 17, total protein 6.4, albumin 3.5, and calcium 8.9.  HbA1c 5.6, thyroid stimulating hormone 1.65, B12 589, folate 1070, and C-  reactive protein 0.5.  Urine negative for glucose, bilirubin, ketones,  blood, proteins, nitrites, and leukocyte esterase.  Negative rheumatoid  arthritis factor, RPR, and ANA antibody.  Two blood cultures negative.   Foot x-ray, bilateral soft tissue swelling.  No bony abnormality.  Tibia  and fibular x-ray, old healed distal left fibular fracture.  Bilateral  femur x-ray.  Normal abdominal x-ray.  Normal bowel-gas pattern.  Diffuse osteopenia.   HOSPITAL COURSE:  1. Bilateral lower leg cellulitis.  The patient was admitted and      started with intravascular treatment of vancomycin and Zosyn on day      1.  The labs were sent to investigate possible infection, and two      times blood cultures were negative.  Urine microscopical      examination was negative.  White blood cell count, ESR and C-      reactive protein were not significantly elevated excluding the      possibility of active infection.  Zosyn was discontinued on day 2      of admission.  The patient did  not spike fever during the      hospitalization.  His white blood cell count remained stable.      After observation in the hospital and further discussion with the      medical teaching service team, it was decided that it is likely      that the patient does not have  active cellulitis, however, he has      irritation of the skin likely secondary to persistent edema.  To      investigate the cause of the edema, a sonogram was performed for      the lower extremity DVT.  It was negative for superficial or deep      vein thrombosis as well as Baker cyst formation.  The patient was      discharged home with advise to elevate legs above heart to      facilitate venous drainage.  Lasix dose was increased from 20 mg      p.o. to 40 mg p.o. during the hospital stay, and the patient was      discharged on 40 mg p.o. once daily to help drain the fluids from      his leg.  The patient will be seen by Dr. Redmond Pulling in the outpatient      clinic and reassess the patient's edema as well as erythema in the      lower extremity.  2. Deconditioning.  The patient had a history of falls and decreased      power bilaterally in his legs.  The patient had CT of the head      without contrast on February 13, 2008, that showed progressive      chronic small-vessel white matter ischemic changes in both cerebral      hemispheres, small area of ischemic changes in the genu on the      right internal capsule, stable; mild diffuse cerebral and      cerebellar atrophy.  However, it did not reveal any acute      abnormality as well as ventricular dilatation.  The patient was      advised regarding possibility of home health care and physical      therapy to improve his functioning.  3. Chronic renal insufficiency.  The patient's ACE inhibitor was held      while in the hospital.  His BUN was reassessed and remained at 42      with creatinine of 1.34.  These findings are not different from the      ones observed during  several clinic visits.  We will continue to      monitor the patient's renal insufficiency.  The patient is on Lasix      40 mg p.o., and we will get BMET and CBC when the patient is seen      next in the outpatient clinic.  4. Abdominal pain.  The patient has abdominal pain, mainly suprapubic      in nature.  A plain abdominal did not reveal any abnormal bowel-gas      pattern excluding possibility of intestinal obstruction.  However,      the patient appears to have severe constipation.  The patient was      advised upon diet modification and provided symptomatic relief.  5. Hypertension.  The patient was on Flomax when admitted.  The      patient had a blood pressure of 138/62 when admitted.  His blood      pressure remained between [id number] systolically and between [id number]      diastolically.  The patient was sent home with Lasix 40 mg p.o. as      well as Flomax 0.4 mg once daily.   DISCHARGE LABORATORY AND VITALS:  Temperature 98.2, pulse 67,  respirations 16, blood pressure 122/59, and O2 saturation 97% on room  air.  White blood cell count of 4.9, hemoglobin of 11.6, hematocrit of  34.3, and MCV of 100.3.  Sodium of 134, potassium of 4.4, chloride of  99, bicarb of 29, glucose of 98, creatinine of 1.34, and BUN of 22.      Pershing Cox, MD  Electronically Signed      C. Milta Deiters, M.D.  Electronically Signed    RS/MEDQ  D:  02/15/2008  T:  02/16/2008  Job:  [id number]

## 2010-11-05 NOTE — H&P (Signed)
[facility]  Patient:    Zachary [name], Zachary [name] Visit Number: [id number] MRN: [id number]          Service Type: PMG Location: [facility] Attending Physician:  Nicholaus Bloom Dictated by:   Nicholaus Bloom, M.D. Adm. Date:  02/13/2001                           History and Physical  INCOMPLETE  FOLLOW-UP EVALUATION:  [name] comes in for follow-up evaluation of his Dictated by:   Nicholaus Bloom, M.D. Attending Physician:  Nicholaus Bloom DD:  02/14/01 TD:  02/14/01 Job: [id number]
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
## 2010-11-05 NOTE — H&P (Signed)
[facility]  Patient:    Zachary [name], Zachary [name] Visit Number: [id number] MRN: [id number]          Service Type: PMG Location: [facility] Attending Physician:  Nicholaus Bloom Dictated by:   Nicholaus Bloom, M.D. Adm. Date:  02/13/2001   CC:         C. Milta Deiters, M.D.   History and Physical  FOLLOW-UP EVALUATION  Mr. Yanagi comes in for follow-up evaluation of his intercostal neuropathy. The patient has noted that the methadone has helped to reduce his discomfort and he seems to be tolerating the 2.5 mg four times a day with only some minor dizziness which seems to be improving the longer he takes his medication.  He is interested in increasing the dose.  He notes that his pain is made worse by activities where he has to turn his body or touching the area involved.  PHYSICAL EXAMINATION:  VITAL SIGNS:  Blood pressure 155/87, heart rate is 75, respiratory rate is 18, O2 saturations 97%, pain level is 7/10.  NEUROLOGIC:  He has altered temperate perception in the T7-8 distribution over the right side of his chest and abdominal wall.  IMPRESSION: 1. Intercostal neuropathy, status post rib fractures. 2. Other medical problems per Dr. Milta Deiters.  DISPOSITION: 1. Increase methadone to 5 mg one p.o. q.8h., #90, with no refill. 2. Follow up with me in four weeks.  The patient is concerned that he may not    be able to follow up with me over at [facility], but he was    advised that he could follow up here at [facility] if he is    unable to get over there. Dictated by:   Nicholaus Bloom, M.D. Attending Physician:  Nicholaus Bloom DD:  02/14/01 TD:  02/14/01 Job: [id number]

## 2010-11-05 NOTE — Discharge Summary (Signed)
[facility]  Patient:    Zachary [name], Zachary [name] Visit Number: [id number] MRN: [id number]          Service Type: MED Location: [phone] Attending Physician:  Axel Filler Dictated by:   Thressa Sheller, M.D. Admit Date:  10/15/2001 Discharge Date: 10/17/2001   CC:         [facility]   Discharge Summary  DISCHARGE DIAGNOSES:  1. Left sided upper extremity and lower extremity weakness and numbness,     with negative magnetic resonance imaging MRA of the brain.  2. Hyponatremia with sodium of 129 on presentation, probably secondary to     hydrochlorothiazide.  3. Chest pain with no electrocardiogram changes and negative enzymes.  4. Hypertension.  5. Depression.  6. History of alcoholism, sober times two years.  7. History of vertigo and ataxia with normal magnetic resonance imaging MRA     in November 2002.  8. Gastroesophageal reflux disease.  9. Irritable bowel syndrome, diverticulosis on colonoscopy May 1999. 10. History of rib fracture with chronic neuralgia.  DISCHARGE MEDICATIONS:  1. Cardura 8 mg q.d.  2. Aspirin 325 mg q.d.  3. Librium 40 mg q.d.  4. Trazodone 150 mg q.h.s.  5. Darvocet-N 100 q.8h. p.r.n.  6. Methadone 5 mg q.i.d.  7. Zantac p.r.n.  8. Mavik 1 mg q.d.  DISPOSITION:  To home, will follow up with Dr. Stann Mainland on Nov 14, 2001 at previously scheduled appointment.  PROCEDURES:  Magnetic resonance imaging MRA of head shows slight progression nonspecific white matter type changes and mild intracranial arteriosclerotic type changes but no acute disease.  Carotid Dopplers on the right show mild diffuse heterogeneous plaque, CCA origins of internal carotid artery and ECA;  on the left mild to moderate homogenous plaque bifurcation in origin of internal carotid artery with impression no evidence of significant internal carotid artery stenosis, vertebral artery flow is antegrade.  HISTORY  OF PRESENT ILLNESS:  The patient is a 77 year old white male who presents with two day history of left sided weakness and numbness noticed first upon awakening.  He also reported his head was not feeling right and that he had visual changes suggesting increased depth perception but denied any diplopia, also reported pressure type chest pain in the center of his chest with no changes on inspiration, lasting several hours before resolving spontaneously.  This was accompanied by shortness of breath, no vertigo or dizziness.  He did feel dysphagic but denied choking.  He felt somewhat better the next night but his symptoms recurred with relapsing weakness and numbness in the left side and occasional shooting pains.  His symptoms were worse the day before admission. The patient went to see Dr. Nicholaus Bloom for Methadone treatment at regularly scheduled appointments on the day of admission was sent to the emergency department from there after talking about his symptoms.  ALLERGIES:  No known drug allergies.  MEDICATIONS:  1. Cardura 8 mg q.d.  2. Librium 40 mg q.d.  3. Methadone 5 mg q.i.d.  4. Hydrochlorothiazide 25 mg q.d.  5. Trazodone 150 mg p.o. q.h.s.  6. Zantac p.r.n.  7. Darvocet-N 100 q.8h.  FAMILY HISTORY:  Positive for coronary artery disease and skin cancer.  SOCIAL HISTORY:  No tobacco.  Abstinent from alcohol X2 years.  Former heavy smoker, heavy drinker, is retired Hotel manager. He lives alone in an apartment.  PHYSICAL EXAMINATION: VITAL SIGNS Temperature 97.9, blood pressure 142/77, pulse 62, respirations 16, oxygen  saturation 98% on room air.  GENERAL:  Well-nourished elderly male in no acute distress but appears anxious.  HEENT:  Pupils are equal, round and reactive to light, extraocular movements intact.  Funduscopic examination is within normal limits.  NECK: Supple without bruits or jugular venous distension.  LUNGS:  Clear to auscultation  bilaterally.  CARDIOVASCULAR:  Regular rate and rhythm with no murmurs, rubs, or gallops.  ABDOMEN:  Soft, nontender, nondistended, normal active bowel sounds. No hepatosplenomegaly.  EXTREMITIES:  No clubbing, cyanosis, or edema.  SKIN:  Multiple plaques that appear psoriatic covering his arms and hands.  NEUROLOGICAL:  Cranial nerves II-XII intact except for a right sided mouth droop.  Deep tendon reflexes 2+ and symmetrical.  Toes are downgoing bilaterally.  Strength is 5/5 on right upper extremity and right lower extremity, 4+/5 grip on left, 4+/5 plantar flexion toes, otherwise within normal limits.  No visual field cuts.  Sensation is decreased to pinprick on the right upper extremity, lower extremity and face.  Decreased vibratory sensation on the left.  Proprioception inconsistent, deficits on the left, no pronator drift. The patient has an intention tremor bilaterally that is slow. His gait is wide based and ataxic with mild positive Romberg.  Electrocardiogram shows normal sinus rhythm, no acute changes.  Head CT scan shows no acute changes, positive for atrophy.  Chest x-ray shows borderline cardiomegaly, no acute disease.  LABORATORY DATA: on admission show white blood cell count 6.5, hemoglobin 14.1, platelet count 157K.  Sodium 129, potassium 3.6, chloride 96, cO2 26, BUN 16, creatinine 1.0, glucose 97, bilirubin 0.7, alkaline phosphatase 86, AST 28, ALT 21, protein 7.1, albumin 4.1.  Protime 13.7, INR 1.0. Urinalysis is negative.  HOSPITAL COURSE: #1 - WEAKNESS, NUMBNESS:  The patients story was concerning for acute cerebrovascular accident so after the head CT scan was negative for bleed an magnetic resonance imaging, MRA was ordered.  This turned out to show there were no acute changes.  The patients weakness and numbness had improved by the day of discharge slightly.  #2 - HYPONATREMIA:  The patient came in with a sodium of 129.  This was  probably due to  his hydrochlorothiazide.  The hydrochlorothiazide was stopped and his sodium recovered to 137 on the day of discharge.  #3 - CHEST PAIN:  The patient had three sets of cardiac enzymes which showed no evidence of infarction.  He also had an electrocardiogram upon admission and the day after admission which showed no changes as the patients story was not consistent with cardiac chest pain, his work-up was taken no further.  #4 - HYPERTENSION:  The patients blood pressure was mildly elevated during his hospitalization.  His hydrochlorothiazide had been stopped.  He was instead started on  [facility], an ACE inhibitor, metoprolol was considered but his heart rate tended to stay in the 50s and 60s and so it was thought that a beta blocker would make him even more bradycardic.  The [facility] can be titrated as an outpatient for better blood pressure control.  OTHER LABORATORY DATA:  Cholesterol 185, triglycerides 103, HDL 42, LDL 122, TSH 1.1. RPR negative.  Vitamin B12 281.  The patients LDL cholesterol is slightly elevated but as he only has one risk factor, that being hypertension, a statin drug was not started on him.  He should pursue diet and exercise therapy for his cholesterol and this should be rechecked in six to twelve months.  DISCHARGE LABORATORY DATA: Sodium 137, potassium 3.4, chloride 100, cO2  31, BUN 10, creatinine 1.0, glucose 91, calcium 9.2.  The patients potassium was repleted before he left the hospital. Dictated by:   Thressa Sheller, M.D. Attending Physician:  Axel Filler DD:  10/17/01 TD:  10/19/01 Job: [id number]

## 2010-11-05 NOTE — Assessment & Plan Note (Signed)
[facility] OFFICE NOTE   Zachary [name], SCHMALTZ                       MRN:          [id number]  DATE:07/17/2006                            DOB:          04-Dec-1929    REASON FOR CONSULTATION:  Abdominal pain.   [name] has returned complaining of abdominal pain. Over the last  year he has noted mid epigastric pain that is rather constant. It has  actually improved with eating. The pain is without radiation. He has  intermittent dysphagia to solids. History is notable for esophageal  stricture which was last dilated in June 2007. A hyperplastic gastric  polyp was also seen. He takes methadone and Darvon regularly for chronic  back pain. He was also on omeprazole.   CT scan of the abdomen on June 28, 2006 was unremarkable.   MEDICATIONS:  1. Paroxetine.  2. HCTZ.  3. Constulose.  4. Simvastatin.  5. Omeprazole.  6. Methadone.  7. Benazepril.  8. Darvon.  9. Baby aspirin.  10.Chlordiazepoxide.   ALLERGIES:  He has no allergies.   PHYSICAL EXAMINATION:  VITAL SIGNS:  Pulse 80, blood pressure 110/70,  weight 162.  SKIN:  He has psoriatic rashes over his upper extremity.  ABDOMEN:  There is mild mid epigastric tenderness without guarding or  rebound. There are no abdominal masses or organomegaly.  HEENT:  EOMI. PERRLA. Sclerae are anicteric. Conjunctivae are pink.  NECK:  Supple without thyromegaly, adenopathy or carotid bruits.  CHEST:  Clear to auscultation and percussion without adventitious  sounds.  CARDIAC:  Regular rhythm, normal S1, S2. There are no murmurs, gallops  or rubs.  EXTREMITIES:  Full range of motion. No cyanosis, clubbing or edema.  RECTAL:  Deferred.  The remainder of the exam is normal.   IMPRESSION:  Persistent mid epigastric pain. This could be ASA related.  The patient is taking Darvon three times a day. Omeprazole may also  cause abdominal pain.   RECOMMENDATIONS:  1. Upper endoscopy.  2. To consider holding Darvon or replacing it with Darvocet if no      findings are seen. Finally I would consider holding his omeprazole      if the above measures are unsuccessful.     Zachary [name]. Deatra Ina, MD,FACG  Electronically Signed    RDK/MedQ  DD: 07/17/2006  DT: 07/17/2006  Job #: [id number]   cc:   C. Milta Deiters, M.D.

## 2010-11-05 NOTE — H&P (Signed)
[facility]  Patient:    Zachary [name], Zachary [name]                       MRN: [id number] Adm. Date:  [id number] Attending:  Nicholaus Bloom                         History and Physical  FOLLOWUP EVALUATION:  The patient comes in for followup evaluation of his intercostal neuropathy.  We have tried him on Lidoderm patches which resulted in mild improvement but caused forgetfulness.  We have tried him with Neurontin but he developed incontinence of his urine, which is an infrequent side-effects of this, and he is off of this now.  He is back on his Darvocet-N and trazodone; he is also taking Cardura and ______ .  He complains of pain over the right side of his upper abdomen and thorax.  It is made worse by prolonged sitting and getting up or prolonged walking.  EXAMINATION:  VITAL SIGNS:  Blood pressure 150/83.  Heart rate is 66.  Respiratory rate is 22.  O2 saturation is 97%.  Pain level is 7/10.  NEUROLOGIC:  He exhibits altered pinprick perception in the T7-T8 distribution over the right abdominal wall.  IMPRESSION: 1. Intercostal neuropathy, status post rib fractures. 2. Other medical problems per Dr. Renato Shin.  DISPOSITION:  The patient has been intolerant of Lidoderm patches and developed side-effects to the Neurontin which are untenable.  I advised him that I would like to go ahead and place him on low-dose methadone to see if we can effect some improvement versus trying with intercostal nerve blocks.  He wishes to hold on the nerve block for the time-being.  I will go ahead and start him on methadone 5 mg 1/2 tablet q.6h., #60 with no refill.  He was made aware of the side-effects and given a pamphlet on this.  I plan to see him back in followup in four weeks.  If he is not improved, we will consider intercostal nerve blocks, but I advised him that the improvement related to this might be just short-lived, since these are not permanent  blocks. DD:  01/17/01 TD:  01/17/01 Job: [id number]

## 2010-11-05 NOTE — Assessment & Plan Note (Signed)
[place] OFFICE NOTE   NAME:Zachary [name], Zachary [name]                       MRN:          [id number]  DATE:08/31/2006                            DOB:          March 07, 1930    PROBLEM:  Abdomen and chest pain.   Zachary [name] has returned for a scheduled GI followup.  Upper endoscopy  demonstrated two mid esophageal ulcers and a distal esophageal  stricture.  He also had a gastric polyp that was consistent with a  benign fundic polyp.  On omeprazole twice a day, Zachary [name] reports  some improvement in his chest and upper abdominal pain.  Dysphagia is  gone.  His chief concern at this point is constipation.  He may have  four bowel movements a month, despite taking lactulose daily.  He is  taking methadone and Darvocet on a daily basis.   PHYSICAL EXAMINATION:  VITAL SIGNS:  Pulse 68, blood pressure 152/80,  weight is 160.   IMPRESSION:  1. Chest and abdominal pain secondary to esophageal ulcers.  2. Esophageal stricture, asymptomatic, following dilatation therapy.  3. Constipation related to narcotic use.   RECOMMENDATIONS:  1. Continue omeprazole.  2. I carefully instructed Zachary [name] to drink copious amounts of      fluid when he takes medicine.  3. Begin MiraLax 17 grams up to daily.     Sandy Salaam. Deatra Ina, MD,FACG  Electronically Signed    RDK/MedQ  DD: 08/31/2006  DT: 09/01/2006  Job #: [id number]   cc:   C. Milta Deiters, M.D.

## 2010-11-05 NOTE — H&P (Signed)
[facility]  Patient:    Zachary [name], Zachary [name]                       MRN: [id number] Adm. Date:  [id number] Attending:  Nicholaus Bloom CC:         C. Milta Deiters, M.D.   History and Physical  FOLLOWUP EVALUATION:  Zachary [name] comes in for followup evaluation of his intercostal neuropathy.  Since his last evaluation, the patient has noted that the Lidoderm patches resulted in mild improvement at best but were causing some problems with forgetfulness.  I explained to him this is a possibility but very unusual and I encouraged him to go ahead and stop using it.  When I had seen him previously, we had discussed the possibility of switching him over to a tricyclic from the tetracyclic versus Neurontin and because he is pretty well established on the trazodone, I advised him we needed to go ahead and try Neurontin.  He rates his pain at 8/10.  He notes that standing and getting up tend to exacerbate his pain.  PHYSICAL EXAMINATION:  VITAL SIGNS:  Blood pressure 151/88.  Heart rate 66.  Respiratory rate is 18. O2 saturation is 99%.  Pain level is 8/10.  ABDOMEN:  He exhibited attenuated pinprick in the T7-T8 distribution of the right abdominal wall.  He exhibits a ventral hernia and prominence of his xiphoid process.  IMPRESSION: 1. 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 2. Other medical problems per Dr. Renato Shin.  DISPOSITION: 1. Neurontin 100 mg 1 p.o. q.p.m. x 7 days, then 1 p.o. b.i.d. x 7 days, then    1 p.o. t.i.d. x 7 days, then 2 p.o. t.i.d., #100 with 3 refills.  Follow    up with me in six weeks. 2. I advised him if he did not feel comfortable continuing with the Lidoderm    because of the dizzy/forgetfulness, to go ahead and stop this. 3. He was encouraged to call if he found any problems with taking the    Neurontin; I reviewed the side-effects with him in detail.  I advised  him    that we would need to taper him off of it if he needed to go off it. DD:  12/06/00 TD:  12/06/00 Job: [id number]

## 2010-11-29 ENCOUNTER — Other Ambulatory Visit: Payer: Self-pay | Admitting: *Deleted

## 2010-11-29 MED ORDER — METHADONE HCL 5 MG PO TABS: 5.0000 mg | ORAL_TABLET | Freq: Three times a day (TID) | ORAL | Status: DC

## 2010-12-29 ENCOUNTER — Other Ambulatory Visit: Payer: Self-pay | Admitting: Internal Medicine

## 2010-12-29 MED ORDER — CHLORDIAZEPOXIDE HCL 25 MG PO CAPS: 25.0000 mg | ORAL_CAPSULE | Freq: Two times a day (BID) | ORAL | Status: DC

## 2010-12-31 NOTE — Telephone Encounter (Signed)
Rx faxed into [facility] fax# [phone].Regenia Skeeter, Darlene Cassady7/13/201211:01 AM

## 2011-01-21 ENCOUNTER — Encounter: Payer: Medicare Other | Admitting: Internal Medicine

## 2011-01-24 ENCOUNTER — Other Ambulatory Visit: Payer: Self-pay | Admitting: *Deleted

## 2011-01-25 NOTE — Telephone Encounter (Signed)
This refill requires that I specify the amount of artificial tears to dispense.  I have no idea:  10 ml?  100 ml?  a 6-pack?  a rain-barrel?  Please find out what size container I should request.

## 2011-01-26 ENCOUNTER — Other Ambulatory Visit: Payer: Self-pay | Admitting: *Deleted

## 2011-01-26 MED ORDER — METHADONE HCL 5 MG PO TABS: 5.0000 mg | ORAL_TABLET | Freq: Three times a day (TID) | ORAL | Status: DC

## 2011-01-26 NOTE — Telephone Encounter (Signed)
[facility] fax# [phone]. [place]

## 2011-01-27 ENCOUNTER — Other Ambulatory Visit: Payer: Self-pay | Admitting: Internal Medicine

## 2011-01-27 MED ORDER — METHADONE HCL 5 MG PO TABS: 5.0000 mg | ORAL_TABLET | Freq: Three times a day (TID) | ORAL | Status: DC

## 2011-01-27 MED ORDER — ARTIFICIAL TEARS OP OINT: TOPICAL_OINTMENT | OPHTHALMIC | Status: DC

## 2011-01-27 NOTE — Progress Notes (Signed)
Methadone rxs x3 faxed and mailed to [facility].  Also called to be made awared of rxs being mailed to them.   Rxs to be scanned into chart.

## 2011-02-02 ENCOUNTER — Other Ambulatory Visit: Payer: Self-pay | Admitting: Dermatology

## 2011-03-08 ENCOUNTER — Other Ambulatory Visit: Payer: Self-pay | Admitting: *Deleted

## 2011-03-08 NOTE — Telephone Encounter (Signed)
Received fax from [facility].  Pt was c/o constipation (9/17) and was given prune juice which helped. Then c/o stomach pain. I called and talked to the nurse; she stated he had a BM and no longer c/o stomach pain. Can u refill Miralax rx?

## 2011-03-11 MED ORDER — POLYETHYLENE GLYCOL 3350 17 GM/SCOOP PO POWD: 17.0000 g | Freq: Every day | ORAL | Status: DC | PRN

## 2011-03-11 NOTE — Telephone Encounter (Signed)
[facility] made awared of Miralax rx.

## 2011-03-25 LAB — CBC
Hemoglobin: 13.2 g/dL (ref 13.0–17.0)
MCHC: 32.9 g/dL (ref 30.0–36.0)
RBC: 4.11 MIL/uL — ABNORMAL LOW (ref 4.22–5.81)
WBC: 8.4 10*3/uL (ref 4.0–10.5)

## 2011-03-25 LAB — DIFFERENTIAL
Basophils Relative: 0 % (ref 0–1)
Lymphocytes Relative: 18 % (ref 12–46)
Lymphs Abs: 1.5 10*3/uL (ref 0.7–4.0)
Monocytes Relative: 8 % (ref 3–12)
Neutro Abs: 6.1 10*3/uL (ref 1.7–7.7)
Neutrophils Relative %: 72 % (ref 43–77)

## 2011-03-25 LAB — POCT I-STAT, CHEM 8
BUN: 41 mg/dL — ABNORMAL HIGH (ref 6–23)
Chloride: 97 mEq/L (ref 96–112)
Glucose, Bld: 111 mg/dL — ABNORMAL HIGH (ref 70–99)
HCT: 41 % (ref 39.0–52.0)
Potassium: 3.9 mEq/L (ref 3.5–5.1)

## 2011-03-30 ENCOUNTER — Ambulatory Visit (INDEPENDENT_AMBULATORY_CARE_PROVIDER_SITE_OTHER): Payer: Medicare Other | Admitting: Internal Medicine

## 2011-03-30 ENCOUNTER — Encounter: Payer: Self-pay | Admitting: Internal Medicine

## 2011-03-30 DIAGNOSIS — Z23 Encounter for immunization: Secondary | ICD-10-CM

## 2011-03-30 DIAGNOSIS — F329 Major depressive disorder, single episode, unspecified: Secondary | ICD-10-CM

## 2011-03-30 DIAGNOSIS — Z0000 Encounter for general adult medical examination without abnormal findings: Secondary | ICD-10-CM

## 2011-03-30 MED ORDER — PAROXETINE HCL 20 MG PO TABS: 40.0000 mg | ORAL_TABLET | Freq: Every day | ORAL | Status: DC

## 2011-03-30 NOTE — Progress Notes (Signed)
  Subjective:    Patient ID: Zachary [name], male    DOB: 18-Jan-1930, 77 y.o.   MRN: [id number]  HPI 1. Follow up appointment. Has no concerns.   Review of Systems  Constitutional: Negative.   Eyes: Negative for visual disturbance.  Respiratory: Negative for chest tightness and shortness of breath.   Cardiovascular: Negative for chest pain, palpitations and leg swelling.  Gastrointestinal: Negative for nausea, vomiting, abdominal pain, constipation and abdominal distention.  Psychiatric/Behavioral: Positive for dysphoric mood and decreased concentration. Negative for suicidal ideas, hallucinations, behavioral problems, confusion, sleep disturbance, self-injury and agitation. The patient is not nervous/anxious and is not hyperactive.        Objective:   Physical Exam  Vitals: reviewed General: alert, well-developed, and cooperative to examination.  Head: normocephalic and atraumatic.  Eyes: vision grossly intact, pupils equal, pupils round, pupils reactive to light, no injection and anicteric.  Mouth: pharynx pink and moist, no erythema, and no exudates.  Neck: supple, full ROM, no thyromegaly, no JVD, and no carotid bruits.  Lungs: normal respiratory effort, no accessory muscle use, normal breath sounds, no crackles, and no wheezes. Heart: normal rate, regular rhythm, no murmur, no gallop, and no rub.  Abdomen: soft, non-tender, normal bowel sounds, no distention, no guarding, no rebound tenderness, no hepatomegaly, and no splenomegaly.  Msk: no joint swelling, no joint warmth, and no redness over joints.  Pulses: 2+ DP/PT pulses bilaterally Extremities: No cyanosis, clubbing, edema Neurologic: alert & oriented X3, cranial nerves II-XII intact, strength normal in all extremities, sensation intact to light touch, and gait normal.  Skin: turgor normal and no rashes.  Psych: Oriented X3, memory intact for recent and remote, normally interactive, good eye contact, not anxious appearing,  and not depressed appearing.         Assessment & Plan:  1. Skin scalp lesions (cancer?) -.been seen by a dermatologist and lesion were removed. -will requests consult note  2.Helath maintenance -flu shot -Tdap -diet, exercise  3. Depression/anxiety. Although feels "OK," reports depressed mood. -Will increase Paxil to 40 mg PO daily -instructed to call 911 and/or go to ED if SI/HI or mania -May benefit from seeing a psychologist on an ogoing basis --patient feels to be socially isolated and would benefit from speaking to someone.

## 2011-04-04 ENCOUNTER — Emergency Department (HOSPITAL_COMMUNITY)
Admission: EM | Admit: 2011-04-04 | Discharge: 2011-04-04 | Disposition: A | Discharge status: Home / Self Care | Payer: Medicare Other | Attending: Emergency Medicine | Admitting: Emergency Medicine

## 2011-04-04 ENCOUNTER — Emergency Department (HOSPITAL_COMMUNITY): Payer: Medicare Other

## 2011-04-04 ENCOUNTER — Encounter: Payer: Self-pay | Admitting: Internal Medicine

## 2011-04-04 DIAGNOSIS — F329 Major depressive disorder, single episode, unspecified: Secondary | ICD-10-CM | POA: Insufficient documentation

## 2011-04-04 DIAGNOSIS — S8000XA Contusion of unspecified knee, initial encounter: Secondary | ICD-10-CM | POA: Insufficient documentation

## 2011-04-04 DIAGNOSIS — E781 Pure hyperglyceridemia: Secondary | ICD-10-CM | POA: Insufficient documentation

## 2011-04-04 DIAGNOSIS — Z7982 Long term (current) use of aspirin: Secondary | ICD-10-CM | POA: Insufficient documentation

## 2011-04-04 DIAGNOSIS — Z85828 Personal history of other malignant neoplasm of skin: Secondary | ICD-10-CM | POA: Insufficient documentation

## 2011-04-04 DIAGNOSIS — Y92009 Unspecified place in unspecified non-institutional (private) residence as the place of occurrence of the external cause: Secondary | ICD-10-CM | POA: Insufficient documentation

## 2011-04-04 DIAGNOSIS — Y998 Other external cause status: Secondary | ICD-10-CM | POA: Insufficient documentation

## 2011-04-04 DIAGNOSIS — F411 Generalized anxiety disorder: Secondary | ICD-10-CM | POA: Insufficient documentation

## 2011-04-04 DIAGNOSIS — I10 Essential (primary) hypertension: Secondary | ICD-10-CM | POA: Insufficient documentation

## 2011-04-04 DIAGNOSIS — F3289 Other specified depressive episodes: Secondary | ICD-10-CM | POA: Insufficient documentation

## 2011-04-04 DIAGNOSIS — N400 Enlarged prostate without lower urinary tract symptoms: Secondary | ICD-10-CM | POA: Insufficient documentation

## 2011-04-04 DIAGNOSIS — Z79899 Other long term (current) drug therapy: Secondary | ICD-10-CM | POA: Insufficient documentation

## 2011-04-04 DIAGNOSIS — W06XXXA Fall from bed, initial encounter: Secondary | ICD-10-CM | POA: Insufficient documentation

## 2011-04-04 DIAGNOSIS — K219 Gastro-esophageal reflux disease without esophagitis: Secondary | ICD-10-CM | POA: Insufficient documentation

## 2011-04-04 LAB — CREATININE, SERUM: GFR calc Af Amer: 58 — ABNORMAL LOW

## 2011-04-26 ENCOUNTER — Other Ambulatory Visit: Payer: Self-pay | Admitting: *Deleted

## 2011-04-26 DIAGNOSIS — G8921 Chronic pain due to trauma: Secondary | ICD-10-CM

## 2011-04-26 NOTE — Telephone Encounter (Signed)
Last filled 10/9 Need hard Rx

## 2011-04-27 MED ORDER — METHADONE HCL 5 MG PO TABS: 5.0000 mg | ORAL_TABLET | Freq: Three times a day (TID) | ORAL | Status: DC

## 2011-04-27 NOTE — Telephone Encounter (Signed)
Rx faxed and mailed

## 2011-05-06 ENCOUNTER — Encounter: Payer: Medicare Other | Admitting: Internal Medicine

## 2011-05-24 ENCOUNTER — Ambulatory Visit: Payer: Medicare Other | Admitting: Internal Medicine

## 2011-05-24 ENCOUNTER — Telehealth: Payer: Self-pay | Admitting: *Deleted

## 2011-05-24 NOTE — Telephone Encounter (Signed)
[facility] called at approx. 12:15 P today but cannot arrange transportation for pt in time to be seen here in the clinic at 2: 45 P today so appt made for pt to be seen by Dr. Posey Pronto on 05/25/2011 at 8:45A. Pt has historically refused to be seen by anyone except Dr. Stann Mainland so this RN requested caregiver explain to pt that Dr. Stann Mainland will not be here tomorrow but physician here can see all his chart and evaluate his URI. Caregiver also stated he would send any notes he could find about pt's head "sore", especially if he could find the dermatology notes. Transportation confirmed for tomorrow's appt by [employer] caregiver. Yvonna Alanis, RN, 05/24/2011, 1: 25P

## 2011-05-25 ENCOUNTER — Encounter: Payer: Self-pay | Admitting: Internal Medicine

## 2011-05-25 ENCOUNTER — Ambulatory Visit (INDEPENDENT_AMBULATORY_CARE_PROVIDER_SITE_OTHER): Payer: Medicare Other | Admitting: Internal Medicine

## 2011-05-25 DIAGNOSIS — I10 Essential (primary) hypertension: Secondary | ICD-10-CM

## 2011-05-25 DIAGNOSIS — G8929 Other chronic pain: Secondary | ICD-10-CM

## 2011-05-25 DIAGNOSIS — J069 Acute upper respiratory infection, unspecified: Secondary | ICD-10-CM

## 2011-05-25 DIAGNOSIS — K219 Gastro-esophageal reflux disease without esophagitis: Secondary | ICD-10-CM

## 2011-05-25 DIAGNOSIS — F329 Major depressive disorder, single episode, unspecified: Secondary | ICD-10-CM

## 2011-05-25 DIAGNOSIS — Z85828 Personal history of other malignant neoplasm of skin: Secondary | ICD-10-CM

## 2011-05-25 LAB — CBC
MCH: 32.4 pg (ref 26.0–34.0)
MCHC: 33.6 g/dL (ref 30.0–36.0)
MCV: 96.6 fL (ref 78.0–100.0)
Platelets: 174 10*3/uL (ref 150–400)
RBC: 4.13 MIL/uL — ABNORMAL LOW (ref 4.22–5.81)

## 2011-05-25 MED ORDER — OMEPRAZOLE 20 MG PO CPDR: 20.0000 mg | DELAYED_RELEASE_CAPSULE | Freq: Every day | ORAL | Status: DC

## 2011-05-25 NOTE — Assessment & Plan Note (Signed)
Lab Results  Component Value Date   NA 137 08/05/2010   K 4.2 08/05/2010   CL 98 08/05/2010   CO2 28 08/05/2010   BUN 23 08/05/2010   CREATININE 1.16 08/05/2010   CREATININE 1.23 05/21/2010    BP Readings from Last 3 Encounters:  05/25/11 101/64  03/30/11 148/67  10/29/10 146/67    Assessment: Hypertension control:  controlled  Progress toward goals:  at goal Barriers to meeting goals:  no barriers identified  Plan: Hypertension treatment:  continue current medications. I will check a BMP today along with CBC.

## 2011-05-25 NOTE — Assessment & Plan Note (Signed)
As described in history of present illness and per physical exam, it seems to be likely from viral upper respiratory infection which is resolving. He does not seem to have any secondary bacterial infection right now-though I would check a CBC today. I discussed this with him and he verbalized understanding.

## 2011-05-25 NOTE — Assessment & Plan Note (Signed)
Continue current medication regimen. He seems to be at his baseline.

## 2011-05-25 NOTE — Patient Instructions (Signed)
Please make followup appointment in 3-4 months or as needed. I will update the medication list today. You most likely have a viral infection of your upper respiratory tract for which he would be okay to wait and watch. If your cough or breathing gets worse give [place] a call to make an early appointment to get reevaluated. I will give you call if any of the blood tests are abnormal.

## 2011-05-25 NOTE — Assessment & Plan Note (Addendum)
The big lesion on the left scalp is likely from his skin cancer- for which she is followed by Dr. Denna Haggard- with whom he will make an appointment soon. There is nothing much that can be done as per patient- that is what he was told by his dermatologist. Lesion doesn't seem to be infected.

## 2011-05-25 NOTE — Progress Notes (Signed)
  Subjective:    Patient ID: Zachary [name], male    DOB: Feb 17, 1930, 77 y.o.   MRN: [id number]  HPI Zachary [name] is a pleasant 77 year old man with past with history of anxiety/depression, hypertension, chronic pain, skin cancer who comes the clinic for a followup visit and assessment of cough and cold. He says that he had cough with yellowish sputum production for past 2 weeks which is getting better now. He says that he has intermittent fever few times everyday- but hasn't taken temperature. He feels congested in the chest but denies any sore throat or chest pain or shortness of breath. He lives at [facility]- and has a 1 year roommate there.  Overall he says that his attitude towards life is negative.  He is well known to Dr. Stann Mainland for last 30 years- wall so came by and talked with him for a while. Dr. Stann Mainland mentions that he has been depressed for a long time now.  He does have left-sided scalp lesion which is big and irregular- has history of skin cancer likely basal cell carcinoma- be followed by Dr. Denna Haggard- dermatologist- with whom he would make an appointment soon for the lesion. It had been scraped off by him in the past and has been told that there is nothing else which can be done. He has similar lesions- but small in size on his bilateral upper extremities.  He did have had a fall on 04/04/2011 when he was evaluated in ER was x-rays of his knee, hip and spine and was not found to have any fractures- but left knee x-ray showed likely old healed fibular fracture- where he is having tenderness to palpation. Though he is able to walk with minimal pain.  He denies any nausea vomiting, abdominal pain, diarrhea.  Review of Systems    as per history of present illness, all other systems reviewed and negative. Objective:   Physical Exam  Vitals: Reviewed and stable. General: NAD HEENT: PERRL, EOMI, no scleral icterus. Mild hyperemia of nasopharyngeal  mucosa. Cardiac: S1, S2, RRR, no rubs, murmurs or gallops Pulm: clear to auscultation bilaterally, moving normal volumes of air Abd: soft, nontender, nondistended, BS present Ext: Left knee tenderness to palpation medially. Right knee no abnormalities. No swelling of joints present. Neuro: alert and oriented X3, cranial nerves II-XII grossly intact. Skin: Chronic Verrucous lesions on bilateral upper extremities. Also big left scalp lesion present - yellowish in color without any signs of infection.      Assessment & Plan:

## 2011-05-25 NOTE — Assessment & Plan Note (Signed)
Continue current regimen with methadone and tramadol. I called [facility] to clarify his medication regimen as the print out which he brought did not contain methadone. They faxed the documents again and apparently on page was missing. I will scan the faxed documents.

## 2011-05-26 LAB — BASIC METABOLIC PANEL WITHOUT GFR
BUN: 29 mg/dL — ABNORMAL HIGH (ref 6–23)
CO2: 29 meq/L (ref 19–32)
Calcium: 9 mg/dL (ref 8.4–10.5)
Chloride: 99 meq/L (ref 96–112)
Creat: 1.84 mg/dL — ABNORMAL HIGH (ref 0.50–1.35)
GFR, Est African American: 39 mL/min — ABNORMAL LOW
GFR, Est Non African American: 34 mL/min — ABNORMAL LOW
Glucose, Bld: 98 mg/dL (ref 70–99)
Potassium: 3.6 meq/L (ref 3.5–5.3)
Sodium: 137 meq/L (ref 135–145)

## 2011-05-30 ENCOUNTER — Other Ambulatory Visit: Payer: Self-pay | Admitting: *Deleted

## 2011-05-30 MED ORDER — METHADONE HCL 5 MG PO TABS: 5.0000 mg | ORAL_TABLET | Freq: Three times a day (TID) | ORAL | Status: DC

## 2011-05-30 MED ORDER — TAB-A-VITE PO TABS: 1.0000 | ORAL_TABLET | Freq: Every day | ORAL | Status: DC

## 2011-05-30 MED ORDER — PROMOD PO POWD: 1.0000 | Freq: Three times a day (TID) | ORAL | Status: DC

## 2011-05-30 MED ORDER — TAMSULOSIN HCL 0.4 MG PO CAPS: 0.4000 mg | ORAL_CAPSULE | Freq: Every day | ORAL | Status: AC

## 2011-05-30 NOTE — Telephone Encounter (Signed)
Pt needs a refill on Promod(Protein Powder)  Mix 1 scoop with applesauce 3 times daily.  Needs Methadone HCL refill as well. Sander Nephew, RN 05/30/2011 11:57 AM

## 2011-06-02 ENCOUNTER — Other Ambulatory Visit: Payer: Self-pay | Admitting: *Deleted

## 2011-06-02 MED ORDER — TRAMADOL HCL 50 MG PO TABS: 50.0000 mg | ORAL_TABLET | Freq: Four times a day (QID) | ORAL | Status: DC | PRN

## 2011-06-03 ENCOUNTER — Encounter: Payer: Self-pay | Admitting: Internal Medicine

## 2011-06-03 ENCOUNTER — Ambulatory Visit (INDEPENDENT_AMBULATORY_CARE_PROVIDER_SITE_OTHER): Payer: Medicare Other | Admitting: Internal Medicine

## 2011-06-03 ENCOUNTER — Telehealth: Payer: Self-pay | Admitting: *Deleted

## 2011-06-03 DIAGNOSIS — K5903 Drug induced constipation: Secondary | ICD-10-CM

## 2011-06-03 DIAGNOSIS — F329 Major depressive disorder, single episode, unspecified: Secondary | ICD-10-CM

## 2011-06-03 DIAGNOSIS — J069 Acute upper respiratory infection, unspecified: Secondary | ICD-10-CM

## 2011-06-03 DIAGNOSIS — K5900 Constipation, unspecified: Secondary | ICD-10-CM

## 2011-06-03 HISTORY — DX: Drug induced constipation: K59.03

## 2011-06-03 NOTE — Telephone Encounter (Signed)
Nurse calls to say pt is c/o of cough, pain in L side when coughs and decreased breath sounds in L side, denies fevers, request appt, 1345 today w/ dr Obie Dredge

## 2011-06-03 NOTE — Progress Notes (Signed)
Subjective:   Patient ID: Zachary [name] male   DOB: [birth year] 77 y.o.   MRN: [id number]  HPI: Zachary [name] is a 77 y.o. man who presents to clinic today with several complaints.     He states that he has had pain in the LLQ of his abdomen for the last few weeks.  The pain is a crampy pain that is relieved with bowel movements.  He states that for the last few weeks he has only had 1 BM per week.  He is currently living at a SNF and they had stopped his miralax that he was taking previously because he was having loose stools.  He states that his last BM was yesterday and was soft.  Previous to that was a week before and was hard pellets.   He states that he has had a cough, congestions, and sore throat for the last week as well.  At the SNF there are several people with the same symptoms.  He states that his sputum is greenish brown.  He also had one of his pills stuck in his sputum one time.    He states that during the day he is very sleepy after he takes his AM pills.  He states that it has been since they started giving him his Paxil at 10 am.  He has noticed that he is also having problems sleeping at night, with lying awake in bed and being unable to sleep.   Past Medical History  Diagnosis Date  . Anxiety   . Depression   . GERD (gastroesophageal reflux disease)   . Diverticulitis of colon   . Hypertension   . Chronic pain   . Alcoholism   . Weight loss   . IBS (irritable bowel syndrome)   . BPH (benign prostatic hypertrophy)   . Skin cancer    Current Outpatient Prescriptions  Medication Sig Dispense Refill  . amLODipine (NORVASC) 5 MG tablet Take 5 mg by mouth daily.        Marland Kitchen artificial tears (LACRILUBE) OINT ophthalmic ointment Use as directed in left eye at bedtime  3.5 g  6  . aspirin 325 MG EC tablet Take 325 mg by mouth daily.        . chlordiazePOXIDE (LIBRIUM) 25 MG capsule Take 1 capsule (25 mg total) by mouth 2 (two) times daily.  60 capsule  4  . furosemide  (LASIX) 20 MG tablet Take 20 mg by mouth daily.        . methadone (DOLOPHINE) 5 MG tablet Take 1 tablet (5 mg total) by mouth every 8 (eight) hours.  90 tablet  0  . methadone (DOLOPHINE) 5 MG tablet Take 1 tablet (5 mg total) by mouth every 8 (eight) hours.  90 tablet  0  . methadone (DOLOPHINE) 5 MG tablet Take 1 tablet (5 mg total) by mouth 3 (three) times daily.  90 tablet  0  . Multiple Vitamin (TAB-A-VITE) TABS Take 1 tablet by mouth daily.  30 tablet  11  . mupirocin (BACTROBAN) 2 % ointment       . omeprazole (PRILOSEC) 20 MG capsule Take 1 capsule (20 mg total) by mouth daily.  30 capsule  6  . PARoxetine (PAXIL) 20 MG tablet Take 2 tablets (40 mg total) by mouth daily.  31 tablet  11  . polyethylene glycol powder (MIRALAX) powder Take 17 g by mouth daily as needed. Use as directed.  255 g  0  . protein  supplement (PROMOD) POWD Take 5 g by mouth 3 (three) times daily with meals.  454 g  0  . senna-docusate (PERICOLACE) 8.6-50 MG per tablet Take 4 tablets by mouth at bedtime.        . simvastatin (ZOCOR) 20 MG tablet Take 20 mg by mouth at bedtime.        . Tamsulosin HCl (FLOMAX) 0.4 MG CAPS Take 1 capsule (0.4 mg total) by mouth daily.  30 capsule  11  . tobramycin-dexamethasone (TOBRADEX) ophthalmic solution       . traMADol (ULTRAM) 50 MG tablet Take 1 tablet (50 mg total) by mouth every 6 (six) hours as needed. For pain.  30 tablet  6  . triamcinolone (KENALOG) 0.025 % cream        No family history on file. History   Social History  . Marital Status: Widowed    Spouse Name: N/A    Number of Children: N/A  . Years of Education: N/A   Social History Main Topics  . Smoking status: Former Smoker    Quit date: 06/20/1978  . Smokeless tobacco: None  . Alcohol Use: No  . Drug Use: No  . Sexually Active: None   Other Topics Concern  . None   Social History Narrative   Zachary [name] has been divorced for decades. He has 2 adult children probably in [place] with no contact.   Had a sig other who died years.  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  Maintains some contact with his sister and some nephews, but seems to have a lonely existence.  He lives in an assisting living facility in [place].  History has included smoking and periods of drinking (details of this are unclear)   Review of Systems: Negative except as noted in the HPI.  Objective:  Physical Exam: Filed Vitals:   06/03/11 1411  BP: 114/71  Pulse: 83  Temp: 97.6 F (36.4 C)  TempSrc: Oral  Height: 5' 6.5" (1.689 m)  Weight: 187 lb 9.6 oz (85.095 kg)   Constitutional: Vital signs reviewed.  Patient is a well-developed and well-nourished elderly man in no acute distress and cooperative with exam. Alert and oriented x3.  Head: Normocephalic and atraumatic,  Ear: TM normal bilaterally Mouth: no erythema or exudates, MMM Eyes: PERRL, EOMI, conjunctivae normal, No scleral icterus.  Nose: Turbinate erythema with congestion noted.   Neck: Supple, Trachea midline normal ROM, No JVD, mass, thyromegaly, or carotid bruit present.  Cardiovascular: RRR, S1 normal, S2 normal, no MRG, pulses symmetric and intact bilaterally Pulmonary/Chest: cough with deep inspiration but CTAB, no wheezes, rales, or rhonchi Abdominal: Soft. Mild tenderness in the LLQ to palpation. non-distended, bowel sounds are sluggish, no masses, organomegaly, or guarding present.  GU: no CVA tenderness Musculoskeletal: No joint deformities, erythema, or stiffness, ROM full and no nontender Hematology: no cervical, inginal, or axillary adenopathy.  Neurological: A&O x3, Strength is normal and symmetric bilaterally, cranial nerve II-XII are grossly intact, no focal motor deficit, sensory intact to light touch bilaterally.  Skin: Warm, dry and intact. No rash, cyanosis, or clubbing.  Psychiatric: Normal mood and affect. speech and behavior is normal.  Judgment and thought content normal. Cognition and memory are normal.   Assessment & Plan:

## 2011-06-03 NOTE — Patient Instructions (Addendum)
1.  You have a viral URI.  No warning signs for pneumonia.  Treat symptomatically with decongestants and pain medication.  2.  Switch the Paxil to bedtime instead of 10 am.  3.  Restart the Miralax daily until you are having 1 good soft bowel movement daily.  4.  Keep your follow up appointment with Dr. Stann Mainland on 12/21 at 11:15 am.

## 2011-06-06 ENCOUNTER — Other Ambulatory Visit: Payer: Self-pay | Admitting: *Deleted

## 2011-06-07 MED ORDER — ENSURE IMMUNE HEALTH PO LIQD: 237.0000 mL | Freq: Three times a day (TID) | ORAL | Status: AC

## 2011-06-07 MED ORDER — SENNOSIDES-DOCUSATE SODIUM 8.6-50 MG PO TABS: 4.0000 | ORAL_TABLET | Freq: Every day | ORAL | Status: DC

## 2011-06-10 ENCOUNTER — Ambulatory Visit (INDEPENDENT_AMBULATORY_CARE_PROVIDER_SITE_OTHER): Payer: Medicare Other | Admitting: Internal Medicine

## 2011-06-10 ENCOUNTER — Encounter: Payer: Self-pay | Admitting: Internal Medicine

## 2011-06-10 DIAGNOSIS — F329 Major depressive disorder, single episode, unspecified: Secondary | ICD-10-CM

## 2011-06-10 DIAGNOSIS — Z85828 Personal history of other malignant neoplasm of skin: Secondary | ICD-10-CM

## 2011-06-10 DIAGNOSIS — I10 Essential (primary) hypertension: Secondary | ICD-10-CM

## 2011-06-10 LAB — BASIC METABOLIC PANEL
BUN: 29 mg/dL — ABNORMAL HIGH (ref 6–23)
Calcium: 9.3 mg/dL (ref 8.4–10.5)
Glucose, Bld: 88 mg/dL (ref 70–99)
Sodium: 142 mEq/L (ref 135–145)

## 2011-06-10 NOTE — Assessment & Plan Note (Signed)
This problem is chronic and at least partly due to a series of losses.  He lost his favorite sister in [place] a few years ago.  His sister in [place], whose garage apartment he used to occupy, is in declining health and there is much less contact.  His greatest loss was his original family in [place] -- 2 children who never contact him after a messy divorce.  And, more recently, he has lost his physical independence due to physical frailty and some erratic behavior that cost him his longtime apartment.  He has been perennially unable to accommodate to the nursing home and its staff.

## 2011-06-10 NOTE — Progress Notes (Signed)
30 man with situational depression and several medical problems.

## 2011-06-10 NOTE — Assessment & Plan Note (Signed)
Has a huge area of scalp with hypertrophic lesion.  This is longstanding and is followed by Dr. Denna Haggard.  He reports some pain esp when recumbant.  Will ascertain his next derm app't.

## 2011-06-10 NOTE — Assessment & Plan Note (Signed)
No CV sx.  BP is well-controlled.  Creatinine last week was way up to 1.84.  Will repeat and check UA and urine albumin.

## 2011-06-11 LAB — URINALYSIS, ROUTINE W REFLEX MICROSCOPIC
Bilirubin Urine: NEGATIVE
Glucose, UA: NEGATIVE mg/dL
Hgb urine dipstick: NEGATIVE
Ketones, ur: NEGATIVE mg/dL
Leukocytes, UA: NEGATIVE
Nitrite: NEGATIVE
Protein, ur: NEGATIVE mg/dL
Specific Gravity, Urine: 1.018 (ref 1.005–1.030)
Urobilinogen, UA: 0.2 mg/dL (ref 0.0–1.0)
pH: 6 (ref 5.0–8.0)

## 2011-06-11 LAB — MICROALBUMIN / CREATININE URINE RATIO
Creatinine, Urine: 167.1 mg/dL
Microalb Creat Ratio: 61 mg/g — ABNORMAL HIGH (ref 0.0–30.0)
Microalb, Ur: 10.2 mg/dL — ABNORMAL HIGH (ref 0.00–1.89)

## 2011-06-16 ENCOUNTER — Other Ambulatory Visit: Payer: Self-pay | Admitting: *Deleted

## 2011-06-16 NOTE — Telephone Encounter (Signed)
Faxed request from [facility] ph# [phone] requesting refill on Simvastatin. Request sent to pcp for approval..Lashawna Poche Cassady12/27/20129:27 AM

## 2011-06-17 ENCOUNTER — Other Ambulatory Visit: Payer: Self-pay | Admitting: *Deleted

## 2011-06-17 MED ORDER — ASPIRIN 325 MG PO TBEC: 325.0000 mg | DELAYED_RELEASE_TABLET | Freq: Every day | ORAL | Status: DC

## 2011-06-17 MED ORDER — CHLORDIAZEPOXIDE HCL 25 MG PO CAPS: 25.0000 mg | ORAL_CAPSULE | Freq: Two times a day (BID) | ORAL | Status: DC

## 2011-06-17 MED ORDER — SIMVASTATIN 20 MG PO TABS: 20.0000 mg | ORAL_TABLET | Freq: Every day | ORAL | Status: DC

## 2011-06-17 NOTE — Telephone Encounter (Signed)
Call in chlorazepate/librium

## 2011-06-17 NOTE — Telephone Encounter (Signed)
Librium rx called to [facility].

## 2011-06-22 ENCOUNTER — Other Ambulatory Visit: Payer: Self-pay | Admitting: *Deleted

## 2011-06-23 MED ORDER — PROMOD PO POWD: 1.0000 | Freq: Three times a day (TID) | ORAL | Status: DC

## 2011-06-30 ENCOUNTER — Other Ambulatory Visit: Payer: Self-pay | Admitting: *Deleted

## 2011-07-01 MED ORDER — METHADONE HCL 5 MG PO TABS: 5.0000 mg | ORAL_TABLET | Freq: Three times a day (TID) | ORAL | Status: DC

## 2011-07-01 NOTE — Telephone Encounter (Signed)
I discussed with patient's PCP Dr. Stann Mainland, who requested that I refill the methadone which patient is on for chronic pain.  Prescription printed and signed - nurse to complete.

## 2011-07-01 NOTE — Telephone Encounter (Signed)
Prescription was faxed to pt's living Facility.  Sander Nephew, RN 07/01/2011.  3:09 PM

## 2011-07-04 ENCOUNTER — Other Ambulatory Visit: Payer: Self-pay | Admitting: *Deleted

## 2011-07-04 MED ORDER — POLYETHYLENE GLYCOL 3350 17 GM/SCOOP PO POWD: 17.0000 g | Freq: Every day | ORAL | Status: DC | PRN

## 2011-07-04 NOTE — Assessment & Plan Note (Signed)
His constipation is likely secondary to medication as well as his decreased activity.  We will have the nursing home restart his miralax and titrate to one BM every day or every other day.

## 2011-07-04 NOTE — Assessment & Plan Note (Signed)
He has viral URI symptoms.  We no history of COPD we will treat him symptomatically.  He was told that it may take him a few days to get over the cold.

## 2011-07-04 NOTE — Assessment & Plan Note (Signed)
His paxil is likely making him sleepy so we will continue it but change it to bed time to help him sleep.

## 2011-07-05 ENCOUNTER — Other Ambulatory Visit: Payer: Self-pay | Admitting: *Deleted

## 2011-07-05 NOTE — Telephone Encounter (Signed)
Received fax from [facility] - states usual dose is 1oz (2tbsp) which contains 24gm of protein; last refill was to take 5gm.

## 2011-07-06 ENCOUNTER — Telehealth: Payer: Self-pay | Admitting: *Deleted

## 2011-07-06 NOTE — Telephone Encounter (Signed)
Fax from [facility] stated that the usual dosage of the Promod is 1 oz (2 tbsp) contains 24 gm of protein.  There is no scoop in the can.  Please Clarify dosing.  Sander Nephew, RN 07/06/2011 4:40 PM.

## 2011-07-07 MED ORDER — PROMOD PO POWD: ORAL | Status: DC

## 2011-08-01 ENCOUNTER — Other Ambulatory Visit: Payer: Self-pay | Admitting: *Deleted

## 2011-08-01 MED ORDER — PROMOD PO POWD: ORAL | Status: DC

## 2011-08-01 MED ORDER — METHADONE HCL 5 MG PO TABS: 5.0000 mg | ORAL_TABLET | Freq: Three times a day (TID) | ORAL | Status: DC

## 2011-08-01 NOTE — Telephone Encounter (Signed)
Need to fax and mail to pharmacy methadone Rx Fax # [phone] Address : [facility]  [address], [id number]  Clarify address

## 2011-08-05 ENCOUNTER — Other Ambulatory Visit: Payer: Self-pay | Admitting: Internal Medicine

## 2011-08-05 MED ORDER — LORAZEPAM 1 MG PO TABS: 1.0000 mg | ORAL_TABLET | Freq: Three times a day (TID) | ORAL | Status: DC | PRN

## 2011-08-12 ENCOUNTER — Encounter: Payer: Medicare Other | Admitting: Internal Medicine

## 2011-08-15 ENCOUNTER — Telehealth: Payer: Self-pay | Admitting: *Deleted

## 2011-08-15 NOTE — Telephone Encounter (Signed)
Received form from [facility] that it will no longer pay for patient's chloridiazepoxide 25 mg caps (libruim).  Medication is no longer on the formulary.  Discussed with DrRogers and rx was changed to ativan 1mg   One tablet every 8 hours as needed for anxiety # 60.  I did not inform [facility] until today as they were requesting refill on the librium.  I spoke with them and faxed a copy of the rx to their facility in addition to faxing a copy to [facility] ph# [phone].

## 2011-08-29 ENCOUNTER — Other Ambulatory Visit: Payer: Self-pay | Admitting: Dermatology

## 2011-09-01 ENCOUNTER — Other Ambulatory Visit: Payer: Self-pay | Admitting: *Deleted

## 2011-09-01 DIAGNOSIS — G8929 Other chronic pain: Secondary | ICD-10-CM

## 2011-09-01 NOTE — Telephone Encounter (Signed)
Dr Stann Mainland can you write rx for March, April, and May. Fax rx to [facility] [phone]; also mail.

## 2011-09-02 MED ORDER — METHADONE HCL 5 MG PO TABS: 5.0000 mg | ORAL_TABLET | Freq: Three times a day (TID) | ORAL | Status: DC

## 2011-09-02 NOTE — Telephone Encounter (Signed)
Rx was faxed to both [facility] and [facility].  Hard copy was mailed to [facility].Regenia Skeeter, Zimal Weisensel Cassady3/15/201311:10 AM

## 2011-09-02 NOTE — Telephone Encounter (Signed)
I wrote methadone prescription as requested, which is for management of chronic pain; nurse to forward to pharmacy.  Will defer to patient's PCP for additional prescriptions.

## 2011-09-09 ENCOUNTER — Encounter: Payer: Self-pay | Admitting: Internal Medicine

## 2011-09-09 ENCOUNTER — Ambulatory Visit (INDEPENDENT_AMBULATORY_CARE_PROVIDER_SITE_OTHER): Payer: Medicare Other | Admitting: Internal Medicine

## 2011-09-09 VITALS — BP 186/90 | HR 92 | Temp 97.9°F | Wt 187.3 lb

## 2011-09-09 DIAGNOSIS — G8929 Other chronic pain: Secondary | ICD-10-CM

## 2011-09-09 MED ORDER — LORAZEPAM 1 MG PO TABS: 1.0000 mg | ORAL_TABLET | Freq: Two times a day (BID) | ORAL | Status: DC

## 2011-09-09 MED ORDER — AMLODIPINE BESYLATE 5 MG PO TABS: 10.0000 mg | ORAL_TABLET | Freq: Every day | ORAL | Status: DC

## 2011-09-09 MED ORDER — METHADONE HCL 5 MG PO TABS: 5.0000 mg | ORAL_TABLET | Freq: Two times a day (BID) | ORAL | Status: DC
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
## 2011-09-14 ENCOUNTER — Telehealth: Payer: Self-pay | Admitting: Licensed Clinical Social Worker

## 2011-09-14 NOTE — Telephone Encounter (Signed)
CSW recv'd referral for mental health resources from pt's most recent PCP visit.  Pt agreeable to mental health services at the time of his office visit.  CSW placed call to [facility] to speak with pt.  Pt sleeping.  CSW spoke with Olen Cordial, RN that works with pt.  Olen Cordial indicates pt is still agreeable to mental health services.  CSW referred to [employer] for [facility] [employer] eligibility.  [facility] will send RN to determine pt's eligibility and notify this CSW.  Called placed to [facility], notified pt's current nurse, Percell Locus of referral.  Left CSW contact information. Pt has also mentioned the desire to move from his current assisted living facility.  CSW will assist with this once pt is linked with a community mental health agency, supporting pt's independent community living is the priority.

## 2011-09-14 NOTE — Telephone Encounter (Signed)
Olen Cordial, RN at [facility] is scheduled to assess pt on 4/1 at 11:00 am. Olen Cordial to notify Mr. Walsingham as he is still sleep.

## 2011-09-28 ENCOUNTER — Other Ambulatory Visit: Payer: Self-pay | Admitting: Dermatology

## 2011-10-17 ENCOUNTER — Other Ambulatory Visit: Payer: Self-pay | Admitting: *Deleted

## 2011-10-17 ENCOUNTER — Telehealth: Payer: Self-pay | Admitting: *Deleted

## 2011-10-17 DIAGNOSIS — G8929 Other chronic pain: Secondary | ICD-10-CM

## 2011-10-17 MED ORDER — METHADONE HCL 5 MG PO TABS: 5.0000 mg | ORAL_TABLET | Freq: Two times a day (BID) | ORAL | Status: DC

## 2011-10-17 NOTE — Telephone Encounter (Signed)
Methadone rx faxed to [facility] [phone] this morning. Hard script to be mailed to [facility] in [place].   [facility] had called and made awared.

## 2011-11-01 ENCOUNTER — Ambulatory Visit (INDEPENDENT_AMBULATORY_CARE_PROVIDER_SITE_OTHER): Payer: Medicare Other | Admitting: Internal Medicine

## 2011-11-01 ENCOUNTER — Encounter: Payer: Self-pay | Admitting: Internal Medicine

## 2011-11-01 VITALS — BP 151/86 | HR 83 | Temp 97.9°F | Ht 66.5 in | Wt 185.1 lb

## 2011-11-01 DIAGNOSIS — G8929 Other chronic pain: Secondary | ICD-10-CM

## 2011-11-01 DIAGNOSIS — R109 Unspecified abdominal pain: Secondary | ICD-10-CM

## 2011-11-01 DIAGNOSIS — F329 Major depressive disorder, single episode, unspecified: Secondary | ICD-10-CM

## 2011-11-01 DIAGNOSIS — R42 Dizziness and giddiness: Secondary | ICD-10-CM

## 2011-11-01 DIAGNOSIS — R1310 Dysphagia, unspecified: Secondary | ICD-10-CM

## 2011-11-01 LAB — COMPREHENSIVE METABOLIC PANEL
BUN: 22 mg/dL (ref 6–23)
CO2: 28 mEq/L (ref 19–32)
Creat: 1.3 mg/dL (ref 0.50–1.35)
Glucose, Bld: 95 mg/dL (ref 70–99)
Total Bilirubin: 0.4 mg/dL (ref 0.3–1.2)

## 2011-11-01 LAB — TSH: TSH: 2.362 u[IU]/mL (ref 0.350–4.500)

## 2011-11-01 MED ORDER — MECLIZINE HCL 12.5 MG PO TABS: 12.5000 mg | ORAL_TABLET | Freq: Three times a day (TID) | ORAL | Status: AC | PRN

## 2011-11-01 MED ORDER — METHADONE HCL 5 MG PO TABS: 5.0000 mg | ORAL_TABLET | Freq: Two times a day (BID) | ORAL | Status: DC

## 2011-11-01 MED ORDER — ASPIRIN 81 MG PO TBEC: 81.0000 mg | DELAYED_RELEASE_TABLET | Freq: Every day | ORAL | Status: DC

## 2011-11-01 NOTE — Assessment & Plan Note (Addendum)
I believe the patient's dizziness is multifactorial.  Orthostatic vital signs negative today.  He has not suffered a syncopal event. He has some features that suggest vertigo (possibly BPPV); Mnire's is unlikely given lack of hearing impairment and remote episode of tinnitus.  Ongoing EtOH use is likely contributing/exacerbating his symptoms. There are no abnormalities on neuro exam to suggest a focal cns deficit contributing to his problems.  Will prescribe meclizine for relief of symptoms and refer him to PT for further evaluation and treatment.  Will check TSH and obtain a comprehensive metabolic panel today to assess for underlying thyroid or metabolic dysfunction that may be causing his dizziness. Patient is advised to RTC or go to the ER if he develops syncope, c/p,. Sob, doe, focal neurologic deficit, or other concerning symptom.

## 2011-11-01 NOTE — Assessment & Plan Note (Signed)
Patient states his mood is ok.  Denies suicidal/homicidal ideation.

## 2011-11-01 NOTE — Progress Notes (Signed)
Patient ID: Zachary [name], male   DOB: 1929-11-17, 78 y.o.   MRN: [id number] Subjective:     HPI: Patient is a 78 y/o M here today for c/o  dizziness, and abdominal pain.   Dizziness: He states he experiences dizziness at various times; not always postural, not associated with turing head.  States he can become dizzy while walking as well as when sitting.  He describes the dizziness as a lightheaded sensation as well as a sensation of the room spinning.  He has not passed out.  He admits to intermittent tinnitus but states this is infrequent.  He denies any hearing loss.  He states these symptoms have been present for many months.  He does not note any alleviating factors; he notes his symptoms are worse when he's in the dining room (this is a place he does not like to be).    Abdominal pain: he reports centrally located abdominal pain that seems worse after eating.  He reports the pain is not constant but occurs an average of 3-5 times per day.  He reports the pain seemed better after drinking Pepsi and burping.  He describes it as a burning pain.  He denies any cough.  He admits to nausea but denies emesis.  He has had this pain for the past year.  He admits to difficulties with intermittent loose stool.  His last bm occurred this am and was loose.  He denies chills but noted increased perspiration and "feeling hot" over the past few months.  He denies BRBPR or dark black stool.  Dysphagia: pt notes increased difficulty swallowing over the past 2-3 months.  He reports a sensation of food getting stuck in his throat.  He reports trouble swallowing pills and solid food but not fluids.  He reports persistent sensation of heartburn despite daily prilosec.  Has lost ~5lb unintentionally over the past 5 months.   Review of Systems Orthostatics negative. Constitutional: Negative for fever, chills, diaphoresis, activity change, appetite change, fatigue and unexpected weight change.  HENT: Negative for  hearing loss, congestion and neck stiffness.   Eyes: Negative for photophobia, pain and visual disturbance.  Respiratory: Negative for cough, chest tightness, shortness of breath and wheezing.   Cardiovascular: Negative for chest pain and palpitations.  Gastrointestinal: Negative for abdominal pain, blood in stool and anal bleeding.  Genitourinary: Negative for dysuria, hematuria and difficulty urinating.  Musculoskeletal: Negative for joint swelling.  Neurological: Negative for dizziness, syncope, speech difficulty, weakness, numbness and headaches. ]     Objective:   Physical Exam VItal signs reviewed and stable. GEN: No apparent distress.  Alert and oriented x 3.  Pleasant, conversant, and cooperative to exam. HEENT: head is autraumatic and normocephalic.  Neck is supple without palpable masses or lymphadenopathy.  No JVD or carotid bruits.  Vision intact.  EOMI.  PERRLA.  Sclerae anicteric.  Conjunctivae without pallor or injection. Mucous membranes are moist.  Oropharynx is without erythema, exudates, or other abnormal lesions.  Edentulous. RESP:  Lungs are clear to ascultation bilaterally with good air movement.  No wheezes, ronchi, or rubs. CARDIOVASCULAR: regular rate, normal rhythm.  Clear S1, S2, no murmurs, gallops, or rubs. ABDOMEN: soft,  non-distended.  distractable TTP with voluntary guarding while palpating using hands; TTP/guarding not present when palpating using stethoscope.  Bowels sounds present in all quadrants and normoactive.  No palpable masses. EXT: warm and dry.  Peripheral pulses equal, intact, and +2 globally.  No clubbing or cyanosis.  Trace edema  in bilateral lower extremities. SKIN: warm and dry with normal turgor.  No rashes or abnormal lesions observed. NEURO: CN II-XII grossly intact.  Muscle strength +5/5 in bilateral upper and lower extremities.  Sensation is grossly intact.  No focal deficit.     Assessment/Plan:

## 2011-11-01 NOTE — Assessment & Plan Note (Signed)
Patient is on chronic methadone.  Will refill today as he is due for monthly script.

## 2011-11-01 NOTE — Assessment & Plan Note (Addendum)
Will obtain barium swallow to evaluate for possible dysmotility.  He has a long-standing history of GERD that is only partially controlled with daily omeprazole; this raises the concern for obstructive pathology.  If the barium swallow is unrevealing, will refer him to gastroenterology for further evaluation, possibly with EGD, and treatment.  Discussed this plan with patient; he is in agreement.  He is advised to continue taking PPI daily.

## 2011-11-01 NOTE — Assessment & Plan Note (Signed)
Patient has distractible pain on palpation (pain with palpation using hands, no pain while palpating using stethoscope).  I believe his abdominal pain is the result of gaseous distention and possible IBS, given hx of constipation alternating with loose stool.  There may be a component of GERD +/- alcoholic gastritis contributing to his discomfort.  Patient is afebrile, hemodynamically stable, and there are no worrisome findings on exam to suggest an acute process.  As described above, will obtain CMET today (to evaluate liver function and electrolyes) and proceed with barium swallow.  Patient may ultimately be referred to GI for EGD to evaluate his dysphagia; GI may also be able to comment on cause(s) of patient's abdominal pain.

## 2011-11-01 NOTE — Patient Instructions (Signed)
Schedule a follow up appointment with Dr. Stann Mainland in 1 month. Meclizine is a new medicine to help with her dizziness. Use as directed We will refer you to physical therapy for further evaluation and treatment of your dizziness. We will schedule you for a barium swallow test to evaluate your difficulty swallowing. If this test is negative, we will need to refer you to a gastroenterologist (stomach and intestine specialist). I will call you if any of your blood work is abnormal  Keep taking your medications as directed. Call the clinic at [phone] with any concerns or questions.

## 2011-11-08 ENCOUNTER — Ambulatory Visit (HOSPITAL_COMMUNITY)
Admission: RE | Admit: 2011-11-08 | Discharge: 2011-11-08 | Disposition: A | Discharge status: Home / Self Care | Payer: Medicare Other | Source: Ambulatory Visit | Attending: Internal Medicine | Admitting: Internal Medicine

## 2011-11-08 ENCOUNTER — Telehealth: Payer: Self-pay | Admitting: *Deleted

## 2011-11-08 ENCOUNTER — Ambulatory Visit (HOSPITAL_COMMUNITY)
Admission: RE | Admit: 2011-11-08 | Discharge: 2011-11-08 | Disposition: A | Discharge status: Home / Self Care | Payer: Medicare Other | Source: Ambulatory Visit | Attending: Diagnostic Radiology | Admitting: Diagnostic Radiology

## 2011-11-08 ENCOUNTER — Other Ambulatory Visit (HOSPITAL_COMMUNITY): Payer: Self-pay | Admitting: Internal Medicine

## 2011-11-08 DIAGNOSIS — R1310 Dysphagia, unspecified: Secondary | ICD-10-CM | POA: Insufficient documentation

## 2011-11-08 NOTE — Telephone Encounter (Signed)
I am unable to discontinue the modified barium swallow order in Epic; I called radiology and they took a verbal order to make the change.

## 2011-11-08 NOTE — Telephone Encounter (Signed)
Zachary [name], radiology, states Mod Barium Swallow needs to be changed to Barium Swallow, according to Dr Jerelene Redden' notes. Barium Swallow is needed to check for dysmotility.  Test is scheduled for today. I also talked to [name] [phone]) in radiology who stated MD needs to put in new order for the [facility] Swallow and cancel the other one. Dr. Jerelene Redden is out of town; I will send this to the Attending.  Thanks

## 2011-11-25 ENCOUNTER — Ambulatory Visit (INDEPENDENT_AMBULATORY_CARE_PROVIDER_SITE_OTHER): Payer: Medicare Other | Admitting: Internal Medicine

## 2011-11-25 ENCOUNTER — Encounter: Payer: Self-pay | Admitting: Internal Medicine

## 2011-11-25 VITALS — BP 154/87 | HR 83 | Temp 98.4°F | Wt 186.6 lb

## 2011-11-25 DIAGNOSIS — R42 Dizziness and giddiness: Secondary | ICD-10-CM

## 2011-11-25 NOTE — Progress Notes (Signed)
60 man with endogenous and situational depression.  He lives in an assisted living and has been chronically unhappy there.  He is too frail to live independently although he nurtures hopes of resuming that status.  Has a disabled sister in town and some other distant relatives but no real social support from family.  Last visit with me he was having a crisis of disaffection with the staff but things seem to be better now.  Has scattered pains but nothing consistent.  Has intermittent dizzyness possibly related to problems with left eye.  Has app't in 2 weeks with his eye doctor. We have been reducing his sedatives and I believe this may be the right course. All labs OK this spring.  No labs needed today.

## 2012-01-06 ENCOUNTER — Other Ambulatory Visit: Payer: Self-pay | Admitting: *Deleted

## 2012-01-06 DIAGNOSIS — K219 Gastro-esophageal reflux disease without esophagitis: Secondary | ICD-10-CM

## 2012-01-06 MED ORDER — OMEPRAZOLE 20 MG PO CPDR: 20.0000 mg | DELAYED_RELEASE_CAPSULE | Freq: Every day | ORAL | Status: DC

## 2012-02-08 ENCOUNTER — Other Ambulatory Visit: Payer: Self-pay | Admitting: *Deleted

## 2012-02-08 DIAGNOSIS — G8929 Other chronic pain: Secondary | ICD-10-CM

## 2012-02-08 NOTE — Telephone Encounter (Signed)
Please print 3 scripts and we will mail them to pharm

## 2012-02-09 MED ORDER — METHADONE HCL 5 MG PO TABS: 5.0000 mg | ORAL_TABLET | Freq: Two times a day (BID) | ORAL | Status: DC

## 2012-02-17 ENCOUNTER — Ambulatory Visit (INDEPENDENT_AMBULATORY_CARE_PROVIDER_SITE_OTHER): Payer: Medicare Other | Admitting: Internal Medicine

## 2012-02-17 VITALS — BP 130/82 | HR 85 | Temp 98.2°F | Wt 186.1 lb

## 2012-02-17 DIAGNOSIS — G8929 Other chronic pain: Secondary | ICD-10-CM

## 2012-02-17 DIAGNOSIS — K219 Gastro-esophageal reflux disease without esophagitis: Secondary | ICD-10-CM

## 2012-02-17 DIAGNOSIS — R42 Dizziness and giddiness: Secondary | ICD-10-CM

## 2012-02-17 LAB — CBC WITH DIFFERENTIAL/PLATELET
Basophils Absolute: 0 10*3/uL (ref 0.0–0.1)
Basophils Relative: 0 % (ref 0–1)
Eosinophils Absolute: 0.1 10*3/uL (ref 0.0–0.7)
Eosinophils Relative: 1 % (ref 0–5)
HCT: 43.1 % (ref 39.0–52.0)
Hemoglobin: 14.6 g/dL (ref 13.0–17.0)
Lymphocytes Relative: 25 % (ref 12–46)
Lymphs Abs: 1.8 10*3/uL (ref 0.7–4.0)
MCH: 32.2 pg (ref 26.0–34.0)
MCHC: 33.9 g/dL (ref 30.0–36.0)
MCV: 94.9 fL (ref 78.0–100.0)
Monocytes Absolute: 0.8 10*3/uL (ref 0.1–1.0)
Monocytes Relative: 11 % (ref 3–12)
Neutro Abs: 4.4 10*3/uL (ref 1.7–7.7)
Neutrophils Relative %: 63 % (ref 43–77)
Platelets: 185 10*3/uL (ref 150–400)
RBC: 4.54 MIL/uL (ref 4.22–5.81)
RDW: 13.3 % (ref 11.5–15.5)
WBC: 7.1 10*3/uL (ref 4.0–10.5)

## 2012-02-17 NOTE — Progress Notes (Signed)
75 man with depression and several regional discomforts.  He is unhappily at an assisted living home and has chronic disaffection with the staff.  He has complained of dizzyness for over 10 years.  He has intermittent periods of irritable bowel symptoms.  His weight is stable and his labs have been unrevealing.Marland Kitchen  [facility]ends [facility]. Lungs clear. Cor regular w/o murmur. Abd is soft w/o masses or notable tenderness. No edema.  Will check CBC.   He is past screening and there is no new finding to prompt a diagnostic work up.

## 2012-02-27 ENCOUNTER — Other Ambulatory Visit: Payer: Self-pay | Admitting: Dermatology

## 2012-03-15 ENCOUNTER — Other Ambulatory Visit: Payer: Self-pay | Admitting: *Deleted

## 2012-03-15 DIAGNOSIS — G8929 Other chronic pain: Secondary | ICD-10-CM

## 2012-03-15 NOTE — Telephone Encounter (Signed)
Need urgently Need to fax hard copy

## 2012-03-15 NOTE — Telephone Encounter (Signed)
Pharmacy has Rx

## 2012-04-10 ENCOUNTER — Other Ambulatory Visit: Payer: Self-pay | Admitting: *Deleted

## 2012-04-10 DIAGNOSIS — G8929 Other chronic pain: Secondary | ICD-10-CM

## 2012-04-10 MED ORDER — LORAZEPAM 1 MG PO TABS: 1.0000 mg | ORAL_TABLET | Freq: Two times a day (BID) | ORAL | Status: DC

## 2012-04-10 NOTE — Telephone Encounter (Signed)
Call from pt stating that he is completely out of ativan. I explained to the patient that his pcp is in the process of retiring and he will be assigned to another MD.  In the interim, I have call in a rx with no refills for the ativan.  Will forward to Dr Stann Mainland to make him aware and will have pt make a f/u appt with new pcp.  I have not addressed the methadone prescriptions and will forward it back to Dr Stann Mainland.Regenia Skeeter, Darlene Cassady10/22/20133:37 PM

## 2012-04-10 NOTE — Telephone Encounter (Signed)
Pharmacy is filling last of 3 scripts for methadone today, please write 3 more nov, dec and jan and we will mail those to the pharmacy company, this is for the methadone

## 2012-04-10 NOTE — Telephone Encounter (Signed)
Error

## 2012-04-16 ENCOUNTER — Other Ambulatory Visit: Payer: Self-pay | Admitting: *Deleted

## 2012-04-16 MED ORDER — PROMOD PO POWD: ORAL | Status: DC

## 2012-04-20 ENCOUNTER — Other Ambulatory Visit: Payer: Self-pay | Admitting: Internal Medicine

## 2012-04-23 MED ORDER — METHADONE HCL 5 MG PO TABS: ORAL_TABLET | ORAL | Status: DC

## 2012-04-23 NOTE — Addendum Note (Signed)
Addended by: Milta Deiters on: 04/23/2012 12:21 PM   Modules accepted: Orders

## 2012-04-26 ENCOUNTER — Ambulatory Visit (INDEPENDENT_AMBULATORY_CARE_PROVIDER_SITE_OTHER): Payer: Medicare Other | Admitting: Internal Medicine

## 2012-04-26 ENCOUNTER — Encounter: Payer: Self-pay | Admitting: Internal Medicine

## 2012-04-26 VITALS — BP 120/72 | HR 92 | Temp 96.9°F | Ht 67.0 in | Wt 189.6 lb

## 2012-04-26 DIAGNOSIS — Z299 Encounter for prophylactic measures, unspecified: Secondary | ICD-10-CM

## 2012-04-26 DIAGNOSIS — G8929 Other chronic pain: Secondary | ICD-10-CM

## 2012-04-26 DIAGNOSIS — I10 Essential (primary) hypertension: Secondary | ICD-10-CM

## 2012-04-26 DIAGNOSIS — Z96649 Presence of unspecified artificial hip joint: Secondary | ICD-10-CM

## 2012-04-26 DIAGNOSIS — Z23 Encounter for immunization: Secondary | ICD-10-CM

## 2012-04-26 DIAGNOSIS — Z85828 Personal history of other malignant neoplasm of skin: Secondary | ICD-10-CM

## 2012-04-26 DIAGNOSIS — R42 Dizziness and giddiness: Secondary | ICD-10-CM

## 2012-04-26 DIAGNOSIS — F329 Major depressive disorder, single episode, unspecified: Secondary | ICD-10-CM

## 2012-04-26 NOTE — Assessment & Plan Note (Signed)
Continue methadone 5 bid. Patient asks for extra methadone pill. I discussed with Dr. Stann Mainland and then with patient about potential side effects of overdose and death ad so asked to take 1 extra tramadol if needed as he takes only 1 a day. He agreed to plan.

## 2012-04-26 NOTE — Assessment & Plan Note (Signed)
Mood appears stable. Continue paroxetine.

## 2012-04-26 NOTE — Patient Instructions (Signed)
Please make a followup appointment in 4-5 months or earlier if needed. If your hip pain does not get better- call clinic to make an early appointment. Keep taking all medications regularly. No change in medications today.

## 2012-04-26 NOTE — Assessment & Plan Note (Signed)
Lab Results  Component Value Date   NA 137 11/01/2011   K 4.8 11/01/2011   CL 99 11/01/2011   CO2 28 11/01/2011   BUN 22 11/01/2011   CREATININE 1.30 11/01/2011   CREATININE 1.23 05/21/2010    BP Readings from Last 3 Encounters:  04/26/12 120/72  02/17/12 130/82  11/25/11 154/87    Assessment: Hypertension control:  controlled  Progress toward goals:  at goal Barriers to meeting goals:  no barriers identified  Plan: Hypertension treatment:  continue current medications. Continue Norvasc and Lasix.

## 2012-04-26 NOTE — Assessment & Plan Note (Signed)
Still has intermittent dizziness. No more w/u for now.

## 2012-04-26 NOTE — Progress Notes (Signed)
  Subjective:    Patient ID: Zachary [name], male    DOB: Mar 16, 1930, 78 y.o.   MRN: [id number]  HPI patient is a pleasant 78 year old man with past history of hypertension, dizziness, skin cancer, hip replacement who comes the clinic for left hip pain. He complains of left hip pain and tenderness for past 5-6 weeks. Does not remember any trauma or fall- but does report his mattress been changed just before he started having pain.  The pain is worse with pressure- sitting on the side or sleeping on the side. Does not get worse with walking or hip movements. Pain radiates to the torso but not to the leg.  Patient denies any fever, chills, nausea, vomiting, abdominal pain.  He also complains of intermittent unrelated pain in chest muscles, knee and leg. Requests for 1 extra pill of methadone every day. Discussed with Dr. Stann Mainland and decided to keep it at 5 mg twice a day and not give extra pill total risk of side effects. Discussed this with patient.   Review of Systems    As per history of present illness, all other systems reviewed and negative. Objective:   Physical Exam  General: NAD HEENT: PERRL, EOMI, no scleral icterus Cardiac: S1, S2, RRR, no rubs, murmurs or gallops Pulm: clear to auscultation bilaterally, moving normal volumes of air Abd: soft, nontender, nondistended, BS present Ext: warm and well perfused, no pedal edema. Left lateral hip tenderness to palpation. Scar from hip replacement well-healed. No redness or swelling noted. Neuro: alert and oriented X3, cranial nerves II-XII grossly intact       Assessment & Plan:

## 2012-04-26 NOTE — Assessment & Plan Note (Signed)
L hip pain today. Likely musculoskeletal. No need for xray now. Will get imaging if gets worse. Discussed with patient in detail. He agrees with plan. Continue tramadol.

## 2012-04-26 NOTE — Assessment & Plan Note (Signed)
Followup with dermatologist as requested.

## 2012-04-30 ENCOUNTER — Other Ambulatory Visit: Payer: Self-pay | Admitting: *Deleted

## 2012-04-30 MED ORDER — TRAMADOL HCL 50 MG PO TABS: 50.0000 mg | ORAL_TABLET | Freq: Four times a day (QID) | ORAL | Status: DC | PRN

## 2012-04-30 NOTE — Telephone Encounter (Signed)
Need to fax hard script to pharmacy.

## 2012-05-04 ENCOUNTER — Other Ambulatory Visit: Payer: Self-pay | Admitting: *Deleted

## 2012-05-04 DIAGNOSIS — F329 Major depressive disorder, single episode, unspecified: Secondary | ICD-10-CM

## 2012-05-04 DIAGNOSIS — K5903 Drug induced constipation: Secondary | ICD-10-CM

## 2012-05-04 MED ORDER — SENNOSIDES-DOCUSATE SODIUM 8.6-50 MG PO TABS: 4.0000 | ORAL_TABLET | Freq: Every day | ORAL | Status: DC

## 2012-05-04 MED ORDER — PAROXETINE HCL 40 MG PO TABS: 40.0000 mg | ORAL_TABLET | Freq: Every day | ORAL | Status: DC

## 2012-05-07 ENCOUNTER — Other Ambulatory Visit: Payer: Self-pay | Admitting: *Deleted

## 2012-05-07 DIAGNOSIS — F419 Anxiety disorder, unspecified: Secondary | ICD-10-CM

## 2012-05-07 NOTE — Telephone Encounter (Signed)
Needs refill on Lorazepam 1 mg. Last filled 04/10/12.  [facility] fax [phone].

## 2012-05-08 ENCOUNTER — Telehealth: Payer: Self-pay | Admitting: *Deleted

## 2012-05-08 NOTE — Telephone Encounter (Signed)
Talked with Jackelyn Poling at [facility] [phone]  - they have pt's Rx for methadone for next 3 months but no directions. Talked with K.Goldston and states since April 2013 has been taking med two times a day. Instructions given to Gordon at phone number listed. Sent note to Dr Stann Mainland. Hilda Blades Khaleem Burchill RN 05/08/12 11AM

## 2012-05-10 ENCOUNTER — Telehealth: Payer: Self-pay | Admitting: *Deleted

## 2012-05-10 DIAGNOSIS — G8929 Other chronic pain: Secondary | ICD-10-CM

## 2012-05-10 NOTE — Telephone Encounter (Signed)
Fax from facility stating being given tramadol more than twice daily, directions for dosing need to match qty. Since pt is in facility.- every 6 hrs= 120/ month, #30 at this time, please review and advise

## 2012-05-11 ENCOUNTER — Other Ambulatory Visit: Payer: Self-pay | Admitting: Internal Medicine

## 2012-05-11 MED ORDER — ATORVASTATIN CALCIUM 10 MG PO TABS: 10.0000 mg | ORAL_TABLET | Freq: Every day | ORAL | Status: DC

## 2012-05-11 MED ORDER — LORAZEPAM 1 MG PO TABS: 1.0000 mg | ORAL_TABLET | Freq: Two times a day (BID) | ORAL | Status: DC

## 2012-05-11 MED ORDER — TRAMADOL HCL 50 MG PO TABS: 50.0000 mg | ORAL_TABLET | Freq: Four times a day (QID) | ORAL | Status: DC | PRN

## 2012-05-11 NOTE — Telephone Encounter (Signed)
Rx called in 

## 2012-05-11 NOTE — Telephone Encounter (Signed)
Quantity given per month has been corrected.  Thank You.

## 2012-05-11 NOTE — Telephone Encounter (Signed)
Review of the record reveals that Dr. Stann Mainland, his prior PCP, wanted Zachary [name] to be on lorazepam 1 mg PO (in applesauce) twice a day to be observed for anxiety.  This will be renewed.

## 2012-05-16 ENCOUNTER — Other Ambulatory Visit: Payer: Self-pay | Admitting: Internal Medicine

## 2012-05-16 DIAGNOSIS — G8929 Other chronic pain: Secondary | ICD-10-CM

## 2012-05-16 MED ORDER — METHADONE HCL 5 MG PO TABS: 5.0000 mg | ORAL_TABLET | Freq: Two times a day (BID) | ORAL | Status: DC

## 2012-05-28 ENCOUNTER — Telehealth: Payer: Self-pay | Admitting: *Deleted

## 2012-05-28 NOTE — Telephone Encounter (Signed)
[name] with [facility] needed to check on directions for  Methadone twice a day. [name] RN 05/28/12 1:30PM

## 2012-05-29 ENCOUNTER — Other Ambulatory Visit: Payer: Self-pay | Admitting: *Deleted

## 2012-05-29 DIAGNOSIS — I10 Essential (primary) hypertension: Secondary | ICD-10-CM

## 2012-05-29 MED ORDER — FUROSEMIDE 20 MG PO TABS: 20.0000 mg | ORAL_TABLET | Freq: Every day | ORAL | Status: DC

## 2012-06-07 ENCOUNTER — Other Ambulatory Visit: Payer: Self-pay | Admitting: *Deleted

## 2012-06-07 NOTE — Telephone Encounter (Signed)
Done, confirmed w/ pharm

## 2012-06-15 ENCOUNTER — Other Ambulatory Visit: Payer: Self-pay | Admitting: *Deleted

## 2012-06-15 DIAGNOSIS — G8929 Other chronic pain: Secondary | ICD-10-CM

## 2012-06-15 MED ORDER — TRAMADOL HCL 50 MG PO TABS: 50.0000 mg | ORAL_TABLET | Freq: Four times a day (QID) | ORAL | Status: DC | PRN

## 2012-06-15 NOTE — Telephone Encounter (Signed)
Fax from [place] - needs hard script for Tramadol fax to [place] at [phone]. Thanks

## 2012-06-15 NOTE — Telephone Encounter (Signed)
Prescription sent by fax as requested.

## 2012-06-22 ENCOUNTER — Other Ambulatory Visit: Payer: Self-pay | Admitting: *Deleted

## 2012-07-06 ENCOUNTER — Encounter: Payer: Self-pay | Admitting: Internal Medicine

## 2012-07-06 ENCOUNTER — Ambulatory Visit (INDEPENDENT_AMBULATORY_CARE_PROVIDER_SITE_OTHER): Payer: Medicaid Other | Admitting: Internal Medicine

## 2012-07-06 VITALS — BP 141/83 | HR 82 | Temp 97.9°F | Wt 189.4 lb

## 2012-07-06 DIAGNOSIS — K219 Gastro-esophageal reflux disease without esophagitis: Secondary | ICD-10-CM

## 2012-07-06 DIAGNOSIS — K2970 Gastritis, unspecified, without bleeding: Secondary | ICD-10-CM

## 2012-07-06 DIAGNOSIS — E669 Obesity, unspecified: Secondary | ICD-10-CM | POA: Insufficient documentation

## 2012-07-06 DIAGNOSIS — G8929 Other chronic pain: Secondary | ICD-10-CM

## 2012-07-06 DIAGNOSIS — N138 Other obstructive and reflux uropathy: Secondary | ICD-10-CM

## 2012-07-06 DIAGNOSIS — Z0000 Encounter for general adult medical examination without abnormal findings: Secondary | ICD-10-CM | POA: Insufficient documentation

## 2012-07-06 DIAGNOSIS — I10 Essential (primary) hypertension: Secondary | ICD-10-CM

## 2012-07-06 DIAGNOSIS — N401 Enlarged prostate with lower urinary tract symptoms: Secondary | ICD-10-CM

## 2012-07-06 DIAGNOSIS — F329 Major depressive disorder, single episode, unspecified: Secondary | ICD-10-CM

## 2012-07-06 DIAGNOSIS — K5909 Other constipation: Secondary | ICD-10-CM

## 2012-07-06 DIAGNOSIS — E66811 Obesity, class 1: Secondary | ICD-10-CM

## 2012-07-06 DIAGNOSIS — Z96649 Presence of unspecified artificial hip joint: Secondary | ICD-10-CM

## 2012-07-06 DIAGNOSIS — F411 Generalized anxiety disorder: Secondary | ICD-10-CM

## 2012-07-06 DIAGNOSIS — C4492 Squamous cell carcinoma of skin, unspecified: Secondary | ICD-10-CM | POA: Insufficient documentation

## 2012-07-06 DIAGNOSIS — K5903 Drug induced constipation: Secondary | ICD-10-CM

## 2012-07-06 DIAGNOSIS — F3289 Other specified depressive episodes: Secondary | ICD-10-CM

## 2012-07-06 HISTORY — DX: Obesity, class 1: E66.811

## 2012-07-06 HISTORY — DX: Obesity, unspecified: E66.9

## 2012-07-06 HISTORY — DX: Gastritis, unspecified, without bleeding: K29.70

## 2012-07-06 MED ORDER — METHADONE HCL 5 MG PO TABS: 5.0000 mg | ORAL_TABLET | Freq: Three times a day (TID) | ORAL | Status: DC

## 2012-07-06 MED ORDER — AMLODIPINE BESYLATE 10 MG PO TABS: 10.0000 mg | ORAL_TABLET | Freq: Every day | ORAL | Status: DC

## 2012-07-06 NOTE — Assessment & Plan Note (Signed)
This is likely secondary to his methadone. It may slightly worsened with the increase dosing. He is currently on senna and happy with his bowel regimen. We will followup at the return appointment.

## 2012-07-06 NOTE — Assessment & Plan Note (Signed)
He is up-to-date on all his vaccinations with the exception of the zoster vaccination. We can discuss the zoster vaccination at the followup visit.

## 2012-07-06 NOTE — Assessment & Plan Note (Signed)
He is currently asymptomatic on the omeprazole. We will continue the dose to 20 mg by mouth daily.

## 2012-07-06 NOTE — Assessment & Plan Note (Signed)
A left plain hip x-ray 1-1/2 years ago revealed no evidence of loosening of the prosthesis. His flank pain may be associated with his underlying left hip arthritis/prosthesis. If there is no improvement with the increase in the methadone dosing, we will consider getting another plain film of left hip to assure there is no loosening of his hardware.

## 2012-07-06 NOTE — Assessment & Plan Note (Signed)
His blood pressure was very slightly over target at 141/83 with the goal being less than 140/90. He states he's been compliant with his amlodipine. I switched the formulation so that he would only need to take 1 tablet to get 10 mg daily. I suspect his blood pressure will be much better controlled with improved chronic pain management. Review of his lipid panel back to 2000 reveals no evidence of hyperlipidemia. He therefore may not require continued atorvastatin which he takes at a very low dose. At the next visit I will discuss with him the possibility of stopping the atorvastatin and following up on a lipid panel thereafter. Finally, I advised him to continue with the baby aspirin on a daily basis.

## 2012-07-06 NOTE — Assessment & Plan Note (Signed)
His primary complaint today was bilateral flank pain and left hip pain. He denied any nausea or vomiting. He also denied any change in his bowel habits. He has not had any fevers, shakes, or chills. He feels that if the methadone could be increased back to 3 times a day dosing his pain may be improved. We decided to increase the methadone to 5 mg by mouth 3 times a day. He will assess his need for tramadol. If the increase in methadone results in no longer needing the as needed tramadol this will be discontinued at the next visit. I also raised the concern about depression contributing to his pain. He stated this could be true and he is not sure the Paxil is working.

## 2012-07-06 NOTE — Patient Instructions (Signed)
It was nice to meet you.  I look forward to taking care of you for years to come.  1) Increase the methadone to 5 mg by mouth three times a day.  2) Change the norvasc to 10 mg tablets but still take 10 mg by mouth daily.  3) Stop the lasix.  4) Keep taking the other medications as prescribed.  5) Return at the next available clinic appointment with Dr. Eppie Gibson to evaluate  Improvements in pain and urination at night and to make any more necessary changes.

## 2012-07-06 NOTE — Assessment & Plan Note (Signed)
He complains of nocturia every 3-4 hours. He is compliant with his tamsulosin. I am concerned that Lasix does not help with his bladder outlet obstructive symptoms. I can find no significant indication for the Lasix and I will stop it at this time. If he continues to have significant nocturia, which undoubtedly is impacting upon his sleep, and therefore may be impacting upon his chronic pain, I will consider the initiation of finasteride at the next visit.

## 2012-07-06 NOTE — Assessment & Plan Note (Signed)
He feels he is depressed. He states this is no different than usual. He notes multiple reasons to be depressed, most of which relate to his current living situation in the assisted living facility. He also his saddend that his family has not contacted him, with the exception of a sister who lives locally. He is not sure the Paxil is effective. We decided to try to get better control of his pain to see if that helped his mood. If it did not, we will then switch to a different SSRI medication in hopes of getting better control of his depressed mood.

## 2012-07-06 NOTE — Assessment & Plan Note (Signed)
Anxiety is not a concern for him at this time. He feels the lorazepam is working well. In addition he is on the Paxil for his depression and this may also be contributing to control of his anxiety. We will continue the lorazepam and Paxil at the current doses.

## 2012-07-06 NOTE — Progress Notes (Signed)
  Subjective:    Patient ID: KENNER AGRO, male    DOB: [birth year], 79 y.o.   MRN: [id number]  HPI  Please see the A&P for the status of the pt's chronic medical problems.  Review of Systems  Constitutional: Negative for activity change, appetite change and unexpected weight change.  Eyes: Positive for redness.  Respiratory: Negative for chest tightness, shortness of breath and wheezing.   Cardiovascular: Negative for chest pain and leg swelling.  Gastrointestinal: Positive for abdominal pain and constipation. Negative for diarrhea and abdominal distention.  Genitourinary: Positive for urgency, frequency, flank pain and difficulty urinating. Negative for dysuria and hematuria.  Musculoskeletal: Positive for arthralgias and gait problem. Negative for myalgias, back pain and joint swelling.  Skin: Positive for wound.  Neurological: Positive for headaches. Negative for seizures and syncope.  Hematological: Bruises/bleeds easily.  Psychiatric/Behavioral: Positive for dysphoric mood. The patient is nervous/anxious.       Objective:   Physical Exam  Nursing note and vitals reviewed. Constitutional: He is oriented to person, place, and time. He appears well-developed and well-nourished. No distress.  HENT:  Head: Normocephalic and atraumatic.  Eyes: Left eye exhibits discharge. No scleral icterus.    Cardiovascular: Normal rate, regular rhythm and normal heart sounds.  Exam reveals no gallop and no friction rub.   No murmur heard. Pulmonary/Chest: Effort normal and breath sounds normal. No respiratory distress. He has no wheezes. He has no rales.  Abdominal: Soft. Bowel sounds are normal. He exhibits no distension and no mass. There is tenderness. There is no rebound and no guarding.  Musculoskeletal: He exhibits no edema and no tenderness.  Neurological: He is alert and oriented to person, place, and time. He exhibits normal muscle tone.  Skin: Skin is warm and dry. Rash noted. He is  not diaphoretic.  Psychiatric: His behavior is normal. Judgment and thought content normal. His speech is not rapid and/or pressured, not delayed and not tangential. Cognition and memory are normal. He exhibits a depressed mood.      Assessment & Plan:   Please see problem oriented charting.

## 2012-07-12 IMAGING — RF DG ESOPHAGUS
14 of 22 series · 14 of 24 positions shown · non-contrast
Comparison: 08/27/2009

CLINICAL DATA: Dysphagia.

ESOPHAGUS/BARIUM SWALLOW/TABLET STUDY
TECHNIQUE: Single contrast esophagram was performed.

[Series 1: run · 1 of 29 slices shown (1 of 14)]
[im 1/29]
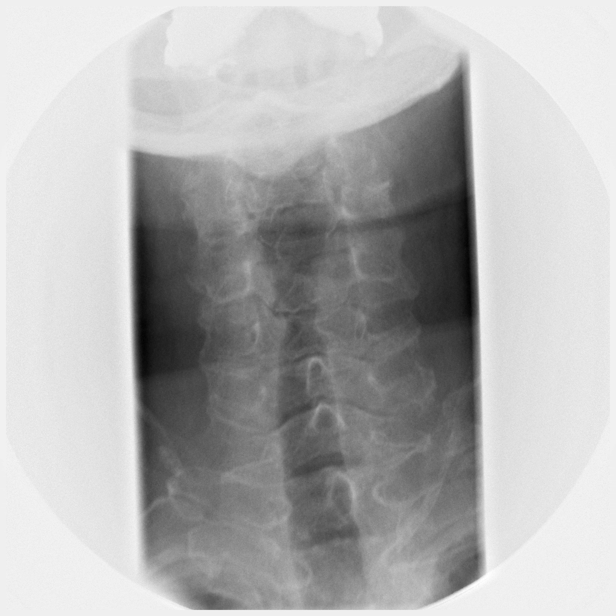

[Series 2: run · 1 of 34 slices shown (2 of 14)]
[im 17/34]
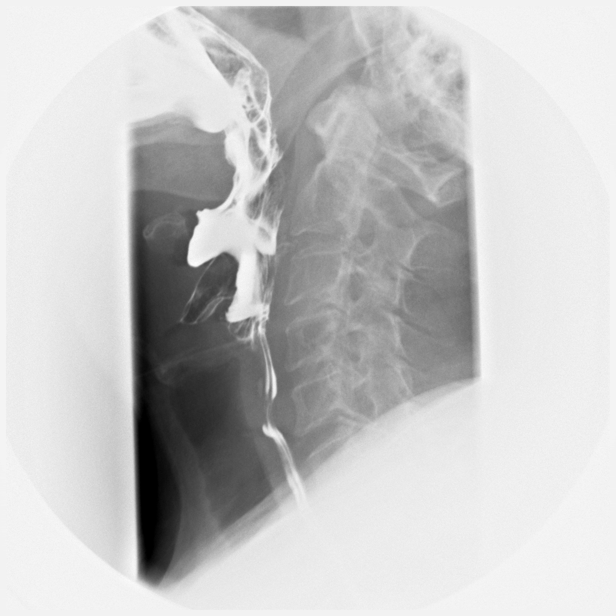

[Series 3: run · 1 of 1 slices shown (3 of 14)]
[im 1/1]
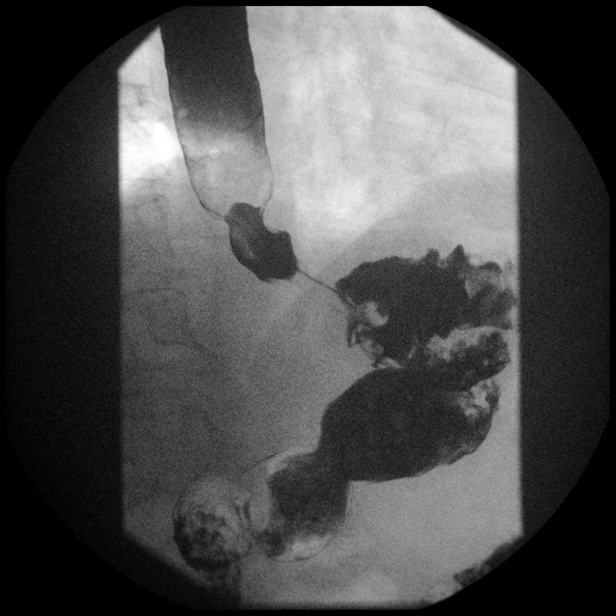

[Series 5: run · 1 of 1 slices shown (4 of 14)]
[im 1/1]
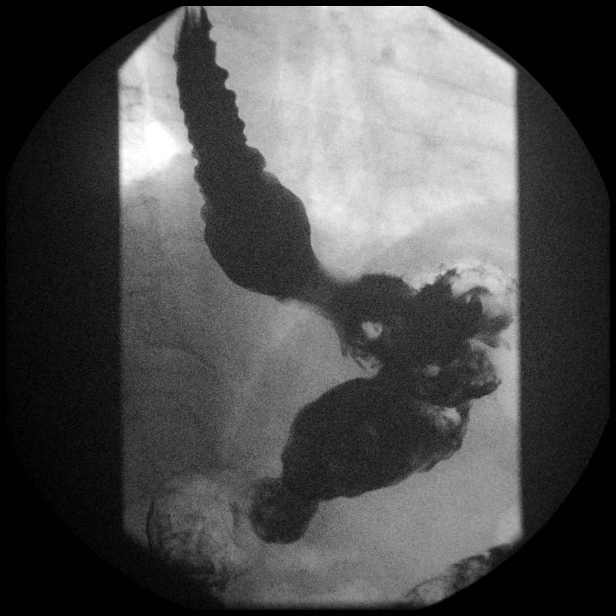

[Series 6: run · 1 of 1 slices shown (5 of 14)]
[im 1/1]
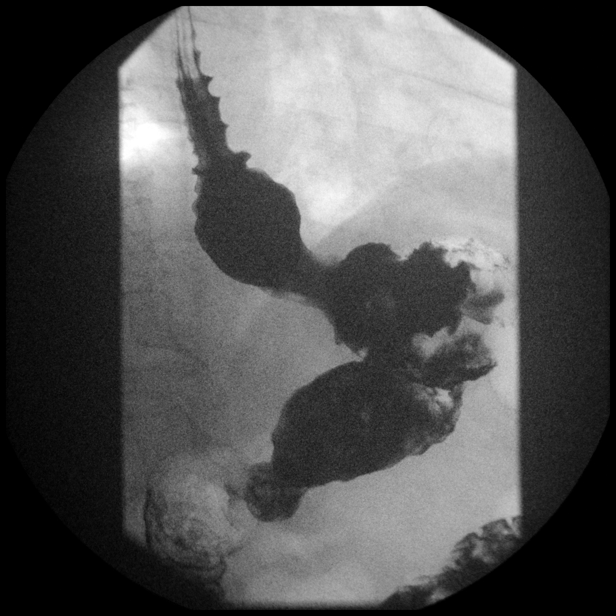

[Series 8: run · 1 of 1 slices shown (6 of 14)]
[im 1/1]
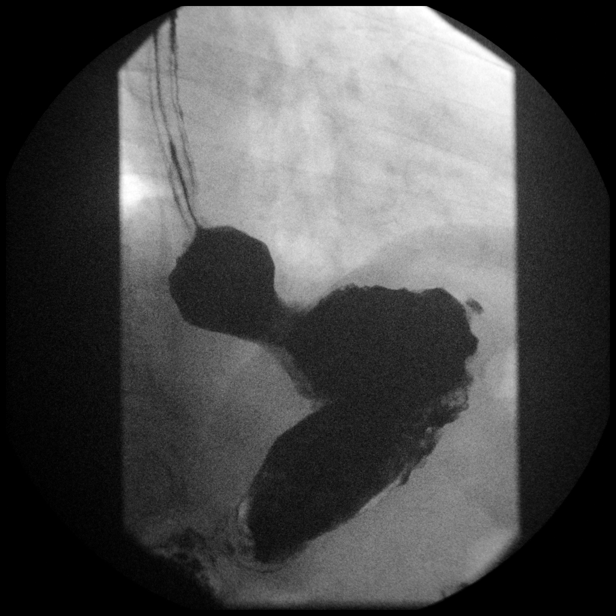

[Series 10: run · 1 of 1 slices shown (7 of 14)]
[im 1/1]
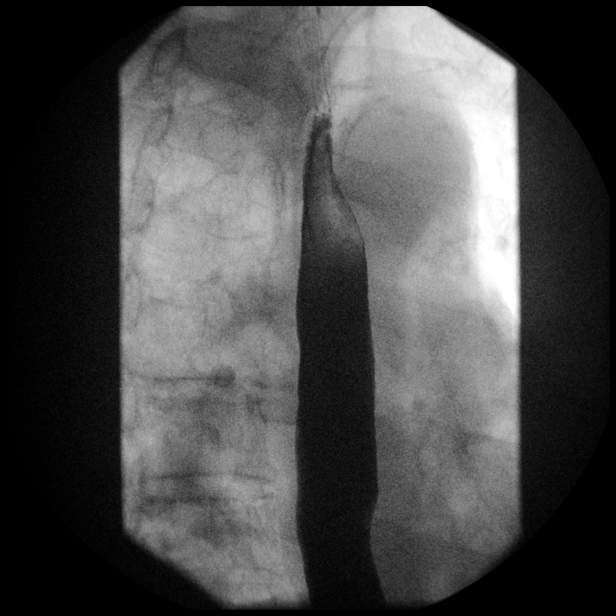

[Series 11: run · 1 of 1 slices shown (8 of 14)]
[im 1/1]
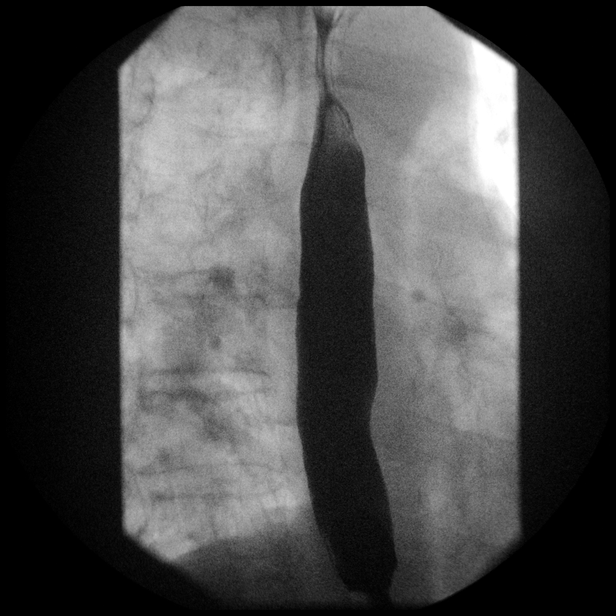

[Series 13: run · 1 of 1 slices shown (9 of 14)]
[im 1/1]
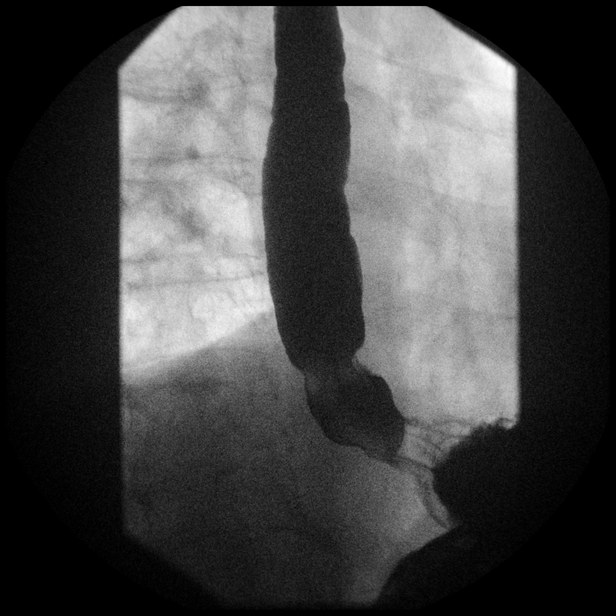

[Series 15: run · 1 of 1 slices shown (10 of 14)]
[im 1/1]
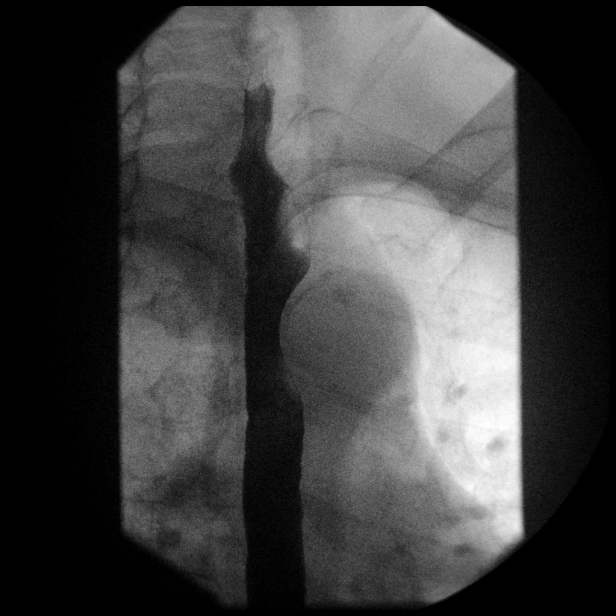

[Series 17: run · 1 of 1 slices shown (11 of 14)]
[im 1/1]
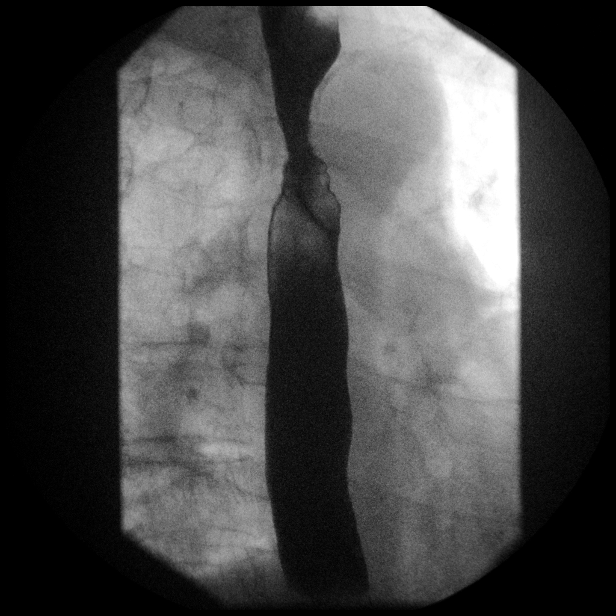

[Series 18: run · 1 of 1 slices shown (12 of 14)]
[im 1/1]
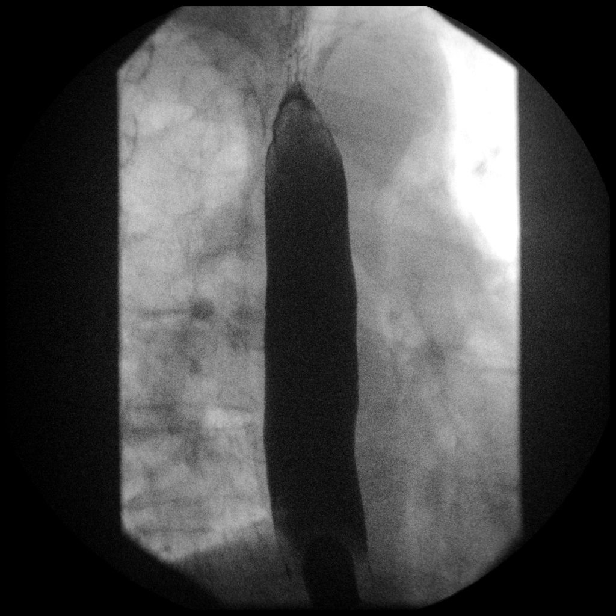

[Series 20: run · 1 of 2 slices shown (13 of 14)]
[im 1/2]
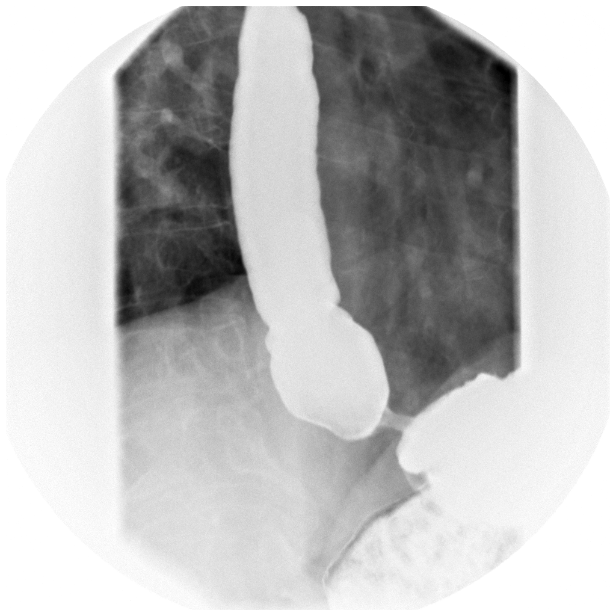

[Series 22: run · 1 of 1 slices shown (14 of 14)]
[im 1/1]
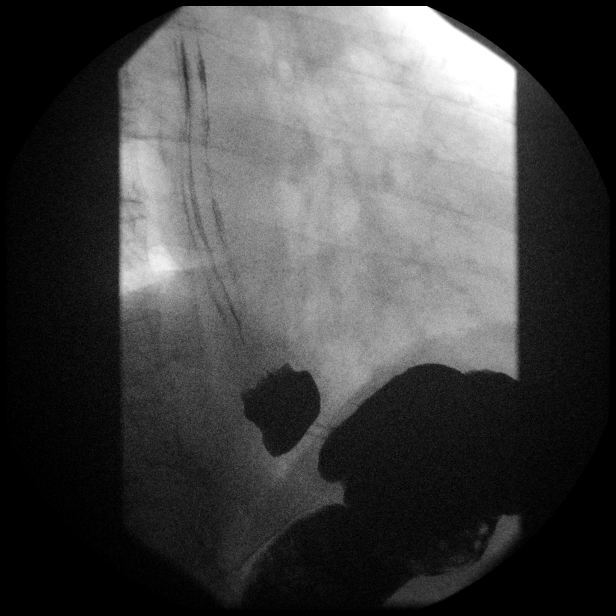

[14 of 24 positions shown; findings below may reference images not displayed]

FINDINGS: On the pharyngeal phase of swallowing, there is laryngeal
penetration nearly to the level of the cords.

Based on the patient's frailty, single contrast examination was
elected and imaging of the esophagus was performed in the left
posterior oblique position.  The patient was unable to assume prone
positioning.

Scattered tertiary contractions occurred during the examination,
and there is a small hiatal hernia.  No esophageal stricture or
narrowing noted; 13 mm barium tablet passed without difficulty into
the stomach.

Primary peristaltic waves were preserved on [DATE] swallows,
borderline for esophageal dysmotility.
IMPRESSION: 1.  Borderline appearance for nonspecific esophageal dysmotility
disorder.
2.  Small hiatal hernia.
3.  No esophageal narrowing/stricture is identified.
4.  Scattered intermittent tertiary esophageal contractions.
4.  Laryngeal penetration of contrast during swallowing, nearly to
the level of the cords.

## 2012-08-27 ENCOUNTER — Other Ambulatory Visit: Payer: Self-pay | Admitting: *Deleted

## 2012-08-27 MED ORDER — METHADONE HCL 5 MG PO TABS: 5.0000 mg | ORAL_TABLET | Freq: Three times a day (TID) | ORAL | Status: DC

## 2012-09-06 ENCOUNTER — Encounter: Payer: Self-pay | Admitting: Internal Medicine

## 2012-09-06 ENCOUNTER — Ambulatory Visit (INDEPENDENT_AMBULATORY_CARE_PROVIDER_SITE_OTHER): Payer: Medicare Other | Admitting: Internal Medicine

## 2012-09-06 VITALS — BP 143/77 | HR 74 | Temp 97.4°F | Wt 193.2 lb

## 2012-09-06 DIAGNOSIS — F411 Generalized anxiety disorder: Secondary | ICD-10-CM

## 2012-09-06 DIAGNOSIS — G894 Chronic pain syndrome: Secondary | ICD-10-CM

## 2012-09-06 DIAGNOSIS — F329 Major depressive disorder, single episode, unspecified: Secondary | ICD-10-CM

## 2012-09-06 DIAGNOSIS — Z5189 Encounter for other specified aftercare: Secondary | ICD-10-CM

## 2012-09-06 MED ORDER — FINASTERIDE 5 MG PO TABS: 5.0000 mg | ORAL_TABLET | Freq: Every day | ORAL | Status: DC

## 2012-09-06 MED ORDER — POLYETHYLENE GLYCOL 3350 17 GM/SCOOP PO POWD: 17.0000 g | Freq: Every day | ORAL | Status: DC | PRN

## 2012-09-06 NOTE — Assessment & Plan Note (Addendum)
His left hip and left flank pain have improved markedly with the increase in the methadone dosing to 3 times a day. He still has some abdominal discomfort particularly in the left lower quadrant near the navel. Examination is unremarkable for any abnormalities including abdominal wall defects. He does have some left eye pain which is chronic. He's had no discharge and there is less redness around the eye than at the last visit. We have scheduled him for an appointment with his ophthalmologist at [facility] at the next available appointment which is in May. Although it is unlikely the atorvastatin was causing any myalgias I could not find a definitive indication for the statin. This was therefore stopped at this visit and we will reassess his myalgias off of the statin at the return visit.

## 2012-09-06 NOTE — Assessment & Plan Note (Signed)
He feels his anxiety is well controlled on the lorazepam at 1 mg by mouth twice a day. He's had no side effects from this medication. We will therefore continue it at this time given its effectiveness. I am hopeful that someday he may be interested in changing his SSRI which may also add some control over his anxiety although the paroxetine may be positively affecting his symptoms at this time.

## 2012-09-06 NOTE — Assessment & Plan Note (Signed)
He remains depressed over the lack of communication he's had with his family who mainly live on the [place]. In some ways he feels isolated. 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 I once again offered to change his antidepressant medicine to something else to see if we could achieve better control of his depression. He was satisfied with the medication and did not want to make any changes at this time.

## 2012-09-06 NOTE — Assessment & Plan Note (Signed)
At the last visit we discontinued the Lasix in hopes of improving his urinary frequency and nocturia. Unfortunately, he has noticed an increase in his nocturia since the last visit to between 4 to 6 times per night. He's been compliant with the tamsulosin. He was therefore willing to try finasteride 5 mg by mouth daily in hopes of getting better symptom control of his benign prostatic hypertrophy with urinary obstruction symptoms. He was told this may take a few weeks before he notices an effect but he should remain on the medication to give it a chance to work.

## 2012-09-06 NOTE — Progress Notes (Signed)
  Subjective:    Patient ID: Zachary [name], male    DOB: 24-Apr-1930, 79 y.o.   MRN: [id number]  HPI  Please see the A&P for the status of the pt's chronic medical problems.  Review of Systems  Constitutional: Negative for fever, activity change, appetite change, fatigue and unexpected weight change.  HENT: Negative for facial swelling.   Eyes: Positive for pain and redness. Negative for photophobia, discharge, itching and visual disturbance.  Respiratory: Negative for cough, chest tightness and shortness of breath.   Cardiovascular: Negative for chest pain and leg swelling.  Gastrointestinal: Positive for abdominal pain and constipation. Negative for nausea, vomiting, diarrhea and abdominal distention.  Genitourinary: Positive for urgency and frequency. Negative for flank pain.  Musculoskeletal: Positive for myalgias. Negative for back pain, joint swelling and arthralgias.  Neurological: Negative for dizziness, weakness, light-headedness, numbness and headaches.  Psychiatric/Behavioral: Positive for dysphoric mood. Negative for confusion and agitation. The patient is nervous/anxious.       Objective:   Physical Exam  Nursing note and vitals reviewed. Constitutional: He is oriented to person, place, and time. He appears well-developed and well-nourished. No distress.  HENT:  Head: Normocephalic and atraumatic.  Eyes: Left eye exhibits no discharge. No scleral icterus.    Neck: Normal range of motion. Neck supple.  Cardiovascular: Normal rate, regular rhythm and normal heart sounds.   Pulmonary/Chest: Effort normal and breath sounds normal. No respiratory distress. He has no wheezes. He has no rales.  Abdominal: Soft. Bowel sounds are normal. He exhibits no distension. There is tenderness. There is no rebound and no guarding.  Slight tenderness to palpation in LLQ near naval.  No abdominal wall defect was appreciated.  Musculoskeletal: Normal range of motion. He exhibits no edema and  no tenderness.  Neurological: He is alert and oriented to person, place, and time.  Skin: He is not diaphoretic.  Psychiatric: His behavior is normal. Judgment and thought content normal. His affect is blunt. He exhibits a depressed mood.      Assessment & Plan:   Please see problem oriented charting.

## 2012-09-06 NOTE — Patient Instructions (Signed)
It was wonderful to see you again.  1) Stop the atorvastatin.  I do not believe you need it.  We will check your cholesterol at the next visit.  You may notice an improvement in your aches by stopping it.  2) We start finasteride 5 mg by mouth daily in hopes of decreasing the number of times you need to get up at night.  3) Keep taking all of your other medications as prescribed.  I will see you back in 3 months to see how the above changes have worked.

## 2012-09-06 NOTE — Assessment & Plan Note (Signed)
His blood pressure was slightly above target today at 143/77. That being said, given his age and the new recommendations this is a completely acceptable blood pressure. We will therefore continue the amlodipine at 10 mg by mouth daily.

## 2012-09-06 NOTE — Assessment & Plan Note (Signed)
With the recent increase in the dose of his methadone he is noted some worsening in his chronic constipation. He is currently on MiraLax and senna. I offered to escalate his bowel regimen but he was quite satisfied with what he was taking and did not want to make any changes at this time. Therefore we will continue his MiraLax and senna.

## 2012-09-07 ENCOUNTER — Ambulatory Visit: Payer: Medicare Other | Admitting: Internal Medicine

## 2012-09-26 ENCOUNTER — Other Ambulatory Visit: Payer: Self-pay | Admitting: *Deleted

## 2012-09-26 DIAGNOSIS — G8929 Other chronic pain: Secondary | ICD-10-CM

## 2012-09-26 DIAGNOSIS — F419 Anxiety disorder, unspecified: Secondary | ICD-10-CM

## 2012-09-26 MED ORDER — METHADONE HCL 5 MG PO TABS: 5.0000 mg | ORAL_TABLET | Freq: Three times a day (TID) | ORAL | Status: DC

## 2012-09-26 MED ORDER — LORAZEPAM 1 MG PO TABS: 1.0000 mg | ORAL_TABLET | Freq: Two times a day (BID) | ORAL | Status: DC

## 2012-09-26 NOTE — Telephone Encounter (Signed)
Refills printed and signed - nurse to complete.

## 2012-09-26 NOTE — Telephone Encounter (Signed)
Rx faxed to [facility] [phone].

## 2012-10-25 ENCOUNTER — Other Ambulatory Visit: Payer: Self-pay | Admitting: *Deleted

## 2012-10-25 DIAGNOSIS — G8929 Other chronic pain: Secondary | ICD-10-CM

## 2012-10-25 DIAGNOSIS — F419 Anxiety disorder, unspecified: Secondary | ICD-10-CM

## 2012-10-25 DIAGNOSIS — G894 Chronic pain syndrome: Secondary | ICD-10-CM

## 2012-10-25 MED ORDER — METHADONE HCL 5 MG PO TABS: 5.0000 mg | ORAL_TABLET | Freq: Three times a day (TID) | ORAL | Status: DC

## 2012-10-25 MED ORDER — LORAZEPAM 1 MG PO TABS: 1.0000 mg | ORAL_TABLET | Freq: Two times a day (BID) | ORAL | Status: DC

## 2012-10-25 NOTE — Telephone Encounter (Signed)
Need rxs faxed to [facility]. Thanks

## 2012-11-27 ENCOUNTER — Other Ambulatory Visit: Payer: Self-pay | Admitting: *Deleted

## 2012-11-27 DIAGNOSIS — G8929 Other chronic pain: Secondary | ICD-10-CM

## 2012-11-27 MED ORDER — TRAMADOL HCL 50 MG PO TABS: 50.0000 mg | ORAL_TABLET | Freq: Four times a day (QID) | ORAL | Status: DC | PRN

## 2012-12-07 ENCOUNTER — Ambulatory Visit (HOSPITAL_COMMUNITY)
Admission: RE | Admit: 2012-12-07 | Discharge: 2012-12-07 | Disposition: A | Discharge status: Home / Self Care | Payer: Medicare Other | Source: Ambulatory Visit | Attending: Internal Medicine | Admitting: Internal Medicine

## 2012-12-07 ENCOUNTER — Ambulatory Visit (INDEPENDENT_AMBULATORY_CARE_PROVIDER_SITE_OTHER): Payer: Medicare Other | Admitting: Internal Medicine

## 2012-12-07 ENCOUNTER — Encounter: Payer: Self-pay | Admitting: Internal Medicine

## 2012-12-07 VITALS — BP 110/72 | HR 80 | Temp 97.0°F | Wt 193.6 lb

## 2012-12-07 DIAGNOSIS — M79609 Pain in unspecified limb: Secondary | ICD-10-CM | POA: Insufficient documentation

## 2012-12-07 DIAGNOSIS — N401 Enlarged prostate with lower urinary tract symptoms: Secondary | ICD-10-CM

## 2012-12-07 DIAGNOSIS — N138 Other obstructive and reflux uropathy: Secondary | ICD-10-CM

## 2012-12-07 DIAGNOSIS — R599 Enlarged lymph nodes, unspecified: Secondary | ICD-10-CM | POA: Insufficient documentation

## 2012-12-07 DIAGNOSIS — M7989 Other specified soft tissue disorders: Secondary | ICD-10-CM | POA: Insufficient documentation

## 2012-12-07 DIAGNOSIS — Z5189 Encounter for other specified aftercare: Secondary | ICD-10-CM

## 2012-12-07 DIAGNOSIS — F419 Anxiety disorder, unspecified: Secondary | ICD-10-CM

## 2012-12-07 DIAGNOSIS — F329 Major depressive disorder, single episode, unspecified: Secondary | ICD-10-CM

## 2012-12-07 DIAGNOSIS — I10 Essential (primary) hypertension: Secondary | ICD-10-CM

## 2012-12-07 DIAGNOSIS — F411 Generalized anxiety disorder: Secondary | ICD-10-CM

## 2012-12-07 DIAGNOSIS — N139 Obstructive and reflux uropathy, unspecified: Secondary | ICD-10-CM

## 2012-12-07 DIAGNOSIS — M1610 Unilateral primary osteoarthritis, unspecified hip: Secondary | ICD-10-CM

## 2012-12-07 DIAGNOSIS — G8929 Other chronic pain: Secondary | ICD-10-CM

## 2012-12-07 HISTORY — DX: Other specified soft tissue disorders: M79.89

## 2012-12-07 LAB — LIPID PANEL
Cholesterol: 190 mg/dL (ref 0–200)
HDL: 41 mg/dL (ref >39)
Total CHOL/HDL Ratio: 4.6 Ratio
Triglycerides: 149 mg/dL (ref <150)
VLDL: 30 mg/dL (ref 0–40)

## 2012-12-07 MED ORDER — TRAMADOL HCL 50 MG PO TABS: 50.0000 mg | ORAL_TABLET | Freq: Four times a day (QID) | ORAL | Status: DC | PRN

## 2012-12-07 NOTE — Progress Notes (Signed)
  Subjective:    Patient ID: Zachary [name], male    DOB: [birth year], 79 y.o.   MRN: [id number]  HPI  Please see the A&P for the status of the pt's chronic medical problems.  Review of Systems  Constitutional: Negative for fever, activity change, appetite change and unexpected weight change.  Eyes: Positive for redness. Negative for pain and itching.  Respiratory: Negative for shortness of breath and wheezing.   Cardiovascular: Positive for chest pain and leg swelling. Negative for palpitations.  Gastrointestinal: Positive for abdominal pain and constipation. Negative for nausea, vomiting, diarrhea and abdominal distention.  Genitourinary: Positive for frequency.  Musculoskeletal: Positive for myalgias and arthralgias. Negative for back pain and joint swelling.  Skin: Positive for color change and rash.  Allergic/Immunologic: Negative for environmental allergies.  Neurological: Positive for dizziness and light-headedness. Negative for seizures, syncope, speech difficulty, weakness and numbness.  Psychiatric/Behavioral: Positive for dysphoric mood. The patient is nervous/anxious.       Objective:   Physical Exam  Nursing note and vitals reviewed. Constitutional: He is oriented to person, place, and time. He appears well-developed and well-nourished. No distress.  HENT:  Head: Normocephalic and atraumatic.  Eyes: Right eye exhibits no discharge. Left eye exhibits no discharge. No scleral icterus.    Neck: Neck supple. No tracheal deviation present.  Cardiovascular: Normal rate, regular rhythm and normal heart sounds.  Exam reveals no gallop and no friction rub.   No murmur heard. Pulmonary/Chest: Effort normal and breath sounds normal. No stridor. No respiratory distress. He has no wheezes. He has no rales. He exhibits tenderness.  Abdominal: Soft. Bowel sounds are normal. He exhibits no distension. There is tenderness. There is no rebound and no guarding.  Musculoskeletal: Normal  range of motion. He exhibits edema. He exhibits no tenderness.  Left lower extremity with 2+ pitting edema to mid-shin.  No edema of right lower extremity  Neurological: He is alert and oriented to person, place, and time. He exhibits normal muscle tone.  Skin: Skin is warm and dry. Rash noted. He is not diaphoretic. There is erythema. There is pallor.  Psychiatric: His affect is blunt. He is not slowed.      Assessment & Plan:   Please see problem oriented charting.

## 2012-12-07 NOTE — Assessment & Plan Note (Signed)
His depression is reasonably well controlled on the Paxil 40 mg by mouth daily. Father's Day was difficult for him given his isolation from his family on the [place]. Nonetheless, he is starting to feel better and does not want to make any changes in his Paxil. We will therefore continue at the current dose.

## 2012-12-07 NOTE — Assessment & Plan Note (Signed)
We are continuing the tramadol and the methadone which is providing reasonable control of his chronic hip pain.

## 2012-12-07 NOTE — Assessment & Plan Note (Signed)
He notes continued abdominal pain that is slightly superior and left of the umbilicus. This is in a different location from the periumbilical pain he had last time. Examination is benign without any guarding, tenderness, or rebound. It occurs at any time and not necessarily only after eating. The methadone and tramadol are effective and he would like to continue this therapy at the current doses. This will be the plan. We will continue to follow clinically.

## 2012-12-07 NOTE — Assessment & Plan Note (Signed)
His blood pressure is well controlled on the amlodipine 10 mg by mouth daily. At 110/72 he is well within target. We will therefore continue the amlodipine.

## 2012-12-07 NOTE — Assessment & Plan Note (Signed)
He is unsure if he has started the finasteride as prescribed at the last visit. I sent a note to his assisted-living facility asking that they followup on this and make sure that he does take the finasteride. At this point he notes he's continued to have nocturia nearly every hour. We will reassess at the followup visit as to whether or not he is taking the finasteride. At that point we'll have a better idea if it helped his symptoms. In the meantime we will continue the tamsulosin 0.4 mg by mouth daily.

## 2012-12-07 NOTE — Assessment & Plan Note (Signed)
He continues to take the MiraLax with some success in control of his constipation related to his chronic methadone. He is not interested in modifying his regimen at this time.
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
## 2012-12-07 NOTE — Progress Notes (Signed)
Left lower extremity venous duplex completed.  Left:  No evidence of DVT, superficial thrombosis, or Baker's cyst.  Right:  Negative for DVT in the common femoral vein.  

## 2012-12-07 NOTE — Patient Instructions (Addendum)
It was wonderful to see you again.  You continue to do well on your medications.  Keep taking all of your medications as you have.  I think they are keeping your pain, anxiety, and depression stable.  I will let you know if your cholesterol was too high when I get the results back early next week.  Otherwise, I will see you in 3 months, sooner if necessary.  Fortunately, your left leg scan was without any blood clots.

## 2012-12-07 NOTE — Assessment & Plan Note (Signed)
His anxiety is well controlled from his standpoint on the lorazepam 1 mg by mouth every 12 hours and Paxil 40 mg by mouth daily. He does not want to make any changes in her regimen at this time and therefore we will continue at the current doses.

## 2012-12-10 NOTE — Progress Notes (Signed)
Total cholesterol 190 Triglycerides 149 HDL 41 LDL 119  Lipid profile reasonable on no statin given age and only 1 other risk factor (HTN).  Will not restart statin therapy.

## 2012-12-27 ENCOUNTER — Other Ambulatory Visit: Payer: Self-pay | Admitting: *Deleted

## 2012-12-27 DIAGNOSIS — G894 Chronic pain syndrome: Secondary | ICD-10-CM

## 2012-12-27 NOTE — Telephone Encounter (Signed)
Please print aug, sept, oct  Thanks,h

## 2012-12-28 MED ORDER — METHADONE HCL 5 MG PO TABS: 5.0000 mg | ORAL_TABLET | Freq: Three times a day (TID) | ORAL | Status: DC

## 2012-12-28 NOTE — Telephone Encounter (Signed)
Methadone rxs x 4 faxed to [facility] and they were received per [place].

## 2013-01-16 ENCOUNTER — Ambulatory Visit (INDEPENDENT_AMBULATORY_CARE_PROVIDER_SITE_OTHER): Payer: Medicare Other | Admitting: Internal Medicine

## 2013-01-16 ENCOUNTER — Ambulatory Visit (HOSPITAL_COMMUNITY)
Admission: RE | Admit: 2013-01-16 | Discharge: 2013-01-16 | Disposition: A | Discharge status: Home / Self Care | Payer: Medicare Other | Source: Ambulatory Visit | Attending: Internal Medicine | Admitting: Internal Medicine

## 2013-01-16 ENCOUNTER — Encounter: Payer: Self-pay | Admitting: Internal Medicine

## 2013-01-16 VITALS — BP 139/75 | HR 79 | Temp 97.0°F | Ht 67.0 in | Wt 198.5 lb

## 2013-01-16 DIAGNOSIS — R609 Edema, unspecified: Secondary | ICD-10-CM

## 2013-01-16 DIAGNOSIS — C4492 Squamous cell carcinoma of skin, unspecified: Secondary | ICD-10-CM

## 2013-01-16 DIAGNOSIS — R600 Localized edema: Secondary | ICD-10-CM

## 2013-01-16 DIAGNOSIS — M7989 Other specified soft tissue disorders: Secondary | ICD-10-CM

## 2013-01-16 DIAGNOSIS — R141 Gas pain: Secondary | ICD-10-CM | POA: Insufficient documentation

## 2013-01-16 DIAGNOSIS — I10 Essential (primary) hypertension: Secondary | ICD-10-CM

## 2013-01-16 DIAGNOSIS — R142 Eructation: Secondary | ICD-10-CM | POA: Insufficient documentation

## 2013-01-16 DIAGNOSIS — E669 Obesity, unspecified: Secondary | ICD-10-CM | POA: Insufficient documentation

## 2013-01-16 DIAGNOSIS — R599 Enlarged lymph nodes, unspecified: Secondary | ICD-10-CM | POA: Insufficient documentation

## 2013-01-16 DIAGNOSIS — G894 Chronic pain syndrome: Secondary | ICD-10-CM

## 2013-01-16 DIAGNOSIS — R109 Unspecified abdominal pain: Secondary | ICD-10-CM | POA: Insufficient documentation

## 2013-01-16 DIAGNOSIS — T50905S Adverse effect of unspecified drugs, medicaments and biological substances, sequela: Secondary | ICD-10-CM

## 2013-01-16 MED ORDER — CLINDAMYCIN HCL 300 MG PO CAPS: 300.0000 mg | ORAL_CAPSULE | Freq: Three times a day (TID) | ORAL | Status: DC

## 2013-01-16 NOTE — Patient Instructions (Addendum)
Please take 1 tablet of clindamycin three times a day for 14 days  Follow up with our clinic on Friday 01/18/13

## 2013-01-16 NOTE — Assessment & Plan Note (Addendum)
A:  The patients acute on chronic left leg swelling with redness and pain is likely due to cellulitis in the setting of chronic venous insufficiency. It is unlikely to be secondary to DVT as his left LE venous doppler was negative for DVT taken today.   P:  I recommended that the patient take clindamycin for his left LE cellulitis. The patient is in agreement with this plan. I recommend that he f/u with our clinic on Friday to ensure that he is doing well. At his visit on Friday, I plan to recommend starting leg stockings for chronic LE edema.

## 2013-01-16 NOTE — Progress Notes (Signed)
Left lower extremity venous duplex completed.  Left:  No evidence of DVT, superficial thrombosis, or Baker's cyst.  Right:  Negative for DVT in the common femoral vein.  
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
## 2013-01-16 NOTE — Assessment & Plan Note (Addendum)
A:  The patient is doing well with Miralax. However, he continues to have significant pain and distension of the abdomen. Todays abdominal x-ray today indicated no acute abdominal process.  P:  Continue current management of constipation.  He is in agreement with this plan.

## 2013-01-18 ENCOUNTER — Encounter: Payer: Self-pay | Admitting: Internal Medicine

## 2013-01-18 ENCOUNTER — Ambulatory Visit (INDEPENDENT_AMBULATORY_CARE_PROVIDER_SITE_OTHER): Payer: Medicare Other | Admitting: Internal Medicine

## 2013-01-18 VITALS — BP 125/65 | HR 78 | Temp 98.2°F | Ht 67.0 in | Wt 197.3 lb

## 2013-01-18 DIAGNOSIS — M7989 Other specified soft tissue disorders: Secondary | ICD-10-CM

## 2013-01-18 DIAGNOSIS — I10 Essential (primary) hypertension: Secondary | ICD-10-CM

## 2013-01-18 NOTE — Progress Notes (Signed)
I saw and evaluated the patient.  I personally confirmed the key portions of the history and exam documented by Dr. Kazibwe and I reviewed pertinent patient test results.  The assessment, diagnosis, and plan were formulated together and I agree with the documentation in the resident's note. 

## 2013-01-18 NOTE — Assessment & Plan Note (Addendum)
Cellulitis seems stable at this time though hard to say whether there is any improvement yet. No interim symptoms. Patient started taking clindamycin yesterday. Dopplers were negative for DVT. Physical examination does not reveal extension of the cellulitis (Dr. Marinda Elk re-examined the leg. He saw the patient on previous visit). The etiology is multifactorial, including venous insufficiency, for which he has never been evaluated and the possibility of an acute cellulitis.  Plan  - No indication for admission for antibiotic therapy at this time.  - He will continue with the clindamycin to complete 14 days as outpatient as antibiotic therapy -I have counseled the patient to elevate his leg above his level of the heart for as long as he can. - I have encouraged him to call the clinic if he develops fevers, chills, nausea, vomiting, or reduced appetite, increased pain in the leg or swelling or any open wounds. - I will consider starting compression stockings - I will see patient in 5 days for reevaluation of this problem.

## 2013-01-18 NOTE — Progress Notes (Signed)
Patient ID: Zachary [name], male   DOB: 1930-06-09, 79 y.o.   MRN: [id number]  HPI: Zachary [name] is a 79 y.o. with past medical history of hypertension, depression, and anxiety, chronic pain, GERD, and a recent evaluation for left lower extremity swelling, presents to the clinic for followup visit of the leg swelling which was thought to be secondary to chronic venous insufficiency and possible cellulitis.  He was seen 3 days ago, and lower extremity Dopplers were negative for DVT. Patient reports that he has not noticed any worsening of the leg swelling or pain, fevers, chills, nausea, vomiting, or any other constitutional symptoms. He feels the leg is about the same. He started taking his clindamycin yesterday. The clindamycin was prescribed for 14 days.  Please see the A&P for the status of the pt's chronic medical problems.   Past Medical History  Diagnosis Date  . Chronic pain disorder 06/22/2006  . Depression 06/22/2006  . Anxiety 06/22/2006  . Essential hypertension 06/22/2006  . Gastroesophageal reflux disease with stricture 06/22/2006    With esophagitis, requiring dilatation 08/02/2006   . Diverticulosis of colon 06/22/2006  . Benign prostatic hypertrophy with urinary obstruction 06/22/2006  . Arthritis of hip 05/21/2010    s/p left hip total arthroplasty   . Constipation due to pain medication 06/03/2011  . Gastritis 07/06/2012    Seen on EGD 08/02/2006   . Obesity (BMI 30.0-34.9) 07/06/2012  . Facial basal cell cancer 06/22/2006  . Squamous cell skin cancer 07/06/2012    In situ   . Irritable bowel syndrome   . Alcoholism     Remote  . Cataract    Current Outpatient Prescriptions  Medication Sig Dispense Refill  . amLODipine (NORVASC) 10 MG tablet Take 1 tablet (10 mg total) by mouth daily.  30 tablet  11  . artificial tears (LACRILUBE) OINT ophthalmic ointment Use as directed in left eye at bedtime  3.5 g  6  . clindamycin (CLEOCIN) 300 MG capsule Take 1 capsule (300 mg total) by  mouth 3 (three) times daily.  42 capsule  0  . feeding supplement (ENSURE IMMUNE HEALTH) LIQD Take 237 mLs by mouth 3 (three) times daily with meals.  5688 mL  11  . finasteride (PROSCAR) 5 MG tablet Take 1 tablet (5 mg total) by mouth daily.  30 tablet  11  . LORazepam (ATIVAN) 1 MG tablet Take 1 tablet (1 mg total) by mouth every 12 (twelve) hours. In applesauce, dosing to be supervised by caregiver  60 tablet  5  . methadone (DOLOPHINE) 5 MG tablet Take 1 tablet (5 mg total) by mouth every 8 (eight) hours.  90 tablet  0  . Multiple Vitamin (TAB-A-VITE) TABS Take 1 tablet by mouth daily.  30 tablet  11  . mupirocin (BACTROBAN) 2 % ointment       . omeprazole (PRILOSEC) 20 MG capsule Take 1 capsule (20 mg total) by mouth daily.  30 capsule  6  . PARoxetine (PAXIL) 40 MG tablet Take 1 tablet (40 mg total) by mouth daily. Please note change in tablet.  Take just one daily from now on.  30 tablet  11  . polyethylene glycol powder (MIRALAX) powder Take 17 g by mouth daily as needed. Use as directed.  527 g  11  . protein supplement (PROMOD) POWD Take 24gm by mouth 3(three) times daily with meals.  454 g  11  . senna-docusate (SENOKOT-S) 8.6-50 MG per tablet Take 4 tablets by mouth  at bedtime.  120 tablet  11  . Tamsulosin HCl (FLOMAX) 0.4 MG CAPS Take 1 capsule (0.4 mg total) by mouth daily.  30 capsule  11  . tobramycin-dexamethasone (TOBRADEX) ophthalmic solution       . traMADol (ULTRAM) 50 MG tablet Take 1 tablet (50 mg total) by mouth every 6 (six) hours as needed for pain.  120 tablet  5  . triamcinolone (KENALOG) 0.025 % cream       . aspirin 81 MG EC tablet Take 1 tablet (81 mg total) by mouth daily. Swallow whole.  30 tablet  12   No current facility-administered medications for this visit.   Family History  Problem Relation Age of Onset  . Pneumonia Mother 71  . Heart attack Father 77  . Early death Brother     Specifics unknown  . Healthy Daughter   . Healthy Son   . Heart disease  Brother     Specifics unknown  . Pancreatic cancer Sister   . Early death Sister 3    Hit by truck on [address]  . Healthy Daughter    History   Social History  . Marital Status: Widowed    Spouse Name: N/A    Number of Children: N/A  . Years of Education: N/A   Social History Main Topics  . Smoking status: Former Smoker -- 1.00 packs/day for 25 years    Quit date: 06/20/1978  . Smokeless tobacco: Never Used  . Alcohol Use: No  . Drug Use: No  . Sexually Active: No   Other Topics Concern  . None   Social History Narrative   Zachary [name] has been divorced for decades. He has 2 adult children probably in [place] with no contact.  Had a sig other who died years.  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  Maintains some contact with his sister and some nephews, but seems to have a lonely existence.  He lives in an assisting living facility in [place].  History has included smoking and periods of drinking (details of this are unclear)    Review of Systems: Constitutional: Denies fever, chills, diaphoresis, appetite change and fatigue.  Respiratory: Denies SOB, DOE, cough, chest tightness, and wheezing.  Cardiovascular: No chest pain, palpitations and leg swelling.  Gastrointestinal: . He reports chronic lower abdominal pain, with some episodes of loose stools. No overt diarrhea. He uses Miralax as needed for constipation. No bloody stools Genitourinary: No dysuria, frequency, hematuria, or flank pain. Musculoskeletal: No myalgias, back pain, joint swelling, arthralgias    Objective:  Physical Exam: Filed Vitals:   01/18/13 0925  BP: 125/65  Pulse: 78  Temp: 98.2 F (36.8 C)  TempSrc: Oral  Height: 5\' 7"  (1.702 m)  Weight: 197 lb 4.8 oz (89.495 kg)  SpO2: 94%   General: , elderly gentleman, Well nourished. No acute distress.  Lungs: CTA bilaterally. Heart: RRR; no extra sounds or  murmurs  Abdomen: Soft, nontender; no hepatosplenomegaly  Extremities:  Left lower extremity edema extending to the level above mid shin with some redness (without extension compared to 3 days ago). There is tenderness is around the mid calf to 5 inches below the knee. No open ulcers and careful examination between his toes does not reveal any wounds or drainage. Slight increased warmth compared to the right. The pulses are present with good capillary refill. The skin is hairless.  Neurologic: Alert and oriented x3. No obvious neurologic deficits.  Assessment & Plan:  I have discussed my assessment and plan  with Dr. Marinda Elk  as detailed under problem based charting.

## 2013-01-18 NOTE — Progress Notes (Signed)
I saw and evaluated the patient.  I personally confirmed the key portions of the history and exam documented by Dr. Komanski and I reviewed pertinent patient test results.  The assessment, diagnosis, and plan were formulated together and I agree with the documentation in the resident's note.  

## 2013-01-18 NOTE — Patient Instructions (Addendum)
Please elevate the leg above your chest level for the most time of the day and night.  Please continue taking your antibiotics to complete the 14 days of treatment. If you notice fever, chills, nausea, or vomiting this might be a sign of increased infection and he should, therefore, call the clinic for evaluation. Otherwise please come back in about 5 days for further assessment of your leg swelling.     Treatment Goals:  Goals (1 Years of Data) as of 01/18/13         As of Today 01/16/13 12/07/12 09/06/12 07/06/12     Blood Pressure    . Blood Pressure < 140/90  125/65 139/75 110/72 143/77 141/83      Progress Toward Treatment Goals:  Treatment Goal 01/18/2013  Blood pressure at goal    Self Care Goals & Plans:  Self Care Goal 01/18/2013  Manage my medications take my medicines as prescribed; bring my medications to every visit; refill my medications on time  Eat healthy foods drink diet soda or water instead of juice or soda; eat more vegetables; eat foods that are low in salt; eat baked foods instead of fried foods  Be physically active -       Care Management & Community Referrals:  Referral 12/07/2012  Referrals made for care management support none needed  Referrals made to community resources none        Cellulitis Cellulitis is an infection of the skin and the tissue under the skin. The infected area is usually red and tender. This happens most often in the arms and lower legs. HOME CARE   Take your antibiotic medicine as told. Finish the medicine even if you start to feel better.  Keep the infected arm or leg raised (elevated).  Put a warm cloth on the area up to 4 times per day.  Only take medicines as told by your doctor.  Keep all doctor visits as told. GET HELP RIGHT AWAY IF:   You have a fever.  You feel very sleepy.  You throw up (vomit) or have watery poop (diarrhea).  You feel sick and have muscle aches and pains.  You see red streaks on the  skin coming from the infected area.  Your red area gets bigger or turns a dark color.  Your bone or joint under the infected area is painful after the skin heals.  Your infection comes back in the same area or different area.  You have a puffy (swollen) bump in the infected area.  You have new symptoms. MAKE SURE YOU:   Understand these instructions.  Will watch your condition.  Will get help right away if you are not doing well or get worse. Document Released: 11/23/2007 Document Revised: 12/06/2011 Document Reviewed: 08/22/2011 [employer] [employer] Patient Information 2014 [place], [place].

## 2013-01-23 ENCOUNTER — Ambulatory Visit (INDEPENDENT_AMBULATORY_CARE_PROVIDER_SITE_OTHER): Payer: Medicare Other | Admitting: Internal Medicine

## 2013-01-23 ENCOUNTER — Encounter: Payer: Self-pay | Admitting: Internal Medicine

## 2013-01-23 VITALS — BP 108/59 | HR 85 | Temp 98.7°F | Ht 67.0 in | Wt 198.8 lb

## 2013-01-23 DIAGNOSIS — G894 Chronic pain syndrome: Secondary | ICD-10-CM

## 2013-01-23 DIAGNOSIS — L270 Generalized skin eruption due to drugs and medicaments taken internally: Secondary | ICD-10-CM

## 2013-01-23 DIAGNOSIS — M7989 Other specified soft tissue disorders: Secondary | ICD-10-CM

## 2013-01-23 MED ORDER — METHADONE HCL 5 MG PO TABS: 5.0000 mg | ORAL_TABLET | Freq: Three times a day (TID) | ORAL | Status: DC

## 2013-01-23 MED ORDER — FUROSEMIDE 20 MG PO TABS: 20.0000 mg | ORAL_TABLET | ORAL | Status: DC

## 2013-01-23 NOTE — Progress Notes (Signed)
Patient ID: BRYKER PULVER, male   DOB: [birth year], 79 y.o.   MRN: [id number]          HPI: Mr.Mykell E Levi is a 79 y.o. with past medical history as indicated below, presents to the clinic for followup of the left lower extremity edema, which was initially felt to be from cellulitis in the setting of chronic venous insufficiency. Patient reports that he has not gotten any improvement from his clindamycin, which he has been taking over the past 7 days. He feels that his leg is more swollen, however, he denies any constitutional symptoms, like fevers, chills, nausea, vomiting. He feels also that his pain in the lower extremities is a little worse. Chart review reveals that he has gained about 11 pounds since his furosemide was stopped 8 months ago.   In addition, the patient noted a rash over his entire trunk while he was dressing up for his clinic appointment today. The rash is not itchy, and has been present for an unspecified period of time - likely over the last few days since I didn't see it on his previous visit last week. He reports that his been compliant with his clindamycin.   He also reports that he continues to wake up about 4-5 times every night to urinate. This has not changed since stopping the furosemide 8 months ago. His primary care physician had stop the furosemide due to a concern of nocturia interrupting his sleep.  No other complaints.    Past Medical History  Diagnosis Date  . Chronic pain disorder 06/22/2006  . Depression 06/22/2006  . Anxiety 06/22/2006  . Essential hypertension 06/22/2006  . Gastroesophageal reflux disease with stricture 06/22/2006    With esophagitis, requiring dilatation 08/02/2006   . Diverticulosis of colon 06/22/2006  . Benign prostatic hypertrophy with urinary obstruction 06/22/2006  . Arthritis of hip 05/21/2010    s/p left hip total arthroplasty   . Constipation due to pain medication 06/03/2011  . Gastritis 07/06/2012    Seen on EGD 08/02/2006   .  Obesity (BMI 30.0-34.9) 07/06/2012  . Facial basal cell cancer 06/22/2006  . Squamous cell skin cancer 07/06/2012    In situ   . Irritable bowel syndrome   . Alcoholism     Remote  . Cataract    Current Outpatient Prescriptions  Medication Sig Dispense Refill  . amLODipine (NORVASC) 10 MG tablet Take 1 tablet (10 mg total) by mouth daily.  30 tablet  11  . artificial tears (LACRILUBE) OINT ophthalmic ointment Use as directed in left eye at bedtime  3.5 g  6  . aspirin 81 MG EC tablet Take 1 tablet (81 mg total) by mouth daily. Swallow whole.  30 tablet  12  . feeding supplement (ENSURE IMMUNE HEALTH) LIQD Take 237 mLs by mouth 3 (three) times daily with meals.  5688 mL  11  . finasteride (PROSCAR) 5 MG tablet Take 1 tablet (5 mg total) by mouth daily.  30 tablet  11  . furosemide (LASIX) 20 MG tablet Take 1 tablet (20 mg total) by mouth every morning.  30 tablet  3  . LORazepam (ATIVAN) 1 MG tablet Take 1 tablet (1 mg total) by mouth every 12 (twelve) hours. In applesauce, dosing to be supervised by caregiver  60 tablet  5  . methadone (DOLOPHINE) 5 MG tablet Take 1 tablet (5 mg total) by mouth every 8 (eight) hours.  90 tablet  0  . Multiple Vitamin (TAB-A-VITE) TABS Take  1 tablet by mouth daily.  30 tablet  11  . mupirocin (BACTROBAN) 2 % ointment       . omeprazole (PRILOSEC) 20 MG capsule Take 1 capsule (20 mg total) by mouth daily.  30 capsule  6  . PARoxetine (PAXIL) 40 MG tablet Take 1 tablet (40 mg total) by mouth daily. Please note change in tablet.  Take just one daily from now on.  30 tablet  11  . polyethylene glycol powder (MIRALAX) powder Take 17 g by mouth daily as needed. Use as directed.  527 g  11  . protein supplement (PROMOD) POWD Take 24gm by mouth 3(three) times daily with meals.  454 g  11  . senna-docusate (SENOKOT-S) 8.6-50 MG per tablet Take 4 tablets by mouth at bedtime.  120 tablet  11  . Tamsulosin HCl (FLOMAX) 0.4 MG CAPS Take 1 capsule (0.4 mg total) by mouth  daily.  30 capsule  11  . tobramycin-dexamethasone (TOBRADEX) ophthalmic solution       . traMADol (ULTRAM) 50 MG tablet Take 1 tablet (50 mg total) by mouth every 6 (six) hours as needed for pain.  120 tablet  5  . triamcinolone (KENALOG) 0.025 % cream        No current facility-administered medications for this visit.   Family History  Problem Relation Age of Onset  . Pneumonia Mother 16  . Heart attack Father 53  . Early death Brother     Specifics unknown  . Healthy Daughter   . Healthy Son   . Heart disease Brother     Specifics unknown  . Pancreatic cancer Sister   . Early death Sister 3    Hit by truck on [address]  . Healthy Daughter    History   Social History  . Marital Status: Widowed    Spouse Name: N/A    Number of Children: N/A  . Years of Education: N/A   Social History Main Topics  . Smoking status: Former Smoker -- 1.00 packs/day for 25 years    Quit date: 06/20/1978  . Smokeless tobacco: Never Used  . Alcohol Use: No  . Drug Use: No  . Sexually Active: No   Other Topics Concern  . None   Social History Narrative   Mr Kenoyer has been divorced for decades. He has 2 adult children probably in [place] with no contact.  Had a sig other who died years.  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  Maintains some contact with his sister and some nephews, but seems to have a lonely existence.  He lives in an assisting living facility in [place].  History has included smoking and periods of drinking (details of this are unclear)    Review of Systems: As per history of present illness   Objective:  Physical Exam: Filed Vitals:   01/23/13 1040  BP: 108/59  Pulse: 85  Temp: 98.7 F (37.1 C)  TempSrc: Oral  Height: 5\' 7"  (1.702 m)  Weight: 198 lb 12.8 oz (90.175 kg)  SpO2: 94%   General: Well nourished. No acute distress.  Skin: Generalized macular rash  distributed around the chest and the back. Looks a little erythematous, but is not raised. No scratch marks. Lungs: CTA bilaterally. Heart: RRR; no extra sounds or murmurs  Abdomen: Non-distended, normal BS, soft, nontender; no hepatosplenomegaly  Extremities: Left lower extremity edema seems to have  worsened since I last him last week. It now extends to the level of the knee. He also has erythema around the mid shin with some seen. Fluid seeping through his skin. No increased warmth, however, there is some cuff tenderness. There is no open wounds. The pulses are present. He does not have any cracks or any other drainage between his toes. The right lower extremity also reveals pitting edema to the level of the machines.  Neurologic: Alert and oriented x3. No obvious neurologic deficits.  Assessment & Plan:  I have discussed my assessment and plan  with Dr. Lynnae January as detailed under problem based charting.

## 2013-01-23 NOTE — Patient Instructions (Signed)
Please stop taking Clindamycin Please start taking Furosemide every morning  Please come back in one week for follow up

## 2013-01-23 NOTE — Assessment & Plan Note (Addendum)
The most likely etiology for this lower extremity edema is venous insufficiency in the setting of remote history of trauma to this left leg , which could be contributing. For several years the patient was on furosemide, which controlled his bilateral edema. This medication was discontinued in Jan 2014 due to nocturia. The likelihood cellulitis is low given no improvement with clindamycin. In addition, he has gained 11 pounds over the last 8 months coinciding with the discontinuation of furosemide.  Plan. -Discontinued clindamycin (not likely benefit and a drug reaction) -Restart furosemide 20 mg daily - emphasized to the patient to take it in the morning  -Continue with limb elevation. -Followup in one week. -If the swelling does not improved with furosemide, will consider referring the patient to venous insufficiency clinic

## 2013-01-24 NOTE — Progress Notes (Signed)
I saw and evaluated the patient.  I personally confirmed the key portions of the history and exam documented by Dr. Kazibwe and I reviewed pertinent patient test results.  The assessment, diagnosis, and plan were formulated together and I agree with the documentation in the resident's note. 

## 2013-01-25 ENCOUNTER — Telehealth: Payer: Self-pay | Admitting: *Deleted

## 2013-01-25 ENCOUNTER — Ambulatory Visit (INDEPENDENT_AMBULATORY_CARE_PROVIDER_SITE_OTHER): Payer: Medicare Other | Admitting: Internal Medicine

## 2013-01-25 VITALS — BP 134/72 | HR 87 | Temp 97.0°F | Wt 201.5 lb

## 2013-01-25 DIAGNOSIS — M7989 Other specified soft tissue disorders: Secondary | ICD-10-CM

## 2013-01-25 DIAGNOSIS — L270 Generalized skin eruption due to drugs and medicaments taken internally: Secondary | ICD-10-CM

## 2013-01-25 MED ORDER — FUROSEMIDE 20 MG PO TABS: 40.0000 mg | ORAL_TABLET | Freq: Two times a day (BID) | ORAL | Status: DC

## 2013-01-25 NOTE — Telephone Encounter (Signed)
Nurse from [facility] ([place] called and states past 2 days open draining wounds left leg. Areas are swollen and red. Appt given 3PM Dr Gordy Levan. Hilda Blades Kosei Rhodes RN 01/25/13 1:50PM

## 2013-01-25 NOTE — Patient Instructions (Addendum)
Return to clinic in 2 weeks.  1. We will increase your dose of furosemide to 40mg  twice a day.   2. Please try to keep your leg elevated; this will help the swelling 3. We will check a BMET in 2 weeks to monitor your electrolytes  4. You do not have an infection in your left leg

## 2013-01-25 NOTE — Progress Notes (Signed)
Patient ID: Zachary [name], male   DOB: 1929/06/28, 79 y.o.   MRN: [id number]           HPI: Zachary [name] is a 79 y.o. CM with a PMH of HTN, BPH, squamous cell carcinoma in situ, basal cell carcinoma, and swelling of the left lower extremity.  He presents for an acute visit today for left lower extremity swelling for the past couple of weeks which was initially thought to be cellulitis due to chronic venous insufficiency.  He was started on clindamycin after DVT was ruled out, but developed a rash and was taken off clindamycin after about a week.  He was then put on lasix 20mg  which has not improved the swelling much.  Today, a nurse from his assisted living called and stated the leg has an open wound and has been draining for the past 2 days.  He reports some subjective fevers and chills, but denies any N/V.  He reports that there is less pain today than there has been.  Records reveal a gain of 11 pounds since his furosemide was stopped 8 months ago.     Past Medical History  Diagnosis Date  . Chronic pain disorder 06/22/2006  . Depression 06/22/2006  . Anxiety 06/22/2006  . Essential hypertension 06/22/2006  . Gastroesophageal reflux disease with stricture 06/22/2006    With esophagitis, requiring dilatation 08/02/2006   . Diverticulosis of colon 06/22/2006  . Benign prostatic hypertrophy with urinary obstruction 06/22/2006  . Arthritis of hip 05/21/2010    s/p left hip total arthroplasty   . Constipation due to pain medication 06/03/2011  . Gastritis 07/06/2012    Seen on EGD 08/02/2006   . Obesity (BMI 30.0-34.9) 07/06/2012  . Facial basal cell cancer 06/22/2006  . Squamous cell skin cancer 07/06/2012    In situ   . Irritable bowel syndrome   . Alcoholism     Remote  . Cataract    Current Outpatient Prescriptions  Medication Sig Dispense Refill  . amLODipine (NORVASC) 10 MG tablet Take 1 tablet (10 mg total) by mouth daily.  30 tablet  11  . artificial tears (LACRILUBE) OINT ophthalmic  ointment Use as directed in left eye at bedtime  3.5 g  6  . aspirin 81 MG EC tablet Take 1 tablet (81 mg total) by mouth daily. Swallow whole.  30 tablet  12  . feeding supplement (ENSURE IMMUNE HEALTH) LIQD Take 237 mLs by mouth 3 (three) times daily with meals.  5688 mL  11  . finasteride (PROSCAR) 5 MG tablet Take 1 tablet (5 mg total) by mouth daily.  30 tablet  11  . furosemide (LASIX) 20 MG tablet Take 2 tablets (40 mg total) by mouth 2 (two) times daily.  30 tablet  3  . LORazepam (ATIVAN) 1 MG tablet Take 1 tablet (1 mg total) by mouth every 12 (twelve) hours. In applesauce, dosing to be supervised by caregiver  60 tablet  5  . methadone (DOLOPHINE) 5 MG tablet Take 1 tablet (5 mg total) by mouth every 8 (eight) hours.  90 tablet  0  . Multiple Vitamin (TAB-A-VITE) TABS Take 1 tablet by mouth daily.  30 tablet  11  . mupirocin (BACTROBAN) 2 % ointment       . omeprazole (PRILOSEC) 20 MG capsule Take 1 capsule (20 mg total) by mouth daily.  30 capsule  6  . PARoxetine (PAXIL) 40 MG tablet Take 1 tablet (40 mg total) by mouth daily.  Please note change in tablet.  Take just one daily from now on.  30 tablet  11  . polyethylene glycol powder (MIRALAX) powder Take 17 g by mouth daily as needed. Use as directed.  527 g  11  . protein supplement (PROMOD) POWD Take 24gm by mouth 3(three) times daily with meals.  454 g  11  . senna-docusate (SENOKOT-S) 8.6-50 MG per tablet Take 4 tablets by mouth at bedtime.  120 tablet  11  . Tamsulosin HCl (FLOMAX) 0.4 MG CAPS Take 1 capsule (0.4 mg total) by mouth daily.  30 capsule  11  . tobramycin-dexamethasone (TOBRADEX) ophthalmic solution       . traMADol (ULTRAM) 50 MG tablet Take 1 tablet (50 mg total) by mouth every 6 (six) hours as needed for pain.  120 tablet  5  . triamcinolone (KENALOG) 0.025 % cream        No current facility-administered medications for this visit.   Family History  Problem Relation Age of Onset  . Pneumonia Mother 26  .  Heart attack Father 54  . Early death Brother     Specifics unknown  . Healthy Daughter   . Healthy Son   . Heart disease Brother     Specifics unknown  . Pancreatic cancer Sister   . Early death Sister 3    Hit by truck on [address]  . Healthy Daughter    History   Social History  . Marital Status: Widowed    Spouse Name: N/A    Number of Children: N/A  . Years of Education: N/A   Social History Main Topics  . Smoking status: Former Smoker -- 1.00 packs/day for 25 years    Quit date: 06/20/1978  . Smokeless tobacco: Never Used  . Alcohol Use: No  . Drug Use: No  . Sexually Active: No   Other Topics Concern  . Not on file   Social History Narrative   Zachary [name] has been divorced for decades. He has 2 adult children probably in [place] with no contact.  Had a sig other who died years.  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  Maintains some contact with his sister and some nephews, but seems to have a lonely existence.  He lives in an assisting living facility in [place].  History has included smoking and periods of drinking (details of this are unclear)    Review of Systems: Constitutional: Denies fever, chills, diaphoresis, appetite change and fatigue.  Respiratory: Denies SOB, DOE, cough, chest tightness, and wheezing.  Cardiovascular: No chest pain or palpitations. Gastrointestinal: No abdominal pain, nausea, vomiting, bloody stools Genitourinary: No dysuria, frequency, hematuria, or flank pain.  Musculoskeletal: No myalgias, back pain, joint swelling, arthralgias    Objective:  Physical Exam: Filed Vitals:   01/25/13 1510  BP: 134/72  Pulse: 87  Temp: 97 F (36.1 C)  TempSrc: Oral  Weight: 201 lb 8 oz (91.4 kg)  SpO2: 94%   General: Well nourished. No acute distress.  Lungs: CTA bilaterally. Heart: RRR; no extra sounds or murmurs  Abdomen: Non-distended, normal BS,  soft, nontender; no hepatosplenomegaly  Extremities: Significant scaling and erythema of the lower extremities bilaterally.  Increased erythema and swelling of the left lower extremity with an area of skin breakdown and minimal serous drainage.   Neurologic: Alert and oriented x3. No obvious neurologic deficits.  Assessment & Plan:  I  have discussed my assessment and plan with Dr. Stann Mainland as detailed under problem based charting.

## 2013-01-25 NOTE — Assessment & Plan Note (Addendum)
Pt returns today after a nurse from his assisted living called and stated the left leg has an open wound and has been draining for the past 2 days.  On exam, their is an open wound but minimal serous drainage.  There is erythema and scaling of both lower extremities.  Lower extremity dopplers were negative for DVT.    -increase dose of furosemide to 40mg  twice daily -elevate the leg when possible -see pt back in 2 weeks for recheck and BMET -included comment in pt instructions that he is not infectious to other residents of his assisted living facility

## 2013-01-28 ENCOUNTER — Telehealth: Payer: Self-pay | Admitting: *Deleted

## 2013-01-28 NOTE — Telephone Encounter (Signed)
I talked with Dr Gordy Levan and she approves start of Care for nursing. Nurse will assess legs tomorrow and call in her recommendations for care.

## 2013-01-28 NOTE — Progress Notes (Signed)
I saw and evaluated the patient.  I personally confirmed the key portions of the history and exam documented by Dr. Gill and I reviewed pertinent patient test results.  The assessment, diagnosis, and plan were formulated together and I agree with the documentation in the resident's note. 

## 2013-01-28 NOTE — Telephone Encounter (Signed)
Home Health Nurse called stating pt was seen in  clinic on Friday for Swelling of legs. There were not instructions of type of dressing for this pt.  She will need an order for Start of Care (Nursing) and type of Wound care needed.  Mickel Baas, RN  # [phone]

## 2013-01-29 NOTE — Telephone Encounter (Signed)
Received call from [facility] with [facility].  She reports that pt has refused nursing services and dressing changes

## 2013-01-31 ENCOUNTER — Encounter: Payer: Self-pay | Admitting: Internal Medicine

## 2013-01-31 ENCOUNTER — Ambulatory Visit (INDEPENDENT_AMBULATORY_CARE_PROVIDER_SITE_OTHER): Payer: Medicare Other | Admitting: Internal Medicine

## 2013-01-31 VITALS — BP 125/71 | HR 75 | Temp 97.2°F | Ht 66.75 in | Wt 194.3 lb

## 2013-01-31 DIAGNOSIS — M7989 Other specified soft tissue disorders: Secondary | ICD-10-CM

## 2013-01-31 DIAGNOSIS — L270 Generalized skin eruption due to drugs and medicaments taken internally: Secondary | ICD-10-CM

## 2013-01-31 LAB — BASIC METABOLIC PANEL WITH GFR
BUN: 34 mg/dL — ABNORMAL HIGH (ref 6–23)
Chloride: 96 mEq/L (ref 96–112)
Creat: 1.69 mg/dL — ABNORMAL HIGH (ref 0.50–1.35)
GFR, Est Non African American: 37 mL/min — ABNORMAL LOW
Glucose, Bld: 99 mg/dL (ref 70–99)

## 2013-01-31 NOTE — Telephone Encounter (Signed)
Pt presents today for scheduled [facility] visit.  Pt states Zachary [name] has been trying to make contact, however, pt is unaware of need for services.  CSW discussed with Dr. Alice Rieger as pt has appt with physician today.  Pt has no dressing changes and there is no need for home health RN at this time.  Pt notified.  Zachary [name] stating he is willing for [facility] but was unaware of the need.  Pt would like CSW to notify Zachary [name], Therapist, sports at [facility] regarding status of need for [facility].  CSW placed call and left message notify [facility], pt has no need for [facility] RN at this time.

## 2013-01-31 NOTE — Progress Notes (Signed)
Patient ID: Zachary [name], male   DOB: February 05, 1930, 79 y.o.   MRN: [id number]   Subjective:   HPI: [name] is a 79 y.o. history as listed below presents for followup visit for his left lower extremity edema. His Lasix dose was increased from 20 mg daily to 40 mg twice a day last week and reports compliance.  Reports that his notes significant improvement in his lower extremity edema. He has questions regarding to home health services that were ordered on last visit. He feels that he does not need this service. He has been elevating his extremity as advised.  Otherwise, denies symptoms of fevers, chills, rigors, fatigue, nausea, or vomiting. No new complaints. The focus of the visit has been on the leg swelling    Past Medical History  Diagnosis Date  . Chronic pain disorder 06/22/2006  . Depression 06/22/2006  . Anxiety 06/22/2006  . Essential hypertension 06/22/2006  . Gastroesophageal reflux disease with stricture 06/22/2006    With esophagitis, requiring dilatation 08/02/2006   . Diverticulosis of colon 06/22/2006  . Benign prostatic hypertrophy with urinary obstruction 06/22/2006  . Arthritis of hip 05/21/2010    s/p left hip total arthroplasty   . Constipation due to pain medication 06/03/2011  . Gastritis 07/06/2012    Seen on EGD 08/02/2006   . Obesity (BMI 30.0-34.9) 07/06/2012  . Facial basal cell cancer 06/22/2006  . Squamous cell skin cancer 07/06/2012    In situ   . Irritable bowel syndrome   . Alcoholism     Remote  . Cataract    Current Outpatient Prescriptions  Medication Sig Dispense Refill  . amLODipine (NORVASC) 10 MG tablet Take 1 tablet (10 mg total) by mouth daily.  30 tablet  11  . artificial tears (LACRILUBE) OINT ophthalmic ointment Use as directed in left eye at bedtime  3.5 g  6  . feeding supplement (ENSURE IMMUNE HEALTH) LIQD Take 237 mLs by mouth 3 (three) times daily with meals.  5688 mL  11  . finasteride (PROSCAR) 5 MG tablet Take 1 tablet (5 mg total) by  mouth daily.  30 tablet  11  . furosemide (LASIX) 20 MG tablet Take 2 tablets (40 mg total) by mouth 2 (two) times daily.  30 tablet  3  . LORazepam (ATIVAN) 1 MG tablet Take 1 tablet (1 mg total) by mouth every 12 (twelve) hours. In applesauce, dosing to be supervised by caregiver  60 tablet  5  . methadone (DOLOPHINE) 5 MG tablet Take 1 tablet (5 mg total) by mouth every 8 (eight) hours.  90 tablet  0  . Multiple Vitamin (TAB-A-VITE) TABS Take 1 tablet by mouth daily.  30 tablet  11  . mupirocin (BACTROBAN) 2 % ointment       . omeprazole (PRILOSEC) 20 MG capsule Take 1 capsule (20 mg total) by mouth daily.  30 capsule  6  . PARoxetine (PAXIL) 40 MG tablet Take 1 tablet (40 mg total) by mouth daily. Please note change in tablet.  Take just one daily from now on.  30 tablet  11  . polyethylene glycol powder (MIRALAX) powder Take 17 g by mouth daily as needed. Use as directed.  527 g  11  . protein supplement (PROMOD) POWD Take 24gm by mouth 3(three) times daily with meals.  454 g  11  . senna-docusate (SENOKOT-S) 8.6-50 MG per tablet Take 4 tablets by mouth at bedtime.  120 tablet  11  . Tamsulosin HCl (  FLOMAX) 0.4 MG CAPS Take 1 capsule (0.4 mg total) by mouth daily.  30 capsule  11  . tobramycin-dexamethasone (TOBRADEX) ophthalmic solution       . traMADol (ULTRAM) 50 MG tablet Take 1 tablet (50 mg total) by mouth every 6 (six) hours as needed for pain.  120 tablet  5  . triamcinolone (KENALOG) 0.025 % cream       . aspirin 81 MG EC tablet Take 1 tablet (81 mg total) by mouth daily. Swallow whole.  30 tablet  12   No current facility-administered medications for this visit.   Family History  Problem Relation Age of Onset  . Pneumonia Mother 71  . Heart attack Father 23  . Early death Brother     Specifics unknown  . Healthy Daughter   . Healthy Son   . Heart disease Brother     Specifics unknown  . Pancreatic cancer Sister   . Early death Sister 3    Hit by truck on [address]  .  Healthy Daughter    History   Social History  . Marital Status: Widowed    Spouse Name: N/A    Number of Children: N/A  . Years of Education: N/A   Social History Main Topics  . Smoking status: Former Smoker -- 1.00 packs/day for 25 years    Quit date: 06/20/1978  . Smokeless tobacco: Never Used  . Alcohol Use: No  . Drug Use: No  . Sexual Activity: No   Other Topics Concern  . None   Social History Narrative   [name] has been divorced for decades. He has 2 adult children probably in [place] with no contact.  Had a sig other who died years.  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  Maintains some contact with his sister and some nephews, but seems to have a lonely existence.  He lives in an assisting living facility in [place].  History has included smoking and periods of drinking (details of this are unclear)   Review of Systems: Constitutional: Denies fever, chills, diaphoresis, appetite change and fatigue.  Respiratory: Denies SOB, DOE, cough, chest tightness, and wheezing.  Cardiovascular: No chest pain, palpitations Gastrointestinal: No abdominal pain, nausea, vomiting, bloody stools Genitourinary: No dysuria, frequency, hematuria, or flank pain.  Musculoskeletal: No myalgias, back pain, joint swelling, arthralgias    Objective:  Physical Exam: Filed Vitals:   01/31/13 1038  BP: 125/71  Pulse: 75  Temp: 97.2 F (36.2 C)  TempSrc: Oral  Height: 5' 6.75" (1.695 m)  Weight: 194 lb 4.8 oz (88.134 kg)  SpO2: 95%   General: Well nourished. No acute distress.  Lungs: CTA bilaterally. Heart: RRR; no extra sounds or murmurs  Abdomen: Non-distended, normal BS, soft, nontender; no hepatosplenomegaly  Extremities: The LLE has improved significantly since I last saw him 2 weeks ago. No more draining. No wounds noticed. No joint swelling or tenderness. Neurologic: Alert and oriented  x3. No obvious neurologic deficits.  Assessment & Plan:  I have discussed my assessment and plan  with Dr. Marinda Elk as detailed under problem based charting.

## 2013-01-31 NOTE — Patient Instructions (Signed)
Please continue with Lasix at 40 mg twice a day until I see you again in one week We will do a blood check today If you notice symptoms of dehydration as below, please reduce the dose of lasix to 40 mg once daily You do not require home health services at this time.    Dehydration, Adult Dehydration means your body does not have as much fluid as it needs. Your kidneys, brain, and heart will not work properly without the right amount of fluids and salt.  HOME CARE  Ask your doctor how to replace body fluid losses (rehydrate).  Drink enough fluids to keep your pee (urine) clear or pale yellow.  Drink small amounts of fluids often if you feel sick to your stomach (nauseous) or throw up (vomit).  Eat like you normally do.  Avoid:  Foods or drinks high in sugar.  Bubbly (carbonated) drinks.  Juice.  Very hot or cold fluids.  Drinks with caffeine.  Fatty, greasy foods.  Alcohol.  Tobacco.  Eating too much.  Gelatin desserts.  Wash your hands to avoid spreading germs (bacteria, viruses).  Only take medicine as told by your doctor.  Keep all doctor visits as told. GET HELP RIGHT AWAY IF:   You cannot drink something without throwing up.  You get worse even with treatment.  Your vomit has blood in it or looks greenish.  Your poop (stool) has blood in it or looks black and tarry.  You have not peed in 6 to 8 hours.  You pee a small amount of very dark pee.  You have a fever.  You pass out (faint).  You have belly (abdominal) pain that gets worse or stays in one spot (localizes).  You have a rash, stiff neck, or bad headache.  You get easily annoyed, sleepy, or are hard to wake up.  You feel weak, dizzy, or very thirsty. MAKE SURE YOU:   Understand these instructions.  Will watch your condition.  Will get help right away if you are not doing well or get worse. Document Released: 04/02/2009 Document Revised: 08/29/2011 Document Reviewed:  01/24/2011 [facility] Patient Information 2014 [place], [place].

## 2013-01-31 NOTE — Assessment & Plan Note (Addendum)
The leg swelling has significantly improved. His weight is down by 7 pounds. Physical exam raises no concern for cellulitis.  I have recommended that he continues with 40 mg of Lasix twice a day for the next one week. I'll reevaluate him within one week. Will order at [facility] today. During his followup visit, his Lasix dose can be reduced to maintenance of between 20-40 mg daily. Discontinued Home health services. \   Addendum: 02/01/2013 7:09 PM Noted results from BMP with increase in creatine and BUN  Recent Labs Lab 01/31/13 1112  NA 136  K 4.1  CL 96  CO2 32  GLUCOSE 99  BUN 34*  CREATININE 1.69*  CALCIUM 9.4   Plan  . Called the facility and advised to faxed order.  - will reduce lasix to from 40 mg bid to 20 mg daily.  - I will see patient on Friday.

## 2013-02-01 MED ORDER — FUROSEMIDE 20 MG PO TABS: 20.0000 mg | ORAL_TABLET | Freq: Every day | ORAL | Status: DC

## 2013-02-01 NOTE — Addendum Note (Signed)
Addended by: Jessee Avers on: 02/01/2013 07:12 PM   Modules accepted: Orders

## 2013-02-07 ENCOUNTER — Ambulatory Visit (INDEPENDENT_AMBULATORY_CARE_PROVIDER_SITE_OTHER): Payer: Medicare Other | Admitting: Internal Medicine

## 2013-02-07 ENCOUNTER — Encounter: Payer: Self-pay | Admitting: Internal Medicine

## 2013-02-07 VITALS — BP 134/75 | HR 71 | Temp 97.5°F | Ht 67.0 in | Wt 196.2 lb

## 2013-02-07 DIAGNOSIS — G894 Chronic pain syndrome: Secondary | ICD-10-CM

## 2013-02-07 DIAGNOSIS — M7989 Other specified soft tissue disorders: Secondary | ICD-10-CM

## 2013-02-07 DIAGNOSIS — L270 Generalized skin eruption due to drugs and medicaments taken internally: Secondary | ICD-10-CM

## 2013-02-07 LAB — BASIC METABOLIC PANEL WITH GFR
Chloride: 102 mEq/L (ref 96–112)
GFR, Est African American: 57 mL/min — ABNORMAL LOW
GFR, Est Non African American: 49 mL/min — ABNORMAL LOW
Potassium: 4.3 mEq/L (ref 3.5–5.3)
Sodium: 138 mEq/L (ref 135–145)

## 2013-02-07 MED ORDER — FUROSEMIDE 20 MG PO TABS: 40.0000 mg | ORAL_TABLET | Freq: Every day | ORAL | Status: DC

## 2013-02-07 NOTE — Assessment & Plan Note (Signed)
The lasixs appears to have helped him. I will increase it to 40 mg daily. I will order a BMPHe will follow up in one week.

## 2013-02-07 NOTE — Progress Notes (Signed)
Patient ID: Zachary [name], male   DOB: 1929/10/02, 79 y.o.   MRN: [id number]  Subjective:   HPI: Zachary [name] is a 79 y.o. history as listed below presents for followup visit for his left lower extremity edema. His Lasix dose was reduced from 40 mg bid to 20 mg daily after a bump in his creatine and BUN.  He reports compliance with this but his leg edema has not completely returned to normal. He has been elevating his extremity as advised.  In addition, the patient continues to complain of generalized body pain, mainly in his pain, and bilaterally in his thighs. He is currently on tramadol, and methadone for this chronic pain management his PCP, Dr.Klima. He denies fatigue, fever, nausea or vomiting.    Past Medical History  Diagnosis Date  . Chronic pain disorder 06/22/2006  . Depression 06/22/2006  . Anxiety 06/22/2006  . Essential hypertension 06/22/2006  . Gastroesophageal reflux disease with stricture 06/22/2006    With esophagitis, requiring dilatation 08/02/2006   . Diverticulosis of colon 06/22/2006  . Benign prostatic hypertrophy with urinary obstruction 06/22/2006  . Arthritis of hip 05/21/2010    s/p left hip total arthroplasty   . Constipation due to pain medication 06/03/2011  . Gastritis 07/06/2012    Seen on EGD 08/02/2006   . Obesity (BMI 30.0-34.9) 07/06/2012  . Facial basal cell cancer 06/22/2006  . Squamous cell skin cancer 07/06/2012    In situ   . Irritable bowel syndrome   . Alcoholism     Remote  . Cataract    Current Outpatient Prescriptions  Medication Sig Dispense Refill  . amLODipine (NORVASC) 10 MG tablet Take 1 tablet (10 mg total) by mouth daily.  30 tablet  11  . artificial tears (LACRILUBE) OINT ophthalmic ointment Use as directed in left eye at bedtime  3.5 g  6  . feeding supplement (ENSURE IMMUNE HEALTH) LIQD Take 237 mLs by mouth 3 (three) times daily with meals.  5688 mL  11  . finasteride (PROSCAR) 5 MG tablet Take 1 tablet (5 mg total) by mouth daily.  30  tablet  11  . furosemide (LASIX) 20 MG tablet Take 2 tablets (40 mg total) by mouth daily.  30 tablet  0  . LORazepam (ATIVAN) 1 MG tablet Take 1 tablet (1 mg total) by mouth every 12 (twelve) hours. In applesauce, dosing to be supervised by caregiver  60 tablet  5  . methadone (DOLOPHINE) 5 MG tablet Take 1 tablet (5 mg total) by mouth every 8 (eight) hours.  90 tablet  0  . Multiple Vitamin (TAB-A-VITE) TABS Take 1 tablet by mouth daily.  30 tablet  11  . mupirocin (BACTROBAN) 2 % ointment       . omeprazole (PRILOSEC) 20 MG capsule Take 1 capsule (20 mg total) by mouth daily.  30 capsule  6  . PARoxetine (PAXIL) 40 MG tablet Take 1 tablet (40 mg total) by mouth daily. Please note change in tablet.  Take just one daily from now on.  30 tablet  11  . polyethylene glycol powder (MIRALAX) powder Take 17 g by mouth daily as needed. Use as directed.  527 g  11  . protein supplement (PROMOD) POWD Take 24gm by mouth 3(three) times daily with meals.  454 g  11  . senna-docusate (SENOKOT-S) 8.6-50 MG per tablet Take 4 tablets by mouth at bedtime.  120 tablet  11  . Tamsulosin HCl (FLOMAX) 0.4 MG CAPS Take  1 capsule (0.4 mg total) by mouth daily.  30 capsule  11  . tobramycin-dexamethasone (TOBRADEX) ophthalmic solution       . traMADol (ULTRAM) 50 MG tablet Take 1 tablet (50 mg total) by mouth every 6 (six) hours as needed for pain.  120 tablet  5  . triamcinolone (KENALOG) 0.025 % cream       . aspirin 81 MG EC tablet Take 1 tablet (81 mg total) by mouth daily. Swallow whole.  30 tablet  12   No current facility-administered medications for this visit.   Family History  Problem Relation Age of Onset  . Pneumonia Mother 3  . Heart attack Father 11  . Early death Brother     Specifics unknown  . Healthy Daughter   . Healthy Son   . Heart disease Brother     Specifics unknown  . Pancreatic cancer Sister   . Early death Sister 3    Hit by truck on [address]  . Healthy Daughter    History    Social History  . Marital Status: Widowed    Spouse Name: N/A    Number of Children: N/A  . Years of Education: N/A   Social History Main Topics  . Smoking status: Former Smoker -- 1.00 packs/day for 25 years    Quit date: 06/20/1978  . Smokeless tobacco: Never Used  . Alcohol Use: No  . Drug Use: No  . Sexual Activity: None   Other Topics Concern  . None   Social History Narrative   Zachary [name] has been divorced for decades. He has 2 adult children probably in [place] with no contact.  Had a sig other who died years.  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  Maintains some contact with his sister and some nephews, but seems to have a lonely existence.  He lives in an assisting living facility in [place].  History has included smoking and periods of drinking (details of this are unclear)   Review of Systems: Constitutional: Denies fever, chills, diaphoresis, appetite change and fatigue.  Respiratory: Denies SOB, DOE, cough, chest tightness, and wheezing.  Cardiovascular: No chest pain, palpitations Gastrointestinal: No abdominal pain, nausea, vomiting, bloody stools Genitourinary: No dysuria, frequency, hematuria, or flank pain.  Musculoskeletal: No myalgias, back pain, joint swelling, arthralgias    Objective:  Physical Exam: Filed Vitals:   02/07/13 1042  BP: 134/75  Pulse: 71  Temp: 97.5 F (36.4 C)  TempSrc: Oral  Height: 5\' 7"  (1.702 m)  Weight: 196 lb 3.2 oz (88.996 kg)  SpO2: 97%   General: Well nourished. No acute distress.  Lungs: CTA bilaterally. Heart: RRR; no extra sounds or murmurs  Abdomen: Non-distended, normal BS, soft, nontender; no hepatosplenomegaly  Extremities: The LLE appears the same compared to last week. No more draining. No wounds noticed. No joint swelling or tenderness. Neurologic: Alert and oriented x3. No obvious neurologic deficits.  Assessment &  Plan:  I have discussed my assessment and plan  with Dr. Eppie Gibson as detailed under problem based charting.

## 2013-02-07 NOTE — Patient Instructions (Addendum)
Please increase Lasix to 40 mg ONCE a day  Please continue to elevate your legs as before  We will check your blood today and I will call you if there is concern Please come back to seem me in two weeks

## 2013-02-07 NOTE — Assessment & Plan Note (Signed)
Discussed with Dr Eppie Gibson who is more familiar with the patient and his pain complaints. He will be evaluated for this on next visit.

## 2013-02-10 NOTE — Progress Notes (Signed)
I saw and evaluated the patient. I personally confirmed the key portions of Dr. Arsenio Katz history and exam and reviewed pertinent patient test results. The assessment, diagnosis, and plan were formulated together and I agree with the documentation in the resident's note.

## 2013-02-21 ENCOUNTER — Ambulatory Visit (INDEPENDENT_AMBULATORY_CARE_PROVIDER_SITE_OTHER): Payer: Medicare Other | Admitting: Internal Medicine

## 2013-02-21 ENCOUNTER — Encounter: Payer: Self-pay | Admitting: Internal Medicine

## 2013-02-21 VITALS — BP 122/68 | HR 67 | Temp 97.3°F | Ht 67.0 in | Wt 197.5 lb

## 2013-02-21 DIAGNOSIS — M7989 Other specified soft tissue disorders: Secondary | ICD-10-CM

## 2013-02-21 LAB — BASIC METABOLIC PANEL WITH GFR
CO2: 30 mEq/L (ref 19–32)
Chloride: 100 mEq/L (ref 96–112)
GFR, Est Non African American: 52 mL/min — ABNORMAL LOW
Potassium: 4.1 mEq/L (ref 3.5–5.3)
Sodium: 137 mEq/L (ref 135–145)

## 2013-02-21 MED ORDER — FUROSEMIDE 20 MG PO TABS: 40.0000 mg | ORAL_TABLET | Freq: Every day | ORAL | Status: DC

## 2013-02-21 NOTE — Patient Instructions (Signed)
Please keep on Lasix 40 mg once daily Keep raising your feet up to reduce the edema  I will give a call regarding your results if there are of concern  Otherwise, follow up with Dr Eppie Gibson on 9/18.

## 2013-02-21 NOTE — Assessment & Plan Note (Addendum)
LLE edema is stable though his weight is up by 1.3 pound. I will continue with current dose of Lasix 40 mg once daily. I will order a BMP today. I will contact the patient. If the results of concern. Encouraged him to continue elevating his leg as before. He will followup with Dr. Eppie Gibson on 9/18.

## 2013-02-21 NOTE — Progress Notes (Signed)
Patient ID: Zachary [name], male   DOB: 04/06/1930, 79 y.o.   MRN: [id number]  Subjective:   HPI: Zachary.Zachary [name] is a 79 y.o. history as listed below presents for followup visit for his left lower extremity edema. He takes Lasix 40 mg daily. He reports compliance with this but his leg edema. No change in his edema today. He has been elevating his extremity as advised.  No new complaints. Weight is 197.5 pounds from 196.2 pounds on 02/07/2013.   Past Medical History  Diagnosis Date  . Chronic pain disorder 06/22/2006  . Depression 06/22/2006  . Anxiety 06/22/2006  . Essential hypertension 06/22/2006  . Gastroesophageal reflux disease with stricture 06/22/2006    With esophagitis, requiring dilatation 08/02/2006   . Diverticulosis of colon 06/22/2006  . Benign prostatic hypertrophy with urinary obstruction 06/22/2006  . Arthritis of hip 05/21/2010    s/p left hip total arthroplasty   . Constipation due to pain medication 06/03/2011  . Gastritis 07/06/2012    Seen on EGD 08/02/2006   . Obesity (BMI 30.0-34.9) 07/06/2012  . Facial basal cell cancer 06/22/2006  . Squamous cell skin cancer 07/06/2012    In situ   . Irritable bowel syndrome   . Alcoholism     Remote  . Cataract    Current Outpatient Prescriptions  Medication Sig Dispense Refill  . amLODipine (NORVASC) 10 MG tablet Take 1 tablet (10 mg total) by mouth daily.  30 tablet  11  . artificial tears (LACRILUBE) OINT ophthalmic ointment Use as directed in left eye at bedtime  3.5 g  6  . feeding supplement (ENSURE IMMUNE HEALTH) LIQD Take 237 mLs by mouth 3 (three) times daily with meals.  5688 mL  11  . finasteride (PROSCAR) 5 MG tablet Take 1 tablet (5 mg total) by mouth daily.  30 tablet  11  . furosemide (LASIX) 20 MG tablet Take 2 tablets (40 mg total) by mouth daily.  30 tablet  2  . LORazepam (ATIVAN) 1 MG tablet Take 1 tablet (1 mg total) by mouth every 12 (twelve) hours. In applesauce, dosing to be supervised by caregiver  60 tablet  5   . methadone (DOLOPHINE) 5 MG tablet Take 1 tablet (5 mg total) by mouth every 8 (eight) hours.  90 tablet  0  . Multiple Vitamin (TAB-A-VITE) TABS Take 1 tablet by mouth daily.  30 tablet  11  . mupirocin (BACTROBAN) 2 % ointment       . omeprazole (PRILOSEC) 20 MG capsule Take 1 capsule (20 mg total) by mouth daily.  30 capsule  6  . PARoxetine (PAXIL) 40 MG tablet Take 1 tablet (40 mg total) by mouth daily. Please note change in tablet.  Take just one daily from now on.  30 tablet  11  . polyethylene glycol powder (MIRALAX) powder Take 17 g by mouth daily as needed. Use as directed.  527 g  11  . protein supplement (PROMOD) POWD Take 24gm by mouth 3(three) times daily with meals.  454 g  11  . senna-docusate (SENOKOT-S) 8.6-50 MG per tablet Take 4 tablets by mouth at bedtime.  120 tablet  11  . Tamsulosin HCl (FLOMAX) 0.4 MG CAPS Take 1 capsule (0.4 mg total) by mouth daily.  30 capsule  11  . tobramycin-dexamethasone (TOBRADEX) ophthalmic solution       . traMADol (ULTRAM) 50 MG tablet Take 1 tablet (50 mg total) by mouth every 6 (six) hours as needed for pain.  120 tablet  5  . triamcinolone (KENALOG) 0.025 % cream       . aspirin 81 MG EC tablet Take 1 tablet (81 mg total) by mouth daily. Swallow whole.  30 tablet  12   No current facility-administered medications for this visit.   Family History  Problem Relation Age of Onset  . Pneumonia Mother 80  . Heart attack Father 27  . Early death Brother     Specifics unknown  . Healthy Daughter   . Healthy Son   . Heart disease Brother     Specifics unknown  . Pancreatic cancer Sister   . Early death Sister 3    Hit by truck on [address]  . Healthy Daughter    History   Social History  . Marital Status: Widowed    Spouse Name: N/A    Number of Children: N/A  . Years of Education: N/A   Social History Main Topics  . Smoking status: Former Smoker -- 1.00 packs/day for 25 years    Quit date: 06/20/1978  . Smokeless tobacco:  Never Used  . Alcohol Use: No  . Drug Use: No  . Sexual Activity: None   Other Topics Concern  . None   Social History Narrative   Zachary [name] has been divorced for decades. He has 2 adult children probably in [place] with no contact.  Had a sig other who died years.  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  Maintains some contact with his sister and some nephews, but seems to have a lonely existence.  He lives in an assisting living facility in [place].  History has included smoking and periods of drinking (details of this are unclear)   Review of Systems: Constitutional: Denies fever, chills, diaphoresis, appetite change and fatigue.  Respiratory: Denies SOB, DOE, cough, chest tightness, and wheezing.  Cardiovascular: No chest pain, palpitations Gastrointestinal: No abdominal pain, nausea, vomiting, bloody stools Genitourinary: No dysuria, frequency, hematuria, or flank pain.  Musculoskeletal: No myalgias, back pain, joint swelling, arthralgias    Objective:  Physical Exam: Filed Vitals:   02/21/13 1050  BP: 122/68  Pulse: 67  Temp: 97.3 F (36.3 C)  TempSrc: Oral  Height: 5\' 7"  (1.702 m)  Weight: 197 lb 8 oz (89.585 kg)  SpO2: 94%   General: Well nourished. No acute distress.  Lungs: CTA bilaterally. Heart: RRR; no extra sounds or murmurs  Abdomen: Non-distended, normal BS, soft, nontender; no hepatosplenomegaly  Extremities: The LLE appears stable. No more draining. No wounds noticed. No joint swelling or tenderness. Pulses are present. Neurologic: Alert and oriented x3. No obvious neurologic deficits.  Assessment & Plan:  I have discussed my assessment and plan  with Dr. Marinda Elk as detailed under problem based charting.

## 2013-02-26 ENCOUNTER — Other Ambulatory Visit: Payer: Self-pay | Admitting: *Deleted

## 2013-02-26 DIAGNOSIS — G894 Chronic pain syndrome: Secondary | ICD-10-CM

## 2013-02-26 NOTE — Telephone Encounter (Signed)
Dr Eppie Gibson, do you want to go ahead and print Nov, Dec and jan scripts, [facility] filled Sept script today and they have Oct's but to stay on top of the game and just to give you time Nov, Dec and Jan could be done now, thanks, h.

## 2013-02-27 MED ORDER — METHADONE HCL 5 MG PO TABS: 5.0000 mg | ORAL_TABLET | Freq: Three times a day (TID) | ORAL | Status: DC

## 2013-03-07 ENCOUNTER — Ambulatory Visit (INDEPENDENT_AMBULATORY_CARE_PROVIDER_SITE_OTHER): Payer: Medicare Other | Admitting: Internal Medicine

## 2013-03-07 ENCOUNTER — Encounter: Payer: Self-pay | Admitting: Internal Medicine

## 2013-03-07 VITALS — BP 121/69 | HR 78 | Temp 97.8°F | Wt 198.2 lb

## 2013-03-07 DIAGNOSIS — H9311 Tinnitus, right ear: Secondary | ICD-10-CM | POA: Insufficient documentation

## 2013-03-07 DIAGNOSIS — Z0000 Encounter for general adult medical examination without abnormal findings: Secondary | ICD-10-CM

## 2013-03-07 DIAGNOSIS — N138 Other obstructive and reflux uropathy: Secondary | ICD-10-CM

## 2013-03-07 DIAGNOSIS — N139 Obstructive and reflux uropathy, unspecified: Secondary | ICD-10-CM

## 2013-03-07 DIAGNOSIS — M1712 Unilateral primary osteoarthritis, left knee: Secondary | ICD-10-CM

## 2013-03-07 DIAGNOSIS — G894 Chronic pain syndrome: Secondary | ICD-10-CM

## 2013-03-07 DIAGNOSIS — Z23 Encounter for immunization: Secondary | ICD-10-CM

## 2013-03-07 DIAGNOSIS — F419 Anxiety disorder, unspecified: Secondary | ICD-10-CM

## 2013-03-07 DIAGNOSIS — M7989 Other specified soft tissue disorders: Secondary | ICD-10-CM

## 2013-03-07 DIAGNOSIS — H9319 Tinnitus, unspecified ear: Secondary | ICD-10-CM

## 2013-03-07 DIAGNOSIS — F329 Major depressive disorder, single episode, unspecified: Secondary | ICD-10-CM

## 2013-03-07 DIAGNOSIS — N401 Enlarged prostate with lower urinary tract symptoms: Secondary | ICD-10-CM

## 2013-03-07 DIAGNOSIS — F411 Generalized anxiety disorder: Secondary | ICD-10-CM

## 2013-03-07 DIAGNOSIS — M1710 Unilateral primary osteoarthritis, unspecified knee: Secondary | ICD-10-CM

## 2013-03-07 HISTORY — DX: Unilateral primary osteoarthritis, left knee: M17.12

## 2013-03-07 MED ORDER — FUROSEMIDE 40 MG PO TABS: 40.0000 mg | ORAL_TABLET | Freq: Every day | ORAL | Status: DC

## 2013-03-07 NOTE — Assessment & Plan Note (Signed)
His lower extremity swelling is improved and symmetric at this time on the Lasix 40 mg by mouth daily. At the last visit his potassium was within the normal range. We will continue the Lasix at 40 mg daily.

## 2013-03-07 NOTE — Assessment & Plan Note (Signed)
He remains chronically depressed but prefers his Paxil. We will therefore continue at 40 mg by mouth daily.

## 2013-03-07 NOTE — Assessment & Plan Note (Signed)
He continues to have symptoms of urgency, frequency, and nocturia. He states he's been compliant with the tamsulosin and finasteride as best as he can determine. We will continue with this therapy in hopes that the finasteride still has some improvement to offer. We will reassess his urinary symptoms at the followup visit.

## 2013-03-07 NOTE — Patient Instructions (Addendum)
It was great to see you again.  1) When you get the back pain, see if it gets better with the ativan.  I think it is a muscle spasm and it should get better with the ativan.  2) Your left knee has arthritis in it. We will continue to watch it closely.  I will continue the tramadol and the methadone for the pain.  3) The ticking in your ear is tinnitus.  This is also something we will watch closely.  Usually nothing needs to be done about it.  4) Continue taking your medications as you are.  We will see you back in 3 months, sooner if necessary.

## 2013-03-07 NOTE — Assessment & Plan Note (Signed)
Examination reveals crepitus of the with flexion. Given his age and history of gastritis we would like to avoid NSAIDs. Will treat with the as needed tramadol and methadone. Will watch the weight closely and consider prescribing a diet in the future if the osteoarthritis persists.

## 2013-03-07 NOTE — Assessment & Plan Note (Signed)
He continues to have chronic diffuse pain of the abdomen back and extremities. It is stable on the daily tramadol and 3 times a day methadone. We will continue with this therapy.

## 2013-03-07 NOTE — Assessment & Plan Note (Signed)
His anxiety is subjectively at baseline. We will continue the lorazepam 1 mg by mouth twice daily and Paxil 40 mg by mouth daily.

## 2013-03-07 NOTE — Assessment & Plan Note (Signed)
Zachary [name] was interested in receiving the flu shot today and this was given to him in clinic.

## 2013-03-07 NOTE — Assessment & Plan Note (Signed)
He notes occasional subjective tinnitus in the right ear. Examination was unremarkable. Will follow clinically.

## 2013-03-07 NOTE — Progress Notes (Signed)
  Subjective:    Patient ID: Zachary [name], male    DOB: 1929-09-16, 79 y.o.   MRN: [id number]  HPI  Zachary [name] did not bring his medications to this visit and is not sure what he is taking as it is provided for him at the facility.  Thus, we were unable to perform medication reconciliation this morning.  Please see the A&P for the status of the pt's chronic medical problems.  Review of Systems  Constitutional: Negative for activity change, appetite change and unexpected weight change.  HENT: Positive for tinnitus. Negative for dental problem and ear discharge.   Eyes: Positive for redness and itching.  Respiratory: Negative for cough, chest tightness, shortness of breath and wheezing.   Cardiovascular: Positive for leg swelling. Negative for chest pain and palpitations.  Gastrointestinal: Negative for nausea, vomiting and abdominal distention.  Genitourinary: Positive for urgency, frequency and difficulty urinating.  Musculoskeletal: Positive for myalgias, back pain and arthralgias. Negative for joint swelling.  Skin: Positive for rash. Negative for wound.  Neurological: Negative for syncope and speech difficulty.  Psychiatric/Behavioral: Positive for dysphoric mood and decreased concentration.      Objective:   Physical Exam  Nursing note and vitals reviewed. Constitutional: He is oriented to person, place, and time. He appears well-developed and well-nourished. No distress.  HENT:  Head: Normocephalic and atraumatic.  Cardiovascular: Normal rate, regular rhythm and normal heart sounds.  Exam reveals no gallop and no friction rub.   No murmur heard. Pulmonary/Chest: Effort normal and breath sounds normal. No respiratory distress. He has no wheezes. He has no rales. He exhibits tenderness.  Abdominal: Soft. Bowel sounds are normal. He exhibits no distension. There is no tenderness. There is no rebound and no guarding.  Musculoskeletal: Normal range of motion. He exhibits edema. He  exhibits no tenderness.  Left knee crepitous w/o effusion or erythema  Neurological: He is alert and oriented to person, place, and time. He exhibits normal muscle tone.  Skin: Skin is warm and dry. He is not diaphoretic. There is erythema.  Slight erythema of the bilateral lower extremity with trace edema bilaterally and no open wounds or drainage  Psychiatric:  Affect blunted but at baseline      Assessment & Plan:   Please see problem oriented charting.

## 2013-03-08 ENCOUNTER — Encounter: Payer: Medicare Other | Admitting: Internal Medicine

## 2013-05-07 ENCOUNTER — Other Ambulatory Visit: Payer: Self-pay | Admitting: *Deleted

## 2013-05-07 DIAGNOSIS — F419 Anxiety disorder, unspecified: Secondary | ICD-10-CM

## 2013-05-08 MED ORDER — LORAZEPAM 1 MG PO TABS: 1.0000 mg | ORAL_TABLET | Freq: Two times a day (BID) | ORAL | Status: DC

## 2013-05-08 NOTE — Telephone Encounter (Signed)
Called to pharm 

## 2013-05-29 ENCOUNTER — Other Ambulatory Visit: Payer: Self-pay | Admitting: *Deleted

## 2013-05-29 DIAGNOSIS — G894 Chronic pain syndrome: Secondary | ICD-10-CM

## 2013-05-29 NOTE — Telephone Encounter (Signed)
Written prescriptions have been completed.

## 2013-05-29 NOTE — Telephone Encounter (Signed)
Need dec, jan and feb

## 2013-05-30 ENCOUNTER — Telehealth: Payer: Self-pay | Admitting: *Deleted

## 2013-05-30 NOTE — Telephone Encounter (Signed)
Methadone rxs x 3 (for Dec, Jan, Feb) faxed to [facility]; confirmed received faxs per Mitzi Hansen.

## 2013-07-24 ENCOUNTER — Other Ambulatory Visit: Payer: Self-pay | Admitting: *Deleted

## 2013-07-24 DIAGNOSIS — G894 Chronic pain syndrome: Secondary | ICD-10-CM

## 2013-07-24 NOTE — Telephone Encounter (Signed)
3 scripts please

## 2013-07-25 MED ORDER — METHADONE HCL 5 MG PO TABS: 5.0000 mg | ORAL_TABLET | Freq: Three times a day (TID) | ORAL | Status: DC

## 2013-07-25 NOTE — Telephone Encounter (Signed)
Methadone 5mg  rxs x 3 ( Feb, Mar,Apr)faxed to [facility]; confirmed received faxs per [place].

## 2013-09-13 ENCOUNTER — Telehealth: Payer: Self-pay | Admitting: *Deleted

## 2013-09-13 DIAGNOSIS — J309 Allergic rhinitis, unspecified: Secondary | ICD-10-CM | POA: Insufficient documentation

## 2013-09-13 MED ORDER — FLUTICASONE PROPIONATE 50 MCG/ACT NA SUSP: 2.0000 | Freq: Every day | NASAL | Status: DC

## 2013-09-13 NOTE — Telephone Encounter (Signed)
[facility] informed of new rx for Flonase via fax. Message sent to front office to schedule pt an appt w/Dr Eppie Gibson.

## 2013-09-13 NOTE — Telephone Encounter (Signed)
Fax from [facility] - states pt c/o stuffy nose;requesting nasal spray; would like something PRN for allergies. Thanks

## 2013-09-30 ENCOUNTER — Other Ambulatory Visit: Payer: Self-pay | Admitting: Internal Medicine

## 2013-09-30 DIAGNOSIS — K5903 Drug induced constipation: Secondary | ICD-10-CM

## 2013-10-16 ENCOUNTER — Other Ambulatory Visit: Payer: Self-pay | Admitting: *Deleted

## 2013-10-16 DIAGNOSIS — F419 Anxiety disorder, unspecified: Secondary | ICD-10-CM

## 2013-10-17 ENCOUNTER — Other Ambulatory Visit: Payer: Self-pay | Admitting: *Deleted

## 2013-10-17 DIAGNOSIS — G894 Chronic pain syndrome: Secondary | ICD-10-CM

## 2013-10-17 MED ORDER — LORAZEPAM 1 MG PO TABS: 1.0000 mg | ORAL_TABLET | Freq: Two times a day (BID) | ORAL | Status: DC

## 2013-10-17 NOTE — Telephone Encounter (Signed)
Rx called in 

## 2013-10-17 NOTE — Telephone Encounter (Signed)
Please do 3 months worth

## 2013-10-18 ENCOUNTER — Telehealth: Payer: Self-pay | Admitting: *Deleted

## 2013-10-18 MED ORDER — METHADONE HCL 5 MG PO TABS: 5.0000 mg | ORAL_TABLET | Freq: Three times a day (TID) | ORAL | Status: DC

## 2013-10-18 NOTE — Telephone Encounter (Signed)
Methadone rx's x 3 (for May, June, and July) were faxed to [facility]; confirmed received per Jackelyn Poling.

## 2013-10-30 ENCOUNTER — Other Ambulatory Visit: Payer: Self-pay | Admitting: *Deleted

## 2013-10-30 DIAGNOSIS — G8929 Other chronic pain: Secondary | ICD-10-CM

## 2013-10-31 MED ORDER — TRAMADOL HCL 50 MG PO TABS: 50.0000 mg | ORAL_TABLET | Freq: Four times a day (QID) | ORAL | Status: DC | PRN

## 2013-10-31 NOTE — Telephone Encounter (Signed)
Called to pharm 

## 2013-11-01 ENCOUNTER — Encounter: Payer: Self-pay | Admitting: Internal Medicine

## 2013-11-01 ENCOUNTER — Ambulatory Visit (INDEPENDENT_AMBULATORY_CARE_PROVIDER_SITE_OTHER): Payer: Medicare Other | Admitting: Internal Medicine

## 2013-11-01 VITALS — BP 129/77 | HR 88 | Temp 97.3°F | Wt 199.2 lb

## 2013-11-01 DIAGNOSIS — N138 Other obstructive and reflux uropathy: Secondary | ICD-10-CM

## 2013-11-01 DIAGNOSIS — M7989 Other specified soft tissue disorders: Secondary | ICD-10-CM

## 2013-11-01 DIAGNOSIS — C44319 Basal cell carcinoma of skin of other parts of face: Secondary | ICD-10-CM

## 2013-11-01 DIAGNOSIS — I10 Essential (primary) hypertension: Secondary | ICD-10-CM

## 2013-11-01 DIAGNOSIS — G894 Chronic pain syndrome: Secondary | ICD-10-CM

## 2013-11-01 DIAGNOSIS — N139 Obstructive and reflux uropathy, unspecified: Secondary | ICD-10-CM

## 2013-11-01 DIAGNOSIS — F411 Generalized anxiety disorder: Secondary | ICD-10-CM

## 2013-11-01 DIAGNOSIS — F419 Anxiety disorder, unspecified: Secondary | ICD-10-CM

## 2013-11-01 DIAGNOSIS — N401 Enlarged prostate with lower urinary tract symptoms: Secondary | ICD-10-CM

## 2013-11-01 DIAGNOSIS — F329 Major depressive disorder, single episode, unspecified: Secondary | ICD-10-CM

## 2013-11-01 DIAGNOSIS — C44310 Basal cell carcinoma of skin of unspecified parts of face: Secondary | ICD-10-CM

## 2013-11-01 MED ORDER — ASPIRIN 81 MG PO TBEC: 81.0000 mg | DELAYED_RELEASE_TABLET | Freq: Every day | ORAL | Status: DC

## 2013-11-01 MED ORDER — FINASTERIDE 5 MG PO TABS: 5.0000 mg | ORAL_TABLET | Freq: Every day | ORAL | Status: DC

## 2013-11-01 MED ORDER — SERTRALINE HCL 50 MG PO TABS: 50.0000 mg | ORAL_TABLET | Freq: Every day | ORAL | Status: DC

## 2013-11-01 NOTE — Assessment & Plan Note (Signed)
He continues to have chronic pain but states it is unchanged since the last visit. He is satisfied with the methadone 5 mg every 8 hours and tramadol 50 mg by mouth every 6 hours as needed. He states that he occasionally forgets to ask for the as needed tramadol in the evening. Once the staff leave at the assisted living facility he feels he is unable to obtain the medication at a later time. We both therefore thought it would be best if we scheduled the evening dose of the tramadol to be sure he received this every night. This change was communicated to the assisted living facility via there clinic visit update paperwork.

## 2013-11-01 NOTE — Patient Instructions (Signed)
It was great to see you again.  I am sorry about your eye pain and your depression.  1) I wrote an excuse for you to have breakfast in your room until the eye medication kicks in each morning for your eye pain.  2) We stopped your Paxil (paroxetine) and replaced it with Zoloft (sertralene) 50 mg by mouth each morning.  This should help your depression and your anxiety.  3) I asked that your evening dose of tramadol be scheduled every evening rather than you having to remember to ask for it.  I think this may help your pain each night as you will be getting this medication for sure.  I also think it may help your sleep.  4) I remain worried about your prostate (making you get up frequently at night to urinate).  I have you on 2 medications for this but they do not seem to be working completely.  You did not want to see a Urologist at this time, but if things worsen let me know and I will be happy to refer you to a Urologist.  I will see you back in 6 months, sooner if necessary.

## 2013-11-01 NOTE — Assessment & Plan Note (Signed)
He continues to feel depressed and attributes this to his worries over his sister's health, being in an assisted living facility, and being a loner. In the past he was hesitant to try a different SSRI from his Paxil. Today he is willing to make the change. I therefore discontinued the Paxil and started sertraline 50 mg by mouth daily. If this is partially effective we will titrate to 100 mg daily at the followup visit.

## 2013-11-01 NOTE — Assessment & Plan Note (Signed)
He is followed closely in dermatology and states he has an appointment in the near future. I will defer management of his skin cancer to the dermatologist.

## 2013-11-01 NOTE — Progress Notes (Signed)
   Subjective:    Patient ID: Zachary [name], male    DOB: 22-Apr-1930, 80 y.o.   MRN: [id number]  HPI  Please see the A&P for the status of the pt's chronic medical problems.  Review of Systems  Constitutional: Negative for activity change, appetite change, fatigue and unexpected weight change.  Eyes: Positive for pain, discharge and redness.       Left eye  Respiratory: Negative for cough, chest tightness and shortness of breath.   Cardiovascular: Positive for leg swelling. Negative for chest pain and palpitations.       Trace edema, chronic  Gastrointestinal: Positive for constipation. Negative for nausea, abdominal pain and diarrhea.  Endocrine: Positive for polyuria.  Genitourinary: Positive for frequency.  Musculoskeletal: Positive for arthralgias and myalgias. Negative for back pain, gait problem and joint swelling.  Skin: Positive for wound.       Chronic sun damage with hyperkeratosis  Neurological: Positive for light-headedness.       Upon standing quickly  Psychiatric/Behavioral: Positive for sleep disturbance and dysphoric mood. The patient is nervous/anxious.       Objective:   Physical Exam  Nursing note and vitals reviewed. Constitutional: He is oriented to person, place, and time. He appears well-developed and well-nourished. No distress.  HENT:  Head: Normocephalic and atraumatic.  Eyes:    Cardiovascular: Normal rate, regular rhythm and normal heart sounds.  Exam reveals no gallop and no friction rub.   No murmur heard. Pulmonary/Chest: Effort normal and breath sounds normal. No respiratory distress. He has no wheezes. He has no rales.  Abdominal: Soft. Bowel sounds are normal. He exhibits no distension. There is no tenderness. There is no rebound and no guarding.  Musculoskeletal: Normal range of motion. He exhibits edema. He exhibits no tenderness.  Trace edema bilaterally  Neurological: He is alert and oriented to person, place, and time. He exhibits normal  muscle tone.  Skin: Skin is warm and dry. He is not diaphoretic.  Chronic sun damage with hyperkeratosis  Psychiatric: His speech is normal and behavior is normal. Judgment and thought content normal. His affect is blunt. Cognition and memory are normal. He exhibits a depressed mood.      Assessment & Plan:   Please see problem oriented charting.

## 2013-11-01 NOTE — Assessment & Plan Note (Signed)
His lower extremity swelling is stable since the last clinic visit. It is minor on examination and he is satisfied with the daily Lasix. We will continue the Lasix at 40 mg by mouth daily. A basic metabolic panel was drawn today and is pending at the time of this dictation.

## 2013-11-01 NOTE — Assessment & Plan Note (Signed)
He continues to note nocturia x 5 despite finasteride and tamsulosin. He does admit to some mild orthostatic dizziness upon standing quickly. I offered him a referral to urologist to consider a transurethral resection of the prostate as a possible solution. He was not interested in the urologic referral at this time and wanted to continue with the finasteride and tamsulosin as currently dosed.

## 2013-11-01 NOTE — Assessment & Plan Note (Signed)
His blood pressure today was 129/77. This is on amlodipine 10 mg by mouth daily and furosemide 40 mg by mouth daily. It is likely the amlodipine is responsible for a his excellent blood pressure will be continued at this time.

## 2013-11-01 NOTE — Assessment & Plan Note (Signed)
He remains anxious and attributes this to concerns over his sister's well-being. She lives alone in [place] and is refusing to be moved to an assisted living facility. He states that she is followed closely by her church and neighbors and this gives him some solace. It is hoped that the change from the Paxil to sertraline will help with the anxiety. We will also continue the lorazepam.

## 2013-11-02 LAB — BASIC METABOLIC PANEL WITH GFR
BUN: 24 mg/dL — ABNORMAL HIGH (ref 6–23)
CALCIUM: 9.2 mg/dL (ref 8.4–10.5)
CHLORIDE: 100 meq/L (ref 96–112)
CO2: 27 meq/L (ref 19–32)
Creat: 1.42 mg/dL — ABNORMAL HIGH (ref 0.50–1.35)
GFR, EST NON AFRICAN AMERICAN: 46 mL/min — AB
GFR, Est African American: 53 mL/min — ABNORMAL LOW
Glucose, Bld: 91 mg/dL (ref 70–99)
Potassium: 4.2 mEq/L (ref 3.5–5.3)
SODIUM: 137 meq/L (ref 135–145)

## 2013-11-08 ENCOUNTER — Encounter: Payer: Self-pay | Admitting: Internal Medicine

## 2013-11-08 ENCOUNTER — Ambulatory Visit: Payer: Medicare Other | Admitting: Internal Medicine

## 2013-11-08 DIAGNOSIS — N183 Chronic kidney disease, stage 3 unspecified: Secondary | ICD-10-CM

## 2013-11-08 HISTORY — DX: Chronic kidney disease, stage 3 unspecified: N18.30

## 2013-11-08 NOTE — Progress Notes (Signed)
BMP: BUN 24, Cr 1.42, eGFR 46.  Relatively stable renal function, no hypokalemia or hyperkalemia.  Continue current diuretic therapy for symptomatic LE edema.

## 2013-11-21 ENCOUNTER — Other Ambulatory Visit: Payer: Self-pay | Admitting: *Deleted

## 2013-11-21 DIAGNOSIS — G894 Chronic pain syndrome: Secondary | ICD-10-CM

## 2013-11-27 MED ORDER — METHADONE HCL 5 MG PO TABS: 5.0000 mg | ORAL_TABLET | Freq: Three times a day (TID) | ORAL | Status: DC

## 2013-11-28 NOTE — Telephone Encounter (Signed)
Rxs  X 2 faxed to [facility]; received per Estill Bamberg.

## 2013-12-10 ENCOUNTER — Other Ambulatory Visit: Payer: Self-pay | Admitting: Dermatology

## 2014-01-17 ENCOUNTER — Other Ambulatory Visit: Payer: Self-pay | Admitting: *Deleted

## 2014-01-17 NOTE — Telephone Encounter (Signed)
Called [facility] they have 2 scripts will fill today- methadone

## 2014-02-06 ENCOUNTER — Telehealth: Payer: Self-pay | Admitting: Licensed Clinical Social Worker

## 2014-02-06 NOTE — Telephone Encounter (Signed)
ALF to provide transportation for Mr. Beske appointment on 8/21.  CSW will request [facility] to assist with Mr. Eudy Transportation assessment for future need.

## 2014-02-06 NOTE — Telephone Encounter (Signed)
Call transferred to [facility].  Pt scheduled appointment with PCP for Friday 02/07/14.  According to ALF staff, facility does not provide transportation on Fridays.  Zachary [name] does have Medicaid, CSW will inquire if pt can access Medicaid medical transportation.   Received information back from transportation, pt in need of Transportation Assessment prior to accessing services.  CSW notified ALF.  ALF will discuss with Zachary [name] option for rescheduling appointment and proceeding with transportation assessment.  Awaiting response from ALF.

## 2014-02-07 ENCOUNTER — Encounter: Payer: Self-pay | Admitting: Licensed Clinical Social Worker

## 2014-02-07 ENCOUNTER — Ambulatory Visit (INDEPENDENT_AMBULATORY_CARE_PROVIDER_SITE_OTHER): Payer: Medicare Other | Admitting: Internal Medicine

## 2014-02-07 ENCOUNTER — Encounter: Payer: Self-pay | Admitting: Internal Medicine

## 2014-02-07 VITALS — BP 124/79 | HR 76 | Temp 97.0°F | Wt 200.1 lb

## 2014-02-07 DIAGNOSIS — G894 Chronic pain syndrome: Secondary | ICD-10-CM

## 2014-02-07 DIAGNOSIS — Z0000 Encounter for general adult medical examination without abnormal findings: Secondary | ICD-10-CM

## 2014-02-07 DIAGNOSIS — Z23 Encounter for immunization: Secondary | ICD-10-CM

## 2014-02-07 DIAGNOSIS — F332 Major depressive disorder, recurrent severe without psychotic features: Secondary | ICD-10-CM

## 2014-02-07 DIAGNOSIS — I10 Essential (primary) hypertension: Secondary | ICD-10-CM

## 2014-02-07 MED ORDER — SERTRALINE HCL 100 MG PO TABS: 100.0000 mg | ORAL_TABLET | Freq: Every day | ORAL | Status: DC

## 2014-02-07 NOTE — Assessment & Plan Note (Signed)
BP Readings from Last 3 Encounters:  02/07/14 124/79  11/01/13 129/77  03/07/13 121/69    Lab Results  Component Value Date   NA 137 11/01/2013   K 4.2 11/01/2013   CREATININE 1.42* 11/01/2013    Assessment/Plan: Blood pressure control: controlled Progress toward BP goal:  at goal Comments: We will continue the amlodipine 10 mg by mouth daily.

## 2014-02-07 NOTE — Assessment & Plan Note (Signed)
He continues to note diffuse pain in the joints, legs, and back. He is happy with the methadone and tramadol at the current doses. These will therefore be continued. Examination reveals significant muscle spasm to the right of the lumbar spine. This is despite lorazepam. Review of previous x-rays of the lumbar spine as back as far as 2006 where he was noted to have a mild anterolisthesis of L3-L4 and L4-L5. He wanted to give a lumbar support brace a trial as it improved the function in one of his acquaintances. As he qualifies for such given his spondylolithiasis a lumbar support brace was ordered with the size being extra-large given his circumference around the navel was 45 inches. We will reassess his pain control in the setting of continued methadone, tramadol, and a lumbar brace once he receives it at the followup visit.

## 2014-02-07 NOTE — Assessment & Plan Note (Signed)
He received the annual flu vaccination at today's visit. We will discuss the Zostavax at the followup visit.

## 2014-02-07 NOTE — Assessment & Plan Note (Signed)
He notes continued depression and thoughts about his children in [place] as well as concern over his sister's health despite the change from Paxil to Zoloft at the last visit. He is willing to try an increase in the Zoloft dose to 100 mg by mouth daily. This change was made in we will reassess his mood at the followup visit.

## 2014-02-07 NOTE — Telephone Encounter (Signed)
Request sent to [facility].

## 2014-02-07 NOTE — Patient Instructions (Signed)
It was great to see you again.  I always enjoy visiting with you.  I am sorry your back pain continues to be a problem.  1) For the back pain we ordered a lumbar support brace.  I will fax the form into the company.  I suspect you will receive it in the mail.  When you get it, give it a try to see if it helps with the pain.  Also continue with the pain medications that you have been taking.  2) For the depression I increased the sertraline to 100 mg daily.  3) Keep taking all of the other medications just as you have been.  I will see you back in 3 months, sooner if necessary.

## 2014-02-07 NOTE — Progress Notes (Signed)
   Subjective:    Patient ID: Zachary [name], male    DOB: November 28, 1929, 80 y.o.   MRN: [id number]  HPI  Please see the A&P for the status of the pt's chronic medical problems.  Review of Systems  Constitutional: Negative for activity change, appetite change and unexpected weight change.  Eyes: Positive for redness.  Cardiovascular: Negative for chest pain and leg swelling.  Gastrointestinal: Positive for abdominal pain and constipation.       Chronic abdominal pain relieved with defecation  Endocrine: Positive for polyuria.  Genitourinary: Positive for frequency and difficulty urinating.  Musculoskeletal: Positive for arthralgias, back pain and myalgias.  Skin: Positive for rash and wound.  Psychiatric/Behavioral: Positive for dysphoric mood. The patient is nervous/anxious.       Objective:   Physical Exam  Nursing note and vitals reviewed. Constitutional: He is oriented to person, place, and time. He appears well-developed and well-nourished. No distress.  HENT:  Head: Normocephalic and atraumatic.  Cardiovascular: Normal rate, regular rhythm and normal heart sounds.  Exam reveals no gallop and no friction rub.   No murmur heard. Pulmonary/Chest: Effort normal and breath sounds normal. No respiratory distress. He has no wheezes. He has no rales. He exhibits no tenderness.  Abdominal: Soft. Bowel sounds are normal. He exhibits no distension. There is no tenderness. There is no rebound and no guarding.  Musculoskeletal: Normal range of motion. He exhibits tenderness. He exhibits no edema.       Back:  Neurological: He is alert and oriented to person, place, and time. He exhibits normal muscle tone.  Skin: Skin is warm and dry. Rash noted. He is not diaphoretic.  Psychiatric: His speech is normal and behavior is normal. Judgment and thought content normal. His affect is blunt. Cognition and memory are normal.      Assessment & Plan:   Please see problem oriented charting.

## 2014-02-11 ENCOUNTER — Other Ambulatory Visit: Payer: Self-pay | Admitting: *Deleted

## 2014-02-11 DIAGNOSIS — G894 Chronic pain syndrome: Secondary | ICD-10-CM

## 2014-02-11 MED ORDER — METHADONE HCL 5 MG PO TABS: 5.0000 mg | ORAL_TABLET | Freq: Three times a day (TID) | ORAL | Status: DC

## 2014-02-11 MED ORDER — ARTIFICIAL TEARS OP OINT: TOPICAL_OINTMENT | OPHTHALMIC | Status: DC

## 2014-02-11 NOTE — Telephone Encounter (Signed)
Please fax the artificial tears prescription, if they did not receive it.  I rewrote it to be faxed.  Thanks.

## 2014-02-11 NOTE — Telephone Encounter (Signed)
Facility faxes request for artificial tears order for daily use, if you approve triage will fax the order for the artificial tears Thank you Also his last of 3 scripts will be filled this week, if you would like to go ahead and do next set of 3 , we will fax those to [facility]

## 2014-02-12 NOTE — Telephone Encounter (Signed)
Faxed, mailed

## 2014-02-14 ENCOUNTER — Encounter: Payer: Self-pay | Admitting: Licensed Clinical Social Worker

## 2014-02-14 NOTE — Telephone Encounter (Signed)
A user error has taken place: encounter opened in error, closed for administrative reasons.

## 2014-02-14 NOTE — Progress Notes (Signed)
CSW received completed patient application for the [employer] program.  CSW completed [facility] portion and placed in mail.

## 2014-02-19 ENCOUNTER — Other Ambulatory Visit: Payer: Self-pay | Admitting: Dermatology

## 2014-04-02 NOTE — Progress Notes (Signed)
04/02/14: Zachary [name] placed call to Zachary [name] inquiring about [employer] application.  CSW placed call to [employer], they do not have receipt of application and requesting CSW to resend.  CSW resending application today.  Zachary [name] notified.

## 2014-04-21 ENCOUNTER — Telehealth: Payer: Self-pay | Admitting: Licensed Clinical Social Worker

## 2014-04-21 NOTE — Telephone Encounter (Signed)
[name] returned call to [place].  Pt informed of receipt of eyeglass voucher.  CSW provided [name] with two option to complete eyeglass order: meet with CSW and complete online or complete hard copy and mail in.  Pt requesting CSW to mail voucher and order form.  He will complete and mail in.  CSW mailed order form to [name] with letter and instructions.

## 2014-04-21 NOTE — Telephone Encounter (Signed)
CSW received pt's voucher for eyeglasses from [employer] program.  CSW placed called to pt.  CSW left message requesting return call. CSW provided contact hours and phone number.

## 2014-04-24 ENCOUNTER — Other Ambulatory Visit: Payer: Self-pay | Admitting: *Deleted

## 2014-04-24 DIAGNOSIS — G894 Chronic pain syndrome: Secondary | ICD-10-CM

## 2014-04-24 NOTE — Telephone Encounter (Signed)
Need hard script

## 2014-04-25 MED ORDER — METHADONE HCL 5 MG PO TABS: 5.0000 mg | ORAL_TABLET | Freq: Three times a day (TID) | ORAL | Status: DC

## 2014-05-01 ENCOUNTER — Other Ambulatory Visit: Payer: Self-pay | Admitting: *Deleted

## 2014-05-01 DIAGNOSIS — F419 Anxiety disorder, unspecified: Secondary | ICD-10-CM

## 2014-05-01 MED ORDER — LORAZEPAM 1 MG PO TABS: 1.0000 mg | ORAL_TABLET | Freq: Two times a day (BID) | ORAL | Status: DC

## 2014-05-01 NOTE — Telephone Encounter (Signed)
Called to pharmacy 

## 2014-05-23 ENCOUNTER — Other Ambulatory Visit: Payer: Self-pay | Admitting: *Deleted

## 2014-05-23 DIAGNOSIS — G894 Chronic pain syndrome: Secondary | ICD-10-CM

## 2014-05-25 MED ORDER — TRAMADOL HCL 50 MG PO TABS: 50.0000 mg | ORAL_TABLET | Freq: Four times a day (QID) | ORAL | Status: DC | PRN

## 2014-05-27 NOTE — Telephone Encounter (Signed)
Rx called in 

## 2014-06-30 ENCOUNTER — Other Ambulatory Visit: Payer: Self-pay | Admitting: Internal Medicine

## 2014-06-30 DIAGNOSIS — F332 Major depressive disorder, recurrent severe without psychotic features: Secondary | ICD-10-CM

## 2014-07-30 ENCOUNTER — Encounter: Payer: Self-pay | Admitting: *Deleted

## 2014-08-19 ENCOUNTER — Other Ambulatory Visit: Payer: Self-pay | Admitting: *Deleted

## 2014-08-19 DIAGNOSIS — G894 Chronic pain syndrome: Secondary | ICD-10-CM

## 2014-08-19 NOTE — Telephone Encounter (Signed)
Need hard script

## 2014-08-20 NOTE — Telephone Encounter (Signed)
There should be a prescription on file written by Dr. Eppie Gibson in November.

## 2014-08-21 ENCOUNTER — Telehealth: Payer: Self-pay | Admitting: Internal Medicine

## 2014-08-21 MED ORDER — METHADONE HCL 5 MG PO TABS: 5.0000 mg | ORAL_TABLET | Freq: Three times a day (TID) | ORAL | Status: DC

## 2014-08-21 NOTE — Telephone Encounter (Signed)
Call to patient to confirm appointment for 08/25/14 at 9:45 left a message with receptionist to have someone call back to confirm appt

## 2014-08-25 ENCOUNTER — Ambulatory Visit (INDEPENDENT_AMBULATORY_CARE_PROVIDER_SITE_OTHER): Payer: Medicare Other | Admitting: Internal Medicine

## 2014-08-25 ENCOUNTER — Encounter: Payer: Self-pay | Admitting: Internal Medicine

## 2014-08-25 VITALS — BP 127/63 | HR 76 | Temp 97.0°F | Wt 193.8 lb

## 2014-08-25 DIAGNOSIS — K5909 Other constipation: Secondary | ICD-10-CM

## 2014-08-25 DIAGNOSIS — E669 Obesity, unspecified: Secondary | ICD-10-CM

## 2014-08-25 DIAGNOSIS — M1732 Unilateral post-traumatic osteoarthritis, left knee: Secondary | ICD-10-CM

## 2014-08-25 DIAGNOSIS — F419 Anxiety disorder, unspecified: Secondary | ICD-10-CM

## 2014-08-25 DIAGNOSIS — Z23 Encounter for immunization: Secondary | ICD-10-CM

## 2014-08-25 DIAGNOSIS — F332 Major depressive disorder, recurrent severe without psychotic features: Secondary | ICD-10-CM | POA: Diagnosis not present

## 2014-08-25 DIAGNOSIS — T50905A Adverse effect of unspecified drugs, medicaments and biological substances, initial encounter: Secondary | ICD-10-CM

## 2014-08-25 DIAGNOSIS — I10 Essential (primary) hypertension: Secondary | ICD-10-CM

## 2014-08-25 DIAGNOSIS — K5903 Drug induced constipation: Secondary | ICD-10-CM

## 2014-08-25 DIAGNOSIS — G894 Chronic pain syndrome: Secondary | ICD-10-CM

## 2014-08-25 MED ORDER — IBUPROFEN 800 MG PO TABS: 800.0000 mg | ORAL_TABLET | Freq: Three times a day (TID) | ORAL | Status: DC | PRN

## 2014-08-25 NOTE — Assessment & Plan Note (Signed)
His blood pressure today is 127/63 which is well within target on amlodipine 10 mg by mouth daily. We will continue the amlodipine 10 mg by mouth daily and reassess his blood pressure control at the follow-up visit.

## 2014-08-25 NOTE — Assessment & Plan Note (Signed)
He continues to have occasional constipation related to his narcotic pain medication. He is on a bowel regimen that includes MiraLAX which is written as needed. He states when he receives the MiraLAX he does have a bowel movement and relief of some abdominal discomfort. He was encouraged to continue to ask for the MiraLAX. I sent a message to the assisted living facility that it was important that he receive the MiraLAX when he asks. Looking at the [facility], despite asking for the MiraLAX it appears he has not received any in the last week.

## 2014-08-25 NOTE — Assessment & Plan Note (Signed)
He notes continued pain in the left knee when he walks. On a rare occasion he will note that it senses like it may give way but it never does. It also does not lock. On examination he has tenderness on the tibial joint line of the left knee medially. This represents left knee osteoarthritis of the medial compartment. We will try ibuprofen 800 mg by mouth every 8 hours as needed for pain to see if this helps with the inflammation associated with his chronic osteoarthritis.

## 2014-08-25 NOTE — Assessment & Plan Note (Signed)
His chronic pain syndrome is stable on the methadone 5 mg by mouth every 8 hours and tramadol 50 mg by mouth every 6 hours as needed for pain. Most of this chronic pain syndrome is located in the lower back. He does not want any adjustments in his regimen at this time given the stability of his symptoms.

## 2014-08-25 NOTE — Progress Notes (Signed)
   Subjective:    Patient ID: Zachary [name], male    DOB: [birth year], 81 y.o.   MRN: [id number]  HPI  Please see the A&P for the status of the pt's chronic medical problems.  Review of Systems  Constitutional: Negative for activity change, appetite change and unexpected weight change.  Eyes: Positive for discharge, redness and visual disturbance.  Respiratory: Negative for shortness of breath and wheezing.   Cardiovascular: Negative for chest pain, palpitations and leg swelling.  Gastrointestinal: Positive for constipation. Negative for nausea, vomiting and diarrhea.  Musculoskeletal: Positive for myalgias, back pain, arthralgias and gait problem. Negative for joint swelling.  Skin: Positive for rash.  Psychiatric/Behavioral: Positive for sleep disturbance and dysphoric mood. The patient is nervous/anxious.       Objective:   Physical Exam  Constitutional: He is oriented to person, place, and time. He appears well-developed and well-nourished. No distress.  HENT:  Head: Normocephalic and atraumatic.  Abdominal: Soft. Bowel sounds are normal. He exhibits no distension. There is no tenderness. There is no rebound and no guarding.  Musculoskeletal: Normal range of motion. He exhibits no edema or tenderness.  Neurological: He is alert and oriented to person, place, and time. He exhibits normal muscle tone.  Skin: Skin is warm. Rash noted. He is not diaphoretic. There is pallor.  Psychiatric: He has a normal mood and affect. His behavior is normal. Judgment and thought content normal.  Nursing note and vitals reviewed.     Assessment & Plan:   Please see problem oriented charting.

## 2014-08-25 NOTE — Assessment & Plan Note (Signed)
His anxiety is closely tied to his depression and his concern over his sister's failing health. He is satisfied with the lorazepam 1 mg by mouth every 12 hours and is not interested in making any changes in his anti-anxiety regimen at this time. We will also continue the Zoloft for his anxiety as well as his depression.

## 2014-08-25 NOTE — Assessment & Plan Note (Signed)
He received the Pneumovax 13 vaccination today. We will discuss his interest in the Zostavax at the follow-up visit.

## 2014-08-25 NOTE — Patient Instructions (Signed)
It was great to see you again.  You are doing a good job caring for yourself.  1) Keep taking the medications as you are.  2) I start ibuprofen 800 mg as needed every eight hours for left knee arthritis pain.  3) Keep taking your miralax as needed for constipation.  4) We gave you the pneumovax 13 vaccination today as it was due.  I will see you back in 6 months, sooner if necessary.

## 2014-08-25 NOTE — Assessment & Plan Note (Signed)
At the last clinic visit we increased his Zoloft to 100 mg by mouth daily. He states that his depressed mood is unchanged but he is not interested in making any adjustments in his pharmacologic regimen at this time. He continues to worry about his sister and her worsening health as well as feeling isolated in his assisted living facility. We will continue with the Zoloft 100 mg by mouth daily and reassess his depression symptoms at the follow-up visit and see if he would be interested in making any changes in his pharmacologic regimen.

## 2014-08-25 NOTE — Assessment & Plan Note (Signed)
His weight today was 193 which is down 7 pounds from the previous visit 6 months ago. He was praised on his success in losing the weight and states that he has been careful with his portion sizes and is satisfied with the smaller sizes that he has been asking for. He was encouraged to maintain this progress in weight control as I believe it will also help his left knee osteoarthritis.

## 2014-08-29 ENCOUNTER — Encounter: Payer: Medicare Other | Admitting: Internal Medicine

## 2014-10-24 ENCOUNTER — Other Ambulatory Visit: Payer: Self-pay | Admitting: *Deleted

## 2014-10-24 DIAGNOSIS — F419 Anxiety disorder, unspecified: Secondary | ICD-10-CM

## 2014-10-24 MED ORDER — LORAZEPAM 1 MG PO TABS: 1.0000 mg | ORAL_TABLET | Freq: Two times a day (BID) | ORAL | Status: DC

## 2014-10-24 NOTE — Telephone Encounter (Signed)
Please print out this Rx for [place].

## 2014-10-28 NOTE — Telephone Encounter (Signed)
Faxed in

## 2014-11-20 ENCOUNTER — Other Ambulatory Visit: Payer: Self-pay | Admitting: *Deleted

## 2014-11-20 DIAGNOSIS — G894 Chronic pain syndrome: Secondary | ICD-10-CM

## 2014-11-20 NOTE — Telephone Encounter (Signed)
Would you like the attending to do these next 3- June, July and August ? Last filled 10/20/2014

## 2014-11-21 MED ORDER — METHADONE HCL 5 MG PO TABS: 5.0000 mg | ORAL_TABLET | Freq: Three times a day (TID) | ORAL | Status: DC

## 2014-11-21 NOTE — Telephone Encounter (Signed)
I went ahead and filled for 3 months bc almost 4 PM on Friday and it appears the pt is going to run out based on dates. Med and dose confirmed. Needs Sept appt PCP.

## 2014-11-21 NOTE — Telephone Encounter (Signed)
Called and mailed to [facility] raliegh

## 2014-12-04 ENCOUNTER — Other Ambulatory Visit: Payer: Self-pay | Admitting: Internal Medicine

## 2014-12-04 DIAGNOSIS — I10 Essential (primary) hypertension: Secondary | ICD-10-CM

## 2014-12-04 DIAGNOSIS — N138 Other obstructive and reflux uropathy: Secondary | ICD-10-CM

## 2014-12-04 DIAGNOSIS — N401 Enlarged prostate with lower urinary tract symptoms: Secondary | ICD-10-CM

## 2014-12-04 MED ORDER — FINASTERIDE 5 MG PO TABS: 5.0000 mg | ORAL_TABLET | Freq: Every day | ORAL | Status: DC

## 2014-12-04 MED ORDER — ASPIRIN 81 MG PO TBEC: 81.0000 mg | DELAYED_RELEASE_TABLET | Freq: Every day | ORAL | Status: DC

## 2014-12-17 ENCOUNTER — Other Ambulatory Visit: Payer: Self-pay | Admitting: *Deleted

## 2014-12-17 DIAGNOSIS — G894 Chronic pain syndrome: Secondary | ICD-10-CM

## 2014-12-17 MED ORDER — TRAMADOL HCL 50 MG PO TABS: 50.0000 mg | ORAL_TABLET | Freq: Four times a day (QID) | ORAL | Status: DC | PRN

## 2014-12-18 NOTE — Telephone Encounter (Signed)
Tramadol rx called to Raquel Sarna at [facility] in [place].

## 2015-01-16 ENCOUNTER — Telehealth: Payer: Self-pay | Admitting: Internal Medicine

## 2015-01-16 NOTE — Telephone Encounter (Signed)
Phone call from [facility] regarding patient's Pain medications.

## 2015-01-16 NOTE — Telephone Encounter (Signed)
I called pharmacy and they have the Rx.  Zachary [name] will call them.

## 2015-01-27 ENCOUNTER — Encounter (HOSPITAL_COMMUNITY): Payer: Self-pay | Admitting: Emergency Medicine

## 2015-01-27 ENCOUNTER — Telehealth: Payer: Self-pay | Admitting: Internal Medicine

## 2015-01-27 ENCOUNTER — Emergency Department (HOSPITAL_COMMUNITY)
Admission: EM | Admit: 2015-01-27 | Discharge: 2015-01-27 | Disposition: A | Discharge status: Home / Self Care | Payer: Medicare Other | Attending: Emergency Medicine | Admitting: Emergency Medicine

## 2015-01-27 ENCOUNTER — Emergency Department (HOSPITAL_COMMUNITY): Payer: Medicare Other

## 2015-01-27 DIAGNOSIS — K5909 Other constipation: Secondary | ICD-10-CM | POA: Insufficient documentation

## 2015-01-27 DIAGNOSIS — F322 Major depressive disorder, single episode, severe without psychotic features: Secondary | ICD-10-CM | POA: Diagnosis not present

## 2015-01-27 DIAGNOSIS — Y92009 Unspecified place in unspecified non-institutional (private) residence as the place of occurrence of the external cause: Secondary | ICD-10-CM | POA: Diagnosis not present

## 2015-01-27 DIAGNOSIS — S20219A Contusion of unspecified front wall of thorax, initial encounter: Secondary | ICD-10-CM

## 2015-01-27 DIAGNOSIS — S8991XA Unspecified injury of right lower leg, initial encounter: Secondary | ICD-10-CM | POA: Diagnosis not present

## 2015-01-27 DIAGNOSIS — G894 Chronic pain syndrome: Secondary | ICD-10-CM | POA: Diagnosis not present

## 2015-01-27 DIAGNOSIS — Y998 Other external cause status: Secondary | ICD-10-CM | POA: Diagnosis not present

## 2015-01-27 DIAGNOSIS — Z79899 Other long term (current) drug therapy: Secondary | ICD-10-CM | POA: Insufficient documentation

## 2015-01-27 DIAGNOSIS — M179 Osteoarthritis of knee, unspecified: Secondary | ICD-10-CM | POA: Insufficient documentation

## 2015-01-27 DIAGNOSIS — N183 Chronic kidney disease, stage 3 (moderate): Secondary | ICD-10-CM | POA: Insufficient documentation

## 2015-01-27 DIAGNOSIS — I129 Hypertensive chronic kidney disease with stage 1 through stage 4 chronic kidney disease, or unspecified chronic kidney disease: Secondary | ICD-10-CM | POA: Diagnosis not present

## 2015-01-27 DIAGNOSIS — N401 Enlarged prostate with lower urinary tract symptoms: Secondary | ICD-10-CM | POA: Diagnosis not present

## 2015-01-27 DIAGNOSIS — Z85828 Personal history of other malignant neoplasm of skin: Secondary | ICD-10-CM | POA: Insufficient documentation

## 2015-01-27 DIAGNOSIS — F419 Anxiety disorder, unspecified: Secondary | ICD-10-CM | POA: Insufficient documentation

## 2015-01-27 DIAGNOSIS — W06XXXA Fall from bed, initial encounter: Secondary | ICD-10-CM

## 2015-01-27 DIAGNOSIS — K219 Gastro-esophageal reflux disease without esophagitis: Secondary | ICD-10-CM | POA: Diagnosis not present

## 2015-01-27 DIAGNOSIS — Y9389 Activity, other specified: Secondary | ICD-10-CM | POA: Insufficient documentation

## 2015-01-27 DIAGNOSIS — E669 Obesity, unspecified: Secondary | ICD-10-CM | POA: Insufficient documentation

## 2015-01-27 DIAGNOSIS — H269 Unspecified cataract: Secondary | ICD-10-CM | POA: Diagnosis not present

## 2015-01-27 DIAGNOSIS — K222 Esophageal obstruction: Secondary | ICD-10-CM | POA: Insufficient documentation

## 2015-01-27 DIAGNOSIS — Z87891 Personal history of nicotine dependence: Secondary | ICD-10-CM | POA: Insufficient documentation

## 2015-01-27 DIAGNOSIS — Z7982 Long term (current) use of aspirin: Secondary | ICD-10-CM | POA: Insufficient documentation

## 2015-01-27 DIAGNOSIS — S0181XA Laceration without foreign body of other part of head, initial encounter: Secondary | ICD-10-CM

## 2015-01-27 DIAGNOSIS — S01111A Laceration without foreign body of right eyelid and periocular area, initial encounter: Secondary | ICD-10-CM | POA: Diagnosis not present

## 2015-01-27 DIAGNOSIS — Z7951 Long term (current) use of inhaled steroids: Secondary | ICD-10-CM | POA: Insufficient documentation

## 2015-01-27 MED ORDER — LIDOCAINE-EPINEPHRINE (PF) 2 %-1:200000 IJ SOLN
20.0000 mL | Freq: Once | INTRAMUSCULAR | Status: AC
  Administered 2015-01-27: 20 mL
  Filled 2015-01-27: qty 20

## 2015-01-27 NOTE — Telephone Encounter (Signed)
Please call with order to evaluate Zachary [name] for physical therapy after a traumatic fall.  Does he require gait training or other physical rehabilitation?  Thank You.
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
## 2015-01-27 NOTE — ED Provider Notes (Signed)
I was asked to repair the laceration on this patient's forehead after fall.I was not involved in any other care or medical decision-making of this patient.  LACERATION REPAIR Performed by: Margarita Mail Authorized by: Margarita Mail Consent: Verbal consent obtained. Risks and benefits: risks, benefits and alternatives were discussed Consent given by: patient Patient identity confirmed: provided demographic data Prepped and Draped in normal sterile fashion Wound explored  Laceration Location: right eyebrow  Laceration Length: 4cm  No Foreign Bodies seen or palpated  Anesthesia: local infiltration  Local anesthetic: lidocaine 2% with epinephrine  Anesthetic total: 4 ml  Irrigation method: syringe Amount of cleaning: standard  Skin closure: 6. 0 Prolene  Number of sutures: 3  Technique: simple interrupted  Patient tolerance: Patient tolerated the procedure well with no immediate complications.   Margarita Mail, PA-C [id number]  Delora Fuel, MD 36/14/43 [id number]

## 2015-01-27 NOTE — ED Notes (Signed)
Patient is complaining of headache and abd pain.  Patient has laceration to the right forehead.  Patient states his eyes are painful as well.

## 2015-01-27 NOTE — ED Notes (Signed)
Patient transported to X-ray 

## 2015-01-27 NOTE — Discharge Instructions (Signed)
Sutures will need to be removed in five days.  Fall Prevention and Home Safety Falls cause injuries and can affect all age groups. It is possible to use preventive measures to significantly decrease the likelihood of falls. There are many simple measures which can make your home safer and prevent falls. OUTDOORS  Repair cracks and edges of walkways and driveways.  Remove high doorway thresholds.  Trim shrubbery on the main path into your home.  Have good outside lighting.  Clear walkways of tools, rocks, debris, and clutter.  Check that handrails are not broken and are securely fastened. Both sides of steps should have handrails.  Have leaves, snow, and ice cleared regularly.  Use sand or salt on walkways during winter months.  In the garage, clean up grease or oil spills. BATHROOM  Install night lights.  Install grab bars by the toilet and in the tub and shower.  Use non-skid mats or decals in the tub or shower.  Place a plastic non-slip stool in the shower to sit on, if needed.  Keep floors dry and clean up all water on the floor immediately.  Remove soap buildup in the tub or shower on a regular basis.  Secure bath mats with non-slip, double-sided rug tape.  Remove throw rugs and tripping hazards from the floors. BEDROOMS  Install night lights.  Make sure a bedside light is easy to reach.  Do not use oversized bedding.  Keep a telephone by your bedside.  Have a firm chair with side arms to use for getting dressed.  Remove throw rugs and tripping hazards from the floor. KITCHEN  Keep handles on pots and pans turned toward the center of the stove. Use back burners when possible.  Clean up spills quickly and allow time for drying.  Avoid walking on wet floors.  Avoid hot utensils and knives.  Position shelves so they are not too high or low.  Place commonly used objects within easy reach.  If necessary, use a sturdy step stool with a grab bar when  reaching.  Keep electrical cables out of the way.  Do not use floor polish or wax that makes floors slippery. If you must use wax, use non-skid floor wax.  Remove throw rugs and tripping hazards from the floor. STAIRWAYS  Never leave objects on stairs.  Place handrails on both sides of stairways and use them. Fix any loose handrails. Make sure handrails on both sides of the stairways are as long as the stairs.  Check carpeting to make sure it is firmly attached along stairs. Make repairs to worn or loose carpet promptly.  Avoid placing throw rugs at the top or bottom of stairways, or properly secure the rug with carpet tape to prevent slippage. Get rid of throw rugs, if possible.  Have an electrician put in a light switch at the top and bottom of the stairs. OTHER FALL PREVENTION TIPS  Wear low-heel or rubber-soled shoes that are supportive and fit well. Wear closed toe shoes.  When using a stepladder, make sure it is fully opened and both spreaders are firmly locked. Do not climb a closed stepladder.  Add color or contrast paint or tape to grab bars and handrails in your home. Place contrasting color strips on first and last steps.  Learn and use mobility aids as needed. Install an electrical emergency response system.  Turn on lights to avoid dark areas. Replace light bulbs that burn out immediately. Get light switches that glow.  Arrange furniture  to create clear pathways. Keep furniture in the same place.  Firmly attach carpet with non-skid or double-sided tape.  Eliminate uneven floor surfaces.  Select a carpet pattern that does not visually hide the edge of steps.  Be aware of all pets. OTHER HOME SAFETY TIPS  Set the water temperature for 120 F (48.8 C).  Keep emergency numbers on or near the telephone.  Keep smoke detectors on every level of the home and near sleeping areas. Document Released: 05/27/2002 Document Revised: 12/06/2011 Document Reviewed:  08/26/2011 [facility] Patient Information 2015 [place], [place]. This information is not intended to replace advice given to you by your health care provider. Make sure you discuss any questions you have with your health care provider.  Laceration Care, Adult A laceration is a cut or lesion that goes through all layers of the skin and into the tissue just beneath the skin. TREATMENT  Some lacerations may not require closure. Some lacerations may not be able to be closed due to an increased risk of infection. It is important to see your caregiver as soon as possible after an injury to minimize the risk of infection and maximize the opportunity for successful closure. If closure is appropriate, pain medicines may be given, if needed. The wound will be cleaned to help prevent infection. Your caregiver will use stitches (sutures), staples, wound glue (adhesive), or skin adhesive strips to repair the laceration. These tools bring the skin edges together to allow for faster healing and a better cosmetic outcome. However, all wounds will heal with a scar. Once the wound has healed, scarring can be minimized by covering the wound with sunscreen during the day for 1 full year. HOME CARE INSTRUCTIONS  For sutures or staples:  Keep the wound clean and dry.  If you were given a bandage (dressing), you should change it at least once a day. Also, change the dressing if it becomes wet or dirty, or as directed by your caregiver.  Wash the wound with soap and water 2 times a day. Rinse the wound off with water to remove all soap. Pat the wound dry with a clean towel.  After cleaning, apply a thin layer of the antibiotic ointment as recommended by your caregiver. This will help prevent infection and keep the dressing from sticking.  You may shower as usual after the first 24 hours. Do not soak the wound in water until the sutures are removed.  Only take over-the-counter or prescription medicines for pain,  discomfort, or fever as directed by your caregiver.  Get your sutures or staples removed as directed by your caregiver. For skin adhesive strips:  Keep the wound clean and dry.  Do not get the skin adhesive strips wet. You may bathe carefully, using caution to keep the wound dry.  If the wound gets wet, pat it dry with a clean towel.  Skin adhesive strips will fall off on their own. You may trim the strips as the wound heals. Do not remove skin adhesive strips that are still stuck to the wound. They will fall off in time. For wound adhesive:  You may briefly wet your wound in the shower or bath. Do not soak or scrub the wound. Do not swim. Avoid periods of heavy perspiration until the skin adhesive has fallen off on its own. After showering or bathing, gently pat the wound dry with a clean towel.  Do not apply liquid medicine, cream medicine, or ointment medicine to your wound while the skin adhesive  is in place. This may loosen the film before your wound is healed.  If a dressing is placed over the wound, be careful not to apply tape directly over the skin adhesive. This may cause the adhesive to be pulled off before the wound is healed.  Avoid prolonged exposure to sunlight or tanning lamps while the skin adhesive is in place. Exposure to ultraviolet light in the first year will darken the scar.  The skin adhesive will usually remain in place for 5 to 10 days, then naturally fall off the skin. Do not pick at the adhesive film. You may need a tetanus shot if:  You cannot remember when you had your last tetanus shot.  You have never had a tetanus shot. If you get a tetanus shot, your arm may swell, get red, and feel warm to the touch. This is common and not a problem. If you need a tetanus shot and you choose not to have one, there is a rare chance of getting tetanus. Sickness from tetanus can be serious. SEEK MEDICAL CARE IF:   You have redness, swelling, or increasing pain in the  wound.  You see a red line that goes away from the wound.  You have yellowish-white fluid (pus) coming from the wound.  You have a fever.  You notice a bad smell coming from the wound or dressing.  Your wound breaks open before or after sutures have been removed.  You notice something coming out of the wound such as wood or glass.  Your wound is on your hand or foot and you cannot move a finger or toe. SEEK IMMEDIATE MEDICAL CARE IF:   Your pain is not controlled with prescribed medicine.  You have severe swelling around the wound causing pain and numbness or a change in color in your arm, hand, leg, or foot.  Your wound splits open and starts bleeding.  You have worsening numbness, weakness, or loss of function of any joint around or beyond the wound.  You develop painful lumps near the wound or on the skin anywhere on your body. MAKE SURE YOU:   Understand these instructions.  Will watch your condition.  Will get help right away if you are not doing well or get worse. Document Released: 06/06/2005 Document Revised: 08/29/2011 Document Reviewed: 11/30/2010 [facility] Patient Information 2015 [place], [place]. This information is not intended to replace advice given to you by your health care provider. Make sure you discuss any questions you have with your health care provider.  Chest Contusion A chest contusion is a deep bruise on your chest area. Contusions are the result of an injury that caused bleeding under the skin. A chest contusion may involve bruising of the skin, muscles, or ribs. The contusion may turn blue, purple, or yellow. Minor injuries will give you a painless contusion, but more severe contusions may stay painful and swollen for a few weeks. CAUSES  A contusion is usually caused by a blow, trauma, or direct force to an area of the body. SYMPTOMS   Swelling and redness of the injured area.  Discoloration of the injured area.  Tenderness and soreness of the  injured area.  Pain. DIAGNOSIS  The diagnosis can be made by taking a history and performing a physical exam. An X-ray, CT scan, or MRI may be needed to determine if there were any associated injuries, such as broken bones (fractures) or internal injuries. TREATMENT  Often, the best treatment for a chest contusion is resting,  icing, and applying cold compresses to the injured area. Deep breathing exercises may be recommended to reduce the risk of pneumonia. Over-the-counter medicines may also be recommended for pain control. HOME CARE INSTRUCTIONS   Put ice on the injured area.  Put ice in a plastic bag.  Place a towel between your skin and the bag.  Leave the ice on for 15-20 minutes, 03-04 times a day.  Only take over-the-counter or prescription medicines as directed by your caregiver. Your caregiver may recommend avoiding anti-inflammatory medicines (aspirin, ibuprofen, and naproxen) for 48 hours because these medicines may increase bruising.  Rest the injured area.  Perform deep-breathing exercises as directed by your caregiver.  Stop smoking if you smoke.  Do not lift objects over 5 pounds (2.3 kg) for 3 days or longer if recommended by your caregiver. SEEK IMMEDIATE MEDICAL CARE IF:   You have increased bruising or swelling.  You have pain that is getting worse.  You have difficulty breathing.  You have dizziness, weakness, or fainting.  You have blood in your urine or stool.  You cough up or vomit blood.  Your swelling or pain is not relieved with medicines. MAKE SURE YOU:   Understand these instructions.  Will watch your condition.  Will get help right away if you are not doing well or get worse. Document Released: 03/01/2001 Document Revised: 02/29/2012 Document Reviewed: 11/28/2011 [facility] Patient Information 2015 [place], [place]. This information is not intended to replace advice given to you by your health care provider. Make sure you discuss any  questions you have with your health care provider.

## 2015-01-27 NOTE — ED Notes (Signed)
Per [facility] pt was sleeping and assumed rolled out of bed and fell to floor. Came from [facility]. 12-lead ECG  Head pain small laceration above R eye Neck pain new onset Thosatic pain upon palpation R flank pain R shin pain  Denies LOC Lungs clear and equal Neg stroke scale A&O x4. No blood thinners 190/92 BP 68 HR 16 R 107 CBG Eyes PERL CMS intact prior and post C-collar and KED application

## 2015-01-27 NOTE — Telephone Encounter (Signed)
Talked with [name] at [facility]. Needs an order for evaluation for PT - pt fell today and has laceration on head and needed sutures. Hilda Blades Shavonne Ambroise RN 01/27/15 4:20PM

## 2015-01-27 NOTE — Telephone Encounter (Signed)
Calling requesting orders for evaluation for physical therapy.

## 2015-01-28 NOTE — Telephone Encounter (Signed)
[name] aware of Dr Eppie Gibson Zachary [name] - will call clinic if further orders are needed.

## 2015-02-03 ENCOUNTER — Ambulatory Visit (INDEPENDENT_AMBULATORY_CARE_PROVIDER_SITE_OTHER): Payer: Medicare Other | Admitting: Internal Medicine

## 2015-02-03 ENCOUNTER — Encounter: Payer: Self-pay | Admitting: Internal Medicine

## 2015-02-03 VITALS — BP 137/65 | HR 70 | Temp 98.5°F | Ht 66.5 in | Wt 196.3 lb

## 2015-02-03 DIAGNOSIS — F332 Major depressive disorder, recurrent severe without psychotic features: Secondary | ICD-10-CM

## 2015-02-03 DIAGNOSIS — W19XXXD Unspecified fall, subsequent encounter: Secondary | ICD-10-CM

## 2015-02-03 DIAGNOSIS — K5909 Other constipation: Secondary | ICD-10-CM

## 2015-02-03 DIAGNOSIS — S0181XD Laceration without foreign body of other part of head, subsequent encounter: Secondary | ICD-10-CM | POA: Diagnosis not present

## 2015-02-03 DIAGNOSIS — K5903 Drug induced constipation: Secondary | ICD-10-CM

## 2015-02-03 MED ORDER — SERTRALINE HCL 50 MG PO TABS: 150.0000 mg | ORAL_TABLET | Freq: Every day | ORAL | Status: DC

## 2015-02-03 MED ORDER — SORBITOL 70 % PO SOLN: 30.0000 mL | Freq: Every day | ORAL | Status: DC | PRN

## 2015-02-03 NOTE — Patient Instructions (Signed)
Zachary [name] it was nice meeting you today. You have a follow up visit with Dr. Eppie Gibson in 1 month.   Medications:  Please take Sorbitol 70% solution 1 tablespoon (30 ml) by mouth everyday. Continue taking Sennna and Miralax.  Take Sertraline 50 mg 3 tablets (150 mg) by mouth once daily.

## 2015-02-04 ENCOUNTER — Telehealth: Payer: Self-pay | Admitting: Internal Medicine

## 2015-02-04 DIAGNOSIS — S0181XA Laceration without foreign body of other part of head, initial encounter: Secondary | ICD-10-CM | POA: Insufficient documentation

## 2015-02-04 NOTE — Assessment & Plan Note (Addendum)
Patient continues to have diffuse abdominal pain which is associated with nausea and constipation. He continues to have some bowel movements and is passing gas. As such, bowel obstruction is less likely to be the etiology of his chronic abdominal pain. Physical exam revealed hyperactive bowel sounds, distention, diffuse tenderness, and guarding.  -Patient started on Sorbitol 1 tablespoon every day. Continue Senna and MiraLAX. -Encouraged patient to increase his dietary fiber intake and to stay hydrated. -Follow up in 1 month.

## 2015-02-04 NOTE — Telephone Encounter (Signed)
I returned call to [place], [facility] RN. HHN has made 4 attempts to establish with patient for nursing and physical therapy.  He has declined both.   Nursing was doing a f/u on a fall that resulted in a laceration.  Pt seen in our clinic yesterday and sutures were removed.   Home health will not be seeing pt. For nursing or PT.

## 2015-02-04 NOTE — Telephone Encounter (Signed)
Noted! Thank you

## 2015-02-04 NOTE — Assessment & Plan Note (Signed)
Patient reports he was recently moved to a new room at his assisted living facility. He has a new roommate and the window does not have a good view. As such, he might be feeling isolated. These new life stressors are likely making his anxiety and depression worse. -Increased dose of sertraline to 150 mg daily -Patient has a follow-up visit in one month. Reassess to see if he is still feeling depressed and/or anxious.

## 2015-02-04 NOTE — Assessment & Plan Note (Signed)
Patient has a 4 cm laceration to the right side of his forehead status post fall on 01/27/2015. The laceration was sutured in the ED and patient is here today to get the sutures removed. He only has some mild tenderness at the site. 3 sutures were in place and the site was intact. There was presence of some dried blood but no pus/drainage. -A total of 3 sutures removed today. The site is now clean, dry, and intact.

## 2015-02-04 NOTE — Telephone Encounter (Signed)
Zachary [name] from [facility] called to states pt decline home health services and not admit to home health.

## 2015-02-04 NOTE — Progress Notes (Signed)
Subjective:   Patient ID: Zachary [name] male   DOB: 04-Jul-1929 81 y.o.   MRN: [id number]  HPI: Zachary [name] is a 81 y.o. with a past medical history of chronic pain disorder, depression, anxiety, hypertension, and CKD presenting to the clinic today for an ED follow-up. He was in the ED on 01/27/2015 after a fall with head and chest trauma. CT of head/cervical spine and x-ray of ribs were unremarkable. He had a 4 cm laceration to the right side of his forehead which was sutured in the ED. Patient states he is here today to get his sutures removed. He still has some mild pain at the site of the laceration and pain in the middle of the forehead. Denies any focal neurological symptoms such as weakness, numbness, or paralysis. Patient has a history of unstable gait. States he uses a cane to walk and does not want physical therapy. He also has a history of chronic abdominal pain and and continues to complain of pain in the abdomen. States he is nauseous sometimes but denies vomiting or diarrhea. He also has chronic constipation for which he takes Miralax. In addition, he is taking Omeprazole 20 mg daily for GERD. He is continuing to have some bowel movements and is passing gas.  Past Medical History  Diagnosis Date  . Chronic pain disorder 06/22/2006  . Major depression 06/22/2006  . Anxiety 06/22/2006  . Essential hypertension 06/22/2006  . Gastroesophageal reflux disease with stricture 06/22/2006    With esophagitis, requiring dilatation 08/02/2006   . Diverticulosis of colon 06/22/2006  . Benign prostatic hypertrophy with urinary obstruction 06/22/2006  . Arthritis of hip 05/21/2010    s/p left hip total arthroplasty   . Constipation due to pain medication 06/03/2011  . Gastritis 07/06/2012    Seen on EGD 08/02/2006   . Obesity (BMI 30.0-34.9) 07/06/2012  . Facial basal cell cancer 06/22/2006  . Squamous cell skin cancer 07/06/2012    In situ   . Irritable bowel syndrome   . Alcoholism     Remote  .  Cataract   . Osteoarthritis of left knee 03/07/2013  . Swelling of left lower extremity 12/07/2012    Responds to lasix 40 mg daily     . Chronic kidney disease, stage 3 11/08/2013   Current Outpatient Prescriptions  Medication Sig Dispense Refill  . amLODipine (NORVASC) 10 MG tablet Take 1 tablet (10 mg total) by mouth daily. 30 tablet 11  . artificial tears (LACRILUBE) OINT ophthalmic ointment Use as directed in left eye at bedtime 3.5 g 6  . aspirin 81 MG EC tablet Take 1 tablet (81 mg total) by mouth daily. Swallow whole. 90 tablet 3  . feeding supplement (ENSURE IMMUNE HEALTH) LIQD Take 237 mLs by mouth 3 (three) times daily with meals. 5688 mL 11  . finasteride (PROSCAR) 5 MG tablet Take 1 tablet (5 mg total) by mouth daily. 90 tablet 3  . fluticasone (FLONASE) 50 MCG/ACT nasal spray Place 2 sprays into both nostrils daily. 16 g 2  . furosemide (LASIX) 40 MG tablet Take 1 tablet (40 mg total) by mouth daily. 90 tablet 3  . ibuprofen (ADVIL,MOTRIN) 800 MG tablet Take 1 tablet (800 mg total) by mouth every 8 (eight) hours as needed. 30 tablet 11  . LORazepam (ATIVAN) 1 MG tablet Take 1 tablet (1 mg total) by mouth every 12 (twelve) hours. In applesauce, dosing to be supervised by caregiver 60 tablet 5  . methadone (DOLOPHINE) 5  MG tablet Take 1 tablet (5 mg total) by mouth every 8 (eight) hours. 90 tablet 0  . Multiple Vitamin (TAB-A-VITE) TABS Take 1 tablet by mouth daily. 30 tablet 11  . omeprazole (PRILOSEC) 20 MG capsule Take 1 capsule (20 mg total) by mouth daily. 30 capsule 6  . polyethylene glycol powder (GLYCOLAX/MIRALAX) powder Take 17 g by mouth daily as needed for severe constipation. 527 g 11  . protein supplement (PROMOD) POWD Take 24gm by mouth 3(three) times daily with meals. 454 g 11  . senna-docusate (SENOKOT-S) 8.6-50 MG per tablet Take 4 tablets by mouth at bedtime. 120 tablet 11  . sertraline (ZOLOFT) 50 MG tablet Take 3 tablets (150 mg total) by mouth daily. 90 tablet 3    . sorbitol 70 % solution Take 30 mLs by mouth daily as needed (Mix 1 tablespooon (30 ml) in coffee each morning.). 473 mL 2  . Tamsulosin HCl (FLOMAX) 0.4 MG CAPS Take 1 capsule (0.4 mg total) by mouth daily. 30 capsule 11  . traMADol (ULTRAM) 50 MG tablet Take 1 tablet (50 mg total) by mouth every 6 (six) hours as needed for moderate pain. 120 tablet 5   No current facility-administered medications for this visit.   Family History  Problem Relation Age of Onset  . Pneumonia Mother 79  . Heart attack Father 79  . Early death Brother     Specifics unknown  . Healthy Daughter   . Healthy Son   . Heart disease Brother     Specifics unknown  . Pancreatic cancer Sister   . Early death Sister 79    Hit by truck on [address]  . Healthy Daughter    Social History   Social History  . Marital Status: Widowed    Spouse Name: N/A  . Number of Children: N/A  . Years of Education: N/A   Social History Main Topics  . Smoking status: Former Smoker -- 1.00 packs/day for 25 years    Quit date: 06/20/1978  . Smokeless tobacco: Never Used  . Alcohol Use: No  . Drug Use: No  . Sexual Activity: Not Asked   Other Topics Concern  . None   Social History Narrative   Zachary [name] has been divorced for decades. He has 2 adult children probably in [place] with no contact.  Had a sig other who died years.  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  Maintains some contact with his sister and some nephews, but seems to have a lonely existence.  He lives in an assisting living facility in [place].  History has included smoking and periods of drinking (details of this are unclear)   Review of Systems: Review of Systems  Constitutional: Negative for fever and chills.  HENT: Negative for ear discharge and ear pain.   Eyes: Negative for blurred vision and pain.  Respiratory: Negative for cough, shortness of breath  and wheezing.   Cardiovascular: Negative for chest pain and leg swelling.  Gastrointestinal: Positive for heartburn, nausea, abdominal pain and constipation. Negative for vomiting and diarrhea.  Genitourinary: Negative for dysuria, urgency and frequency.  Skin: Negative for itching and rash.  Neurological: Negative for dizziness, tingling, sensory change and focal weakness.    Objective:  Physical Exam: Filed Vitals:   02/03/15 1110  BP: 137/65  Pulse: 70  Temp: 98.5 F (36.9 C)  TempSrc: Oral  Height: 5' 6.5" (  1.689 m)  Weight: 196 lb 4.8 oz (89.041 kg)  SpO2: 100%   Physical Exam  Constitutional: He is oriented to person, place, and time and well-developed, well-nourished, and in no distress. No distress.  HENT:  Head: Normocephalic.  Eyes: EOM are normal. Pupils are equal, round, and reactive to light.  Neck: Neck supple. No tracheal deviation present.  Cardiovascular: Normal rate, regular rhythm and intact distal pulses.   Pulmonary/Chest: Effort normal and breath sounds normal. No respiratory distress. He has no wheezes. He has no rales.  Abdominal: Soft. He exhibits distension. There is tenderness. There is guarding.  Hyperactive bowel sounds Diffuse abdominal tenderness  Musculoskeletal: He exhibits no edema.  Neurological: He is alert and oriented to person, place, and time. No cranial nerve deficit.  Strength and sensation grossly intact in bilateral upper and lower extremities.  Skin: Skin is warm and dry.  4 cm laceration on R side of forehead above the eyebrow. 3 sutures in place and the site is intact. Presence of some dried blood. No pus or drainage.    Assessment & Plan:

## 2015-02-05 ENCOUNTER — Other Ambulatory Visit: Payer: Self-pay | Admitting: Internal Medicine

## 2015-02-05 MED ORDER — SORBITOL 70 % PO SOLN: 15.0000 mL | Freq: Every day | ORAL | Status: DC | PRN

## 2015-02-05 NOTE — Addendum Note (Signed)
Addended by: Oval Linsey D on: 02/05/2015 02:59 PM   Modules accepted: Medications

## 2015-02-05 NOTE — Progress Notes (Signed)
I saw and evaluated the patient.  I personally confirmed the key portions of Dr. Elon Jester history and exam and reviewed pertinent patient test results.  The assessment, diagnosis, and plan were formulated together and I agree with the documentation in the resident's note with the following exception:  I found the bowel sounds to be normoactive.

## 2015-02-17 ENCOUNTER — Other Ambulatory Visit: Payer: Self-pay | Admitting: *Deleted

## 2015-02-17 DIAGNOSIS — G894 Chronic pain syndrome: Secondary | ICD-10-CM

## 2015-02-17 MED ORDER — METHADONE HCL 5 MG PO TABS: 5.0000 mg | ORAL_TABLET | Freq: Three times a day (TID) | ORAL | Status: DC

## 2015-02-17 NOTE — Telephone Encounter (Signed)
#  3 filled 8/3

## 2015-02-17 NOTE — Telephone Encounter (Signed)
Have faxed these and put them in the mail, confirmed rec'ing at [facility]

## 2015-02-26 ENCOUNTER — Encounter: Payer: Self-pay | Admitting: Internal Medicine

## 2015-02-26 DIAGNOSIS — I700 Atherosclerosis of aorta: Secondary | ICD-10-CM

## 2015-02-26 HISTORY — DX: Atherosclerosis of aorta: I70.0

## 2015-03-13 ENCOUNTER — Ambulatory Visit: Payer: Medicare Other | Admitting: Internal Medicine

## 2015-03-16 ENCOUNTER — Telehealth: Payer: Self-pay | Admitting: *Deleted

## 2015-03-16 NOTE — Telephone Encounter (Signed)
Clinic was faxed a request from [facility]  - request methadone. Called [place]  - they will send second Rx to [facility] - not to be filled till 03/21/15. Rx was dated 02/17/15. Hilda Blades Ditzler RN 03/16/15 1:45PM

## 2015-03-18 ENCOUNTER — Ambulatory Visit: Payer: Medicare Other | Admitting: Internal Medicine

## 2015-03-19 ENCOUNTER — Ambulatory Visit (INDEPENDENT_AMBULATORY_CARE_PROVIDER_SITE_OTHER): Payer: Medicare Other | Admitting: Internal Medicine

## 2015-03-19 ENCOUNTER — Encounter: Payer: Self-pay | Admitting: Internal Medicine

## 2015-03-19 VITALS — BP 119/73 | HR 84 | Temp 98.0°F | Ht 67.0 in | Wt 197.1 lb

## 2015-03-19 DIAGNOSIS — Z87891 Personal history of nicotine dependence: Secondary | ICD-10-CM | POA: Diagnosis not present

## 2015-03-19 DIAGNOSIS — G894 Chronic pain syndrome: Secondary | ICD-10-CM

## 2015-03-19 DIAGNOSIS — R1011 Right upper quadrant pain: Secondary | ICD-10-CM | POA: Diagnosis not present

## 2015-03-19 DIAGNOSIS — W19XXXS Unspecified fall, sequela: Secondary | ICD-10-CM

## 2015-03-19 NOTE — Assessment & Plan Note (Addendum)
He takes tramadol 50 mg q6h, and methadone 5 mg every 8 hours for his chronic pain. He says that both medicines may not be working enough and he may need a higher dose in future.  He was very frustrated with his living situation and felt that his room-mate was being obnoxious and he gets headaches because of that- he moved there for 2 months.  He complained of right sided abdominal pain- it seemed like a muscular spasm when palpated. There could possibly be some fluid present, but it was difficult to tell due to the patient's body habitus. Also, a ventral hernia was present. He denies n/v/d/c, and has normal bowel movements. He is able to tolerate regular diet normally. He has no constitutional symptoms and denies any weight loss.   Plan CBC, CMP to check liver enzymes Ordered ultrasound of abdomen  --continue methadone and tramadol at current dose - Asked Ms. Jeralyn Ruths for assistance in finding him another placement.

## 2015-03-19 NOTE — Patient Instructions (Signed)
Thanks for your visit today Please follow up with Dr. Eppie Gibson in 3 months Ms. Zachary [name]- our social worker will call you to help with the finding a new place

## 2015-03-19 NOTE — Progress Notes (Signed)
Patient ID: Zachary [name], male   DOB: 1930-01-30, 81 y.o.   MRN: [id number]    Subjective:   Patient ID: Zachary [name] male   DOB: May 09, 1930 81 y.o.   MRN: [id number]  HPI: Zachary [name] is a 81 y.o. man with history of chronic pain syndrome, drpesssion, HTN, history of fall leading to alceration of the head few weeks back, here to discuss his chronic pain syndrome, and to discuss his living situation.  Please see problem list/A&P for the status of the patient's chronic medical problems.    Past Medical History  Diagnosis Date  . Chronic pain disorder 06/22/2006  . Major depression 06/22/2006  . Anxiety 06/22/2006  . Essential hypertension 06/22/2006  . Gastroesophageal reflux disease with stricture 06/22/2006    With esophagitis, requiring dilatation 08/02/2006   . Diverticulosis of colon 06/22/2006  . Benign prostatic hypertrophy with urinary obstruction 06/22/2006  . Arthritis of hip 05/21/2010    s/p left hip total arthroplasty   . Constipation due to pain medication 06/03/2011  . Gastritis 07/06/2012    Seen on EGD 08/02/2006   . Obesity (BMI 30.0-34.9) 07/06/2012  . Facial basal cell cancer 06/22/2006  . Squamous cell skin cancer 07/06/2012    In situ   . Irritable bowel syndrome   . Alcoholism     Remote  . Cataract   . Osteoarthritis of left knee 03/07/2013  . Swelling of left lower extremity 12/07/2012    Responds to lasix 40 mg daily     . Chronic kidney disease, stage 3 11/08/2013  . Aortic atherosclerosis 02/26/2015    Seen on CT scan, currently asymptomatic   Current Outpatient Prescriptions  Medication Sig Dispense Refill  . amLODipine (NORVASC) 10 MG tablet Take 1 tablet (10 mg total) by mouth daily. 30 tablet 11  . artificial tears (LACRILUBE) OINT ophthalmic ointment Use as directed in left eye at bedtime 3.5 g 6  . aspirin 81 MG EC tablet Take 1 tablet (81 mg total) by mouth daily. Swallow whole. 90 tablet 3  . feeding supplement (ENSURE IMMUNE HEALTH) LIQD Take 237 mLs  by mouth 3 (three) times daily with meals. 5688 mL 11  . finasteride (PROSCAR) 5 MG tablet Take 1 tablet (5 mg total) by mouth daily. 90 tablet 3  . fluticasone (FLONASE) 50 MCG/ACT nasal spray Place 2 sprays into both nostrils daily. 16 g 2  . furosemide (LASIX) 40 MG tablet Take 1 tablet (40 mg total) by mouth daily. 90 tablet 3  . ibuprofen (ADVIL,MOTRIN) 800 MG tablet Take 1 tablet (800 mg total) by mouth every 8 (eight) hours as needed. 30 tablet 11  . LORazepam (ATIVAN) 1 MG tablet Take 1 tablet (1 mg total) by mouth every 12 (twelve) hours. In applesauce, dosing to be supervised by caregiver 60 tablet 5  . methadone (DOLOPHINE) 5 MG tablet Take 1 tablet (5 mg total) by mouth every 8 (eight) hours. 90 tablet 0  . Multiple Vitamin (TAB-A-VITE) TABS Take 1 tablet by mouth daily. 30 tablet 11  . omeprazole (PRILOSEC) 20 MG capsule Take 1 capsule (20 mg total) by mouth daily. 30 capsule 6  . polyethylene glycol powder (GLYCOLAX/MIRALAX) powder Take 17 g by mouth daily as needed for severe constipation. 527 g 11  . protein supplement (PROMOD) POWD Take 24gm by mouth 3(three) times daily with meals. 454 g 11  . senna-docusate (SENOKOT-S) 8.6-50 MG per tablet Take 4 tablets by mouth at bedtime. 120 tablet 11  .  sertraline (ZOLOFT) 50 MG tablet Take 3 tablets (150 mg total) by mouth daily. 90 tablet 3  . sorbitol 70 % solution Take 15 mLs by mouth daily as needed (Mix 1 tablespooon (15 ml) in coffee each morning.). 473 mL 2  . Tamsulosin HCl (FLOMAX) 0.4 MG CAPS Take 1 capsule (0.4 mg total) by mouth daily. 30 capsule 11  . traMADol (ULTRAM) 50 MG tablet Take 1 tablet (50 mg total) by mouth every 6 (six) hours as needed for moderate pain. 120 tablet 5   No current facility-administered medications for this visit.   Family History  Problem Relation Age of Onset  . Pneumonia Mother 72  . Heart attack Father 10  . Early death Brother     Specifics unknown  . Healthy Daughter   . Healthy Son   .  Heart disease Brother     Specifics unknown  . Pancreatic cancer Sister   . Early death Sister 3    Hit by truck on [address]  . Healthy Daughter    Social History   Social History  . Marital Status: Widowed    Spouse Name: N/A  . Number of Children: N/A  . Years of Education: N/A   Social History Main Topics  . Smoking status: Former Smoker -- 1.00 packs/day for 25 years    Quit date: 06/20/1978  . Smokeless tobacco: Never Used  . Alcohol Use: No  . Drug Use: No  . Sexual Activity: Not Asked   Other Topics Concern  . None   Social History Narrative   Zachary [name] has been divorced for decades. He has 2 adult children probably in [place] with no contact.  Had a sig other who died years.  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  Maintains some contact with his sister and some nephews, but seems to have a lonely existence.  He lives in an assisting living facility in [place].  History has included smoking and periods of drinking (details of this are unclear)   Review of Systems: Review of Systems  Constitutional: Negative for fever.  Respiratory: Negative.  Negative for cough, sputum production, shortness of breath and wheezing.   Cardiovascular: Negative for chest pain, palpitations, claudication, leg swelling and PND.  Gastrointestinal: Positive for abdominal pain. Negative for nausea, vomiting, diarrhea, constipation, blood in stool and melena.  Genitourinary: Negative for dysuria.  Neurological: Negative.  Negative for dizziness, sensory change, focal weakness, loss of consciousness and headaches.  Psychiatric/Behavioral: The patient is nervous/anxious.     Objective:  Physical Exam: Filed Vitals:   03/19/15 1408  BP: 119/73  Pulse: 84  Temp: 98 F (36.7 C)  TempSrc: Oral  Height: '5\' 7"'$  (1.702 m)  Weight: 197 lb 1.6 oz (89.404 kg)  SpO2: 97%   Physical Exam    Constitutional: He is oriented to person, place, and time. He appears well-developed and well-nourished.  HENT:  Head: Normocephalic and atraumatic.  Cardiovascular: Normal rate, regular rhythm, normal heart sounds and intact distal pulses.   No murmur heard. Pulmonary/Chest: Effort normal and breath sounds normal. No respiratory distress. He has no wheezes.  Abdominal: Soft. Bowel sounds are normal. He exhibits no distension.  Seems to have a ventral hernia  On laying down, her right side abdomen was somewhat tender to palpation, and possibly there could be some fluid present   Neurological: He is alert and oriented to  person, place, and time. He has normal strength and normal reflexes. He displays no tremor. No cranial nerve deficit or sensory deficit. He exhibits normal muscle tone. Coordination normal.  Skin: Skin is warm.     Assessment & Plan:   Please see problem based charting for assessment and plan

## 2015-03-20 LAB — CMP14 + ANION GAP
A/G RATIO: 1.6 (ref 1.1–2.5)
ALK PHOS: 142 IU/L — AB (ref 39–117)
ALT: 16 IU/L (ref 0–44)
AST: 24 IU/L (ref 0–40)
Albumin: 4.4 g/dL (ref 3.5–4.7)
Anion Gap: 20 mmol/L — ABNORMAL HIGH (ref 10.0–18.0)
BILIRUBIN TOTAL: 0.2 mg/dL (ref 0.0–1.2)
BUN/Creatinine Ratio: 22 (ref 10–22)
BUN: 33 mg/dL — ABNORMAL HIGH (ref 8–27)
CHLORIDE: 99 mmol/L (ref 97–108)
CO2: 22 mmol/L (ref 18–29)
Calcium: 9 mg/dL (ref 8.6–10.2)
Creatinine, Ser: 1.49 mg/dL — ABNORMAL HIGH (ref 0.76–1.27)
GFR calc non Af Amer: 43 mL/min/{1.73_m2} — ABNORMAL LOW (ref >59)
GFR, EST AFRICAN AMERICAN: 49 mL/min/{1.73_m2} — AB (ref >59)
GLUCOSE: 104 mg/dL — AB (ref 65–99)
Globulin, Total: 2.7 g/dL (ref 1.5–4.5)
POTASSIUM: 4.7 mmol/L (ref 3.5–5.2)
Sodium: 141 mmol/L (ref 134–144)
TOTAL PROTEIN: 7.1 g/dL (ref 6.0–8.5)

## 2015-03-20 LAB — CBC
HEMATOCRIT: 41.1 % (ref 37.5–51.0)
HEMOGLOBIN: 14.1 g/dL (ref 12.6–17.7)
MCH: 32.9 pg (ref 26.6–33.0)
MCHC: 34.3 g/dL (ref 31.5–35.7)
MCV: 96 fL (ref 79–97)
Platelets: 212 10*3/uL (ref 150–379)
RBC: 4.28 x10E6/uL (ref 4.14–5.80)
RDW: 13.4 % (ref 12.3–15.4)
WBC: 8.3 10*3/uL (ref 3.4–10.8)

## 2015-03-20 NOTE — Progress Notes (Signed)
Medicine attending: I personally interviewed and briefly examined this patient, and reviewed pertinent clinical laboratory  data  with resident physician Dr.Parth Tiburcio Pea  and we discussed a   management plan. It is not clear why this man is on chronic narcotic analgesics for vague, "total body" pain. Efforts should be directed at tapering narcotics and substituting with non narcotic analgesics. His abdomen is distended, tender diffusely, more in the RUQ, no definite fluid wave. We will get an abdominal ultrasound.

## 2015-03-23 ENCOUNTER — Telehealth: Payer: Self-pay | Admitting: Licensed Clinical Social Worker

## 2015-03-23 NOTE — Telephone Encounter (Signed)
Zachary [name] was referred to [facility] as pt is having issues with his roommate at [facility].  Pt requesting CSW to contact Zachary [name].  CSW placed call to Zachary [name], message left requesting return call.  If CSW unable to speak with Zachary [name], will refer Zachary [name] to the [facility].

## 2015-03-24 NOTE — Telephone Encounter (Signed)
CSW received return call from Scientist, physiological at [facility].  Mr. Brumett roommate issues are not uncommon.  Pt prefers a single room and has been successful in the past at "running" past roommates away.  Administrator is aware of current issue and Samule Dry has recently been to facility to provide an in-service to all residents.  Current situation is not one sided, both residents have not acted appropriately and facility is sending out letters to both.  Mr. Mcniel has been offered to meet with Ombudsman and is aware this resource is available but has declined to have a sit down meeting with Ombudsman.  Pt has also declined to meet with facility assigned DSS worker to find facility.  Facility states Mr. Milliron declines because these workers have been African-American men.  [name] has been actively working with Mr. Lipsky and states this is not a new concern, pt has been assigned to a 2 person room based on medicaid reimbursement.  Pt has been made aware of options and contacts with DSS and Ombudsman.   CSW will sign off.

## 2015-03-30 ENCOUNTER — Ambulatory Visit (HOSPITAL_COMMUNITY): Payer: Medicare Other

## 2015-04-14 ENCOUNTER — Other Ambulatory Visit: Payer: Self-pay | Admitting: *Deleted

## 2015-04-14 DIAGNOSIS — F419 Anxiety disorder, unspecified: Secondary | ICD-10-CM

## 2015-04-15 MED ORDER — LORAZEPAM 1 MG PO TABS: 1.0000 mg | ORAL_TABLET | Freq: Two times a day (BID) | ORAL | Status: DC

## 2015-04-16 ENCOUNTER — Other Ambulatory Visit: Payer: Self-pay | Admitting: *Deleted

## 2015-04-16 DIAGNOSIS — G894 Chronic pain syndrome: Secondary | ICD-10-CM

## 2015-04-16 NOTE — Telephone Encounter (Signed)
Called to pharm, [facility]

## 2015-04-16 NOTE — Telephone Encounter (Signed)
Time for next 3 scripts, will be due per pharm 11/3, next appt 1/6

## 2015-04-17 MED ORDER — METHADONE HCL 5 MG PO TABS: 5.0000 mg | ORAL_TABLET | Freq: Three times a day (TID) | ORAL | Status: DC

## 2015-04-17 NOTE — Telephone Encounter (Signed)
All 3 Rx faxed to [facility] and 3 Rx were mailed to [facility]. Hilda Blades Pierra Skora RN 04/17/15 3:30PM

## 2015-04-21 ENCOUNTER — Telehealth: Payer: Self-pay

## 2015-04-21 NOTE — Telephone Encounter (Signed)
Advised facility that rx for methadone were faxed and mailed to [facility] on 10/28

## 2015-04-24 ENCOUNTER — Telehealth: Payer: Self-pay | Admitting: *Deleted

## 2015-04-24 NOTE — Telephone Encounter (Signed)
Facility sends fax wanting to d'c the sorbitol because pt refuses to take it stating"it makes me sick every time i take it" may i call with a verbal and fax a written order? Ph [phone] Fax [phone]

## 2015-04-27 NOTE — Telephone Encounter (Signed)
Yes.  Please inform the facility they may discontinue the sorbitol 70%.  I have discontinued it within our medical record.  Thanks.

## 2015-04-28 NOTE — Telephone Encounter (Signed)
Faxed dr Caroline More response below to facility, tried to call to give verbal and only got vmail

## 2015-05-06 ENCOUNTER — Ambulatory Visit (INDEPENDENT_AMBULATORY_CARE_PROVIDER_SITE_OTHER): Payer: Medicare Other | Admitting: *Deleted

## 2015-05-06 ENCOUNTER — Telehealth: Payer: Self-pay | Admitting: *Deleted

## 2015-05-06 DIAGNOSIS — Z23 Encounter for immunization: Secondary | ICD-10-CM

## 2015-05-06 NOTE — Telephone Encounter (Signed)
Employee from [facility] called to check on last flu injection - 01/2014 by [employer]. Denies any head or chest congestion. Appt made with nurse for flu injection today. Hilda Blades Giovanna Kemmerer RN 05/06/15 11:55AM

## 2015-05-18 ENCOUNTER — Other Ambulatory Visit: Payer: Self-pay | Admitting: *Deleted

## 2015-05-18 ENCOUNTER — Other Ambulatory Visit: Payer: Self-pay | Admitting: Internal Medicine

## 2015-05-18 DIAGNOSIS — G894 Chronic pain syndrome: Secondary | ICD-10-CM

## 2015-05-18 MED ORDER — METHADONE HCL 5 MG PO TABS: 5.0000 mg | ORAL_TABLET | Freq: Three times a day (TID) | ORAL | Status: DC

## 2015-05-18 NOTE — Telephone Encounter (Signed)
Refilled as requested.  Previous Rx not accurate per pharmacy.

## 2015-05-18 NOTE — Telephone Encounter (Signed)
rec'd call concerning methadone, pharmacy gave during last request a fill date of 11/3, they then filled on 11/1, new script states do not fill until 11/3 as pharmacy stated, now they state they will not take a verbal to fill on 12/1- 2 days early stating they cannot do that, so to end the discussion could we print 3 scripts, voiding the 3 approved on 10/27 and ask for proof that they voided said scripts then send 3 new ones stating may fill 30 after last refill on each of the new ones? Please advise

## 2015-05-18 NOTE — Telephone Encounter (Signed)
Need 3  With do not fill until 30 days after last  Thank you

## 2015-06-03 ENCOUNTER — Telehealth: Payer: Self-pay | Admitting: *Deleted

## 2015-06-03 NOTE — Telephone Encounter (Signed)
[facility] request records on pneumonia 73 and 23 dates given. Immunization summary faxed to [phone]. Hilda Blades Chamara Dyck RN 06/03/15 11AM

## 2015-06-12 ENCOUNTER — Ambulatory Visit (HOSPITAL_COMMUNITY): Payer: Medicare Other

## 2015-06-26 ENCOUNTER — Ambulatory Visit: Payer: Medicare Other | Admitting: Internal Medicine

## 2015-06-26 ENCOUNTER — Encounter: Payer: Self-pay | Admitting: Internal Medicine

## 2015-07-09 ENCOUNTER — Telehealth: Payer: Self-pay | Admitting: Internal Medicine

## 2015-07-09 NOTE — Telephone Encounter (Signed)
Call to patient to confirm appointment for 07/09/14 at 1:15 lmtcb

## 2015-07-10 ENCOUNTER — Ambulatory Visit (HOSPITAL_COMMUNITY)
Admission: RE | Admit: 2015-07-10 | Discharge: 2015-07-10 | Disposition: A | Discharge status: Home / Self Care | Payer: Medicare Other | Source: Ambulatory Visit | Attending: Student in an Organized Health Care Education/Training Program | Admitting: Student in an Organized Health Care Education/Training Program

## 2015-07-10 ENCOUNTER — Encounter: Payer: Self-pay | Admitting: Internal Medicine

## 2015-07-10 ENCOUNTER — Ambulatory Visit (INDEPENDENT_AMBULATORY_CARE_PROVIDER_SITE_OTHER): Payer: Medicare Other | Admitting: Internal Medicine

## 2015-07-10 VITALS — BP 100/58 | HR 87 | Temp 98.1°F | Ht 67.0 in | Wt 194.1 lb

## 2015-07-10 DIAGNOSIS — R0602 Shortness of breath: Secondary | ICD-10-CM | POA: Insufficient documentation

## 2015-07-10 DIAGNOSIS — Z87891 Personal history of nicotine dependence: Secondary | ICD-10-CM | POA: Insufficient documentation

## 2015-07-10 DIAGNOSIS — J209 Acute bronchitis, unspecified: Secondary | ICD-10-CM

## 2015-07-10 DIAGNOSIS — I10 Essential (primary) hypertension: Secondary | ICD-10-CM | POA: Diagnosis not present

## 2015-07-10 DIAGNOSIS — R0989 Other specified symptoms and signs involving the circulatory and respiratory systems: Secondary | ICD-10-CM | POA: Insufficient documentation

## 2015-07-10 DIAGNOSIS — I700 Atherosclerosis of aorta: Secondary | ICD-10-CM | POA: Insufficient documentation

## 2015-07-10 DIAGNOSIS — R079 Chest pain, unspecified: Secondary | ICD-10-CM | POA: Insufficient documentation

## 2015-07-10 DIAGNOSIS — R05 Cough: Secondary | ICD-10-CM | POA: Diagnosis not present

## 2015-07-10 DIAGNOSIS — R059 Cough, unspecified: Secondary | ICD-10-CM

## 2015-07-10 LAB — BASIC METABOLIC PANEL
Anion gap: 11 (ref 5–15)
BUN: 24 mg/dL — ABNORMAL HIGH (ref 6–20)
CHLORIDE: 101 mmol/L (ref 101–111)
CO2: 26 mmol/L (ref 22–32)
Calcium: 9 mg/dL (ref 8.9–10.3)
Creatinine, Ser: 1.66 mg/dL — ABNORMAL HIGH (ref 0.61–1.24)
GFR calc non Af Amer: 37 mL/min — ABNORMAL LOW (ref >60)
GFR, EST AFRICAN AMERICAN: 42 mL/min — AB (ref >60)
GLUCOSE: 110 mg/dL — AB (ref 65–99)
Potassium: 3.4 mmol/L — ABNORMAL LOW (ref 3.5–5.1)
Sodium: 138 mmol/L (ref 135–145)

## 2015-07-10 LAB — CBC WITH DIFFERENTIAL/PLATELET
BASOS PCT: 0 %
Basophils Absolute: 0 10*3/uL (ref 0.0–0.1)
Eosinophils Absolute: 0.2 10*3/uL (ref 0.0–0.7)
Eosinophils Relative: 2 %
HEMATOCRIT: 39.6 % (ref 39.0–52.0)
Hemoglobin: 14 g/dL (ref 13.0–17.0)
LYMPHS PCT: 20 %
Lymphs Abs: 1.6 10*3/uL (ref 0.7–4.0)
MCH: 33.2 pg (ref 26.0–34.0)
MCHC: 35.4 g/dL (ref 30.0–36.0)
MCV: 93.8 fL (ref 78.0–100.0)
MONOS PCT: 9 %
Monocytes Absolute: 0.7 10*3/uL (ref 0.1–1.0)
NEUTROS ABS: 5.5 10*3/uL (ref 1.7–7.7)
NEUTROS PCT: 69 %
Platelets: 182 10*3/uL (ref 150–400)
RBC: 4.22 MIL/uL (ref 4.22–5.81)
RDW: 12.8 % (ref 11.5–15.5)
WBC: 8 10*3/uL (ref 4.0–10.5)

## 2015-07-10 MED ORDER — GUAIFENESIN ER 600 MG PO TB12: 600.0000 mg | ORAL_TABLET | Freq: Two times a day (BID) | ORAL | Status: DC | PRN

## 2015-07-10 NOTE — Patient Instructions (Addendum)
1. [name], it looks like you have a viral bronchitis.  You do not need an antibiotic for this condition.  Please be sure to drink enough fluids.  You can take mucinex if needed for cough.  Please come back to clinic or seek emergency help if you have problems breathing, fever or if cough gets worse.  I would like you to stop your lasix for now until you come back to get labwork.   2. Please take all medications as prescribed.    3. If you have worsening of your symptoms or new symptoms arise, please call the clinic ([phone]), or go to the ER immediately if symptoms are severe.   Come back to see Dr. Eppie Gibson and check labs in 1-2 weeks.

## 2015-07-10 NOTE — Progress Notes (Signed)
Subjective:    Patient ID: MALAKAI SCHOENHERR, male    DOB: 1930/03/10, 82 y.o.   MRN: [id number]  HPI Comments: Mr. Symes is an 82 year old male with PMH as below here with c/o cough and not feeling well x 2 weeks.  Cough This is a new problem. The current episode started 1 to 4 weeks ago. The problem has been gradually improving. The cough is productive of sputum. Associated symptoms include chills, headaches, myalgias, nasal congestion, rhinorrhea and a sore throat. Pertinent negatives include no ear congestion, ear pain, fever, heartburn, hemoptysis, postnasal drip or shortness of breath. Risk factors: quit smoking 60 years. He has tried nothing for the symptoms. There is no history of asthma, bronchitis, COPD, environmental allergies or pneumonia.     Past Medical History  Diagnosis Date  . Chronic pain disorder 06/22/2006  . Major depression 06/22/2006  . Anxiety 06/22/2006  . Essential hypertension 06/22/2006  . Gastroesophageal reflux disease with stricture 06/22/2006    With esophagitis, requiring dilatation 08/02/2006   . Diverticulosis of colon 06/22/2006  . Benign prostatic hypertrophy with urinary obstruction 06/22/2006  . Arthritis of hip 05/21/2010    s/p left hip total arthroplasty   . Constipation due to pain medication 06/03/2011  . Gastritis 07/06/2012    Seen on EGD 08/02/2006   . Obesity (BMI 30.0-34.9) 07/06/2012  . Facial basal cell cancer 06/22/2006  . Squamous cell skin cancer 07/06/2012    In situ   . Irritable bowel syndrome   . Alcoholism     Remote  . Cataract   . Osteoarthritis of left knee 03/07/2013  . Swelling of left lower extremity 12/07/2012    Responds to lasix 40 mg daily     . Chronic kidney disease, stage 3 11/08/2013  . Aortic atherosclerosis 02/26/2015    Seen on CT scan, currently asymptomatic   Current Outpatient Prescriptions on File Prior to Visit  Medication Sig Dispense Refill  . amLODipine (NORVASC) 10 MG tablet Take 1 tablet (10 mg total) by mouth  daily. 30 tablet 11  . artificial tears (LACRILUBE) OINT ophthalmic ointment Use as directed in left eye at bedtime 3.5 g 6  . aspirin 81 MG EC tablet Take 1 tablet (81 mg total) by mouth daily. Swallow whole. 90 tablet 3  . feeding supplement (ENSURE IMMUNE HEALTH) LIQD Take 237 mLs by mouth 3 (three) times daily with meals. 5688 mL 11  . finasteride (PROSCAR) 5 MG tablet Take 1 tablet (5 mg total) by mouth daily. 90 tablet 3  . fluticasone (FLONASE) 50 MCG/ACT nasal spray Place 2 sprays into both nostrils daily. 16 g 2  . furosemide (LASIX) 40 MG tablet Take 1 tablet (40 mg total) by mouth daily. 90 tablet 3  . ibuprofen (ADVIL,MOTRIN) 800 MG tablet Take 1 tablet (800 mg total) by mouth every 8 (eight) hours as needed. 30 tablet 11  . LORazepam (ATIVAN) 1 MG tablet Take 1 tablet (1 mg total) by mouth every 12 (twelve) hours. In applesauce, dosing to be supervised by caregiver 60 tablet 5  . methadone (DOLOPHINE) 5 MG tablet Take 1 tablet (5 mg total) by mouth every 8 (eight) hours. 90 tablet 0  . Multiple Vitamin (TAB-A-VITE) TABS Take 1 tablet by mouth daily. 30 tablet 11  . omeprazole (PRILOSEC) 20 MG capsule Take 1 capsule (20 mg total) by mouth daily. 30 capsule 6  . polyethylene glycol powder (GLYCOLAX/MIRALAX) powder Take 17 g by mouth daily as needed for  severe constipation. 527 g 11  . protein supplement (PROMOD) POWD Take 24gm by mouth 3(three) times daily with meals. 454 g 11  . senna-docusate (SENOKOT-S) 8.6-50 MG per tablet Take 4 tablets by mouth at bedtime. 120 tablet 11  . sertraline (ZOLOFT) 50 MG tablet Take 3 tablets (150 mg total) by mouth daily. 90 tablet 3  . Tamsulosin HCl (FLOMAX) 0.4 MG CAPS Take 1 capsule (0.4 mg total) by mouth daily. 30 capsule 11  . traMADol (ULTRAM) 50 MG tablet Take 1 tablet (50 mg total) by mouth every 6 (six) hours as needed for moderate pain. 120 tablet 5   No current facility-administered medications on file prior to visit.    Review of  Systems  Constitutional: Positive for chills, appetite change and fatigue. Negative for fever.  HENT: Positive for rhinorrhea and sore throat. Negative for ear pain and postnasal drip.   Respiratory: Positive for cough. Negative for hemoptysis and shortness of breath.   Gastrointestinal: Positive for nausea. Negative for heartburn, vomiting, diarrhea, constipation and blood in stool.  Genitourinary: Negative for dysuria and difficulty urinating.  Musculoskeletal: Positive for myalgias. Negative for neck pain and neck stiffness.  Allergic/Immunologic: Negative for environmental allergies.  Neurological: Positive for light-headedness and headaches. Negative for syncope and weakness.       Lightheaded 2-3x in the past week.         Filed Vitals:   07/10/15 1319  BP: 100/58  Pulse: 87  Temp: 98.1 F (36.7 C)  TempSrc: Oral  Height: '5\' 7"'$  (1.702 m)  Weight: 194 lb 1.6 oz (88.043 kg)  SpO2: 100%     Objective:   Physical Exam  Constitutional: He is oriented to person, place, and time. He appears well-developed. No distress.  HENT:  Head: Normocephalic and atraumatic.  Right Ear: External ear normal.  Left Ear: External ear normal.  Mouth/Throat: Oropharynx is clear and moist. No oropharyngeal exudate.  No sinus tenderness.  Eyes: Conjunctivae and EOM are normal. Pupils are equal, round, and reactive to light. Right eye exhibits no discharge. Left eye exhibits no discharge. No scleral icterus.  Neck: Normal range of motion. Neck supple.  Cardiovascular: Normal rate, regular rhythm and normal heart sounds.  Exam reveals no gallop and no friction rub.   No murmur heard. Pulmonary/Chest: Effort normal. No respiratory distress. He has no wheezes. He has rales.  Few crackles LLL; no resp distress; no accessory muscle use  Abdominal: Soft. Bowel sounds are normal. There is no tenderness. There is no rebound and no guarding.  + epigastric hernia  Musculoskeletal: Normal range of motion.  He exhibits no edema or tenderness.  Lymphadenopathy:    He has no cervical adenopathy.  Neurological: He is alert and oriented to person, place, and time. No cranial nerve deficit.  Skin: Skin is warm. He is not diaphoretic.  Psychiatric: He has a normal mood and affect. His behavior is normal. Judgment and thought content normal.  Vitals reviewed.         Assessment & Plan:  Please see problem based charting for A&P.

## 2015-07-13 DIAGNOSIS — J209 Acute bronchitis, unspecified: Secondary | ICD-10-CM | POA: Insufficient documentation

## 2015-07-13 NOTE — Assessment & Plan Note (Signed)
BP Readings from Last 3 Encounters:  07/10/15 100/58  03/19/15 119/73  02/03/15 137/65    Lab Results  Component Value Date   NA 138 07/10/2015   K 3.4* 07/10/2015   CREATININE 1.66* 07/10/2015    Assessment: Blood pressure control:  controlled Progress toward BP goal:   at goal Comments: He does not know the names of his medications but says he takes all the medications that they give him at the ALF.  His BP is on the low end initially.  Upon recheck and orthostatics it has improved to SBP in the 120s.  He is not orthostatic in clinic.  Plan: Medications:  continue current medications:  Amlodipine '10mg'$  daily.  STOP Lasix '40mg'$  daily for now given lightheadedness and likely decreased po in the setting of acute illness (Cr is also up). Educational resources provided:   Self management tools provided:   Other plans: I have sent a note to his ALF asking to hold Lasix for now and he will return in 1-2 weeks for follow-up and lab work before resuming Lasix.

## 2015-07-13 NOTE — Assessment & Plan Note (Addendum)
Assessment:  Patient with overall c/o of not feeling well and specifically cough and URI symptoms in the past two weeks.  His ALF roommate also has a cough.  He feels cough is improving.  There seemed to be crackles at the left lung base, however CXR is negative for pneumonia or other acute cardiopulmonary issues, CBC is completely normal and VSS (afebrile, no tachy) so I doubt pneumonia.  He is up to date on flu and pneumonia vaccines.  Given VSS, neg CXR and he feels symptoms are improving, I suspect there is viral etiology and will treat symptomatically. Plan:  Symptomatic treatment - fluids, mucinex prn for cough.  He will return to clinic in 1-2 weeks for follow-up.  He will return sooner or seek emergency help if he develops worsening symptoms, fever or dyspnea.

## 2015-07-13 NOTE — Progress Notes (Signed)
Internal Medicine Clinic Attending  Case discussed with Dr. Redmond Pulling at the time of the visit.  We reviewed the resident's history and exam and pertinent patient test results.  I agree with the assessment, diagnosis, and plan of care documented in the resident's note.

## 2015-07-21 ENCOUNTER — Ambulatory Visit (INDEPENDENT_AMBULATORY_CARE_PROVIDER_SITE_OTHER): Payer: Medicare Other | Admitting: Internal Medicine

## 2015-07-21 ENCOUNTER — Encounter: Payer: Self-pay | Admitting: Internal Medicine

## 2015-07-21 VITALS — BP 124/59 | HR 81 | Temp 98.4°F | Ht 67.0 in | Wt 194.8 lb

## 2015-07-21 DIAGNOSIS — J209 Acute bronchitis, unspecified: Secondary | ICD-10-CM

## 2015-07-21 DIAGNOSIS — R1084 Generalized abdominal pain: Secondary | ICD-10-CM

## 2015-07-21 DIAGNOSIS — N179 Acute kidney failure, unspecified: Secondary | ICD-10-CM | POA: Diagnosis present

## 2015-07-21 DIAGNOSIS — G894 Chronic pain syndrome: Secondary | ICD-10-CM | POA: Diagnosis not present

## 2015-07-21 NOTE — Patient Instructions (Signed)
1.  I am glad your cough is better.  I will let you know if there are problems with your labs.  Please follow-up with Dr. Eppie Gibson next month.   2. Please take all medications as prescribed.    3. If you have worsening of your symptoms or new symptoms arise, please call the clinic ([phone]), or go to the ER immediately if symptoms are severe.

## 2015-07-21 NOTE — Progress Notes (Signed)
   Subjective:    Patient ID: Zachary [name], male    DOB: 1929/09/02, 82 y.o.   MRN: [id number]  HPI Comments: Zachary [name] is an 82 year old male with PMH as below here for follow-up of acute bronchitis.  Cough is improving.  He is concerned that his ALF stopped his Senokot.  He says they told him I had stopped it at our last visit.  He would also like me to remove the eye drops from his med list because he no longer uses them.  Otherwise, he reports decreased appetite since I last saw him but no N/V or diarrhea.  He does report abdominal pain.     Review of Systems  Constitutional: Negative for fever and chills.  Respiratory: Negative for shortness of breath.        Cough better than when I saw him last.  Gastrointestinal: Positive for abdominal pain. Negative for nausea, vomiting, diarrhea, constipation and blood in stool.       His facility stopped Senokot and told him we had d/c.  He has been using Miralax which is causing 2 BMs per day.  Genitourinary: Negative for dysuria, hematuria and difficulty urinating.       Filed Vitals:   07/21/15 1414  BP: 124/59  Pulse: 81  Temp: 98.4 F (36.9 C)  TempSrc: Oral  Height: '5\' 7"'$  (1.702 m)  Weight: 194 lb 12.8 oz (88.361 kg)  SpO2: 100%    Objective:   Physical Exam  Constitutional: He is oriented to person, place, and time. He appears well-developed. No distress.  HENT:  Head: Normocephalic and atraumatic.  Mouth/Throat: Oropharynx is clear and moist. No oropharyngeal exudate.  Eyes: Conjunctivae and EOM are normal. Pupils are equal, round, and reactive to light. Right eye exhibits no discharge. Left eye exhibits no discharge. No scleral icterus.  Neck: Neck supple.  Cardiovascular: Normal rate and regular rhythm.  Exam reveals no gallop and no friction rub.   No murmur heard. Pulmonary/Chest: Effort normal and breath sounds normal. No respiratory distress. He has no wheezes. He has no rales.  Abdominal: Soft. Bowel sounds are  normal. He exhibits no distension and no mass. There is tenderness. There is no rebound and no guarding.  + diastasis recti Mild diffuse TTP; no peritoneal signs  Musculoskeletal: Normal range of motion. He exhibits no edema or tenderness.  Neurological: He is alert and oriented to person, place, and time. No cranial nerve deficit.  Skin: Skin is warm. He is not diaphoretic.  Psychiatric: He has a normal mood and affect. His behavior is normal.  Vitals reviewed.         Assessment & Plan:  Please see problem based charting for A&P.

## 2015-07-22 LAB — CMP14 + ANION GAP
ALBUMIN: 4.1 g/dL (ref 3.5–4.7)
ALK PHOS: 130 IU/L — AB (ref 39–117)
ALT: 18 IU/L (ref 0–44)
ANION GAP: 22 mmol/L — AB (ref 10.0–18.0)
AST: 26 IU/L (ref 0–40)
Albumin/Globulin Ratio: 1.5 (ref 1.1–2.5)
BILIRUBIN TOTAL: 0.2 mg/dL (ref 0.0–1.2)
BUN / CREAT RATIO: 16 (ref 10–22)
BUN: 24 mg/dL (ref 8–27)
CHLORIDE: 94 mmol/L — AB (ref 96–106)
CO2: 19 mmol/L (ref 18–29)
CREATININE: 1.5 mg/dL — AB (ref 0.76–1.27)
Calcium: 8.9 mg/dL (ref 8.6–10.2)
GFR calc Af Amer: 49 mL/min/{1.73_m2} — ABNORMAL LOW (ref >59)
GFR calc non Af Amer: 42 mL/min/{1.73_m2} — ABNORMAL LOW (ref >59)
GLUCOSE: 88 mg/dL (ref 65–99)
Globulin, Total: 2.7 g/dL (ref 1.5–4.5)
Potassium: 4.8 mmol/L (ref 3.5–5.2)
Sodium: 135 mmol/L (ref 134–144)
Total Protein: 6.8 g/dL (ref 6.0–8.5)

## 2015-07-26 NOTE — Assessment & Plan Note (Addendum)
Assessment:  Mr. Rubey reports abdominal pain and decreased appetite since I saw him last.  For some reason, his ALF thought his Senokot was d/c so he has not been getting this since he last saw me.  Perhaps this has gotten him off schedule and is causing some discomfort (though he tells me he is having bowel movement, just not the consistency/amount of his normal BMs).  He is diffusely tender to palpation but has good bowel sounds, soft belly, no rebound or guarding, he is able to get on and off exam table without difficulty so I am not concerned for obstruction.  His weight is stable since last visit.  There was mention of ventral hernia in prior notes, and I myself thought he appeared to have one last visit, but upon further exam and talking with PCP, this seems more c/w rectus abdominis diastasis (there is ventral midline bulge), which I don't think would cause this pain and is usually managed conservatively. In reviewing old notes and speaking with his PCP I found that he has hx of chronic pain in multiple area including abdominal without clear etiology but possibly related to underlying depression, for which he is being treated.  He also seems to be unhappy with his living situation at ALF and spent a good part of the visit sharing with me that he does not get along with his roommate. Plan:  CMP.  Continue bowel regimen (I wrote a note to the facility to resume his Senokot) to help with bowels since he is on methadone and tramadol.  Continue depression med.  I have advised him to speak with facility about roommate change (it sounds like this has been an issue for awhile though).  Follow-up with PCP next month.

## 2015-07-26 NOTE — Assessment & Plan Note (Signed)
Assessment:  Cough better and he feels this problem is resolving.  VSS stable today. Plan:  Continue to monitor for new symptoms. He will follow-up with PCP next month.

## 2015-07-26 NOTE — Progress Notes (Signed)
Case discussed with Dr. Wilson at the time of the visit.  We reviewed the resident's history and exam and pertinent patient test results.  I agree with the assessment, diagnosis, and plan of care documented in the resident's note. 

## 2015-08-05 ENCOUNTER — Telehealth: Payer: Self-pay | Admitting: Internal Medicine

## 2015-08-08 ENCOUNTER — Other Ambulatory Visit: Payer: Self-pay | Admitting: Internal Medicine

## 2015-08-08 DIAGNOSIS — K5903 Drug induced constipation: Secondary | ICD-10-CM

## 2015-08-13 ENCOUNTER — Other Ambulatory Visit: Payer: Self-pay | Admitting: *Deleted

## 2015-08-13 DIAGNOSIS — G894 Chronic pain syndrome: Secondary | ICD-10-CM

## 2015-08-13 NOTE — Telephone Encounter (Signed)
Received faxed refill request from [facility] for pt's methadone-request sent to pcp for review, please advise.Despina Hidden Cassady2/23/20173:39 PM

## 2015-08-14 MED ORDER — METHADONE HCL 5 MG PO TABS: 5.0000 mg | ORAL_TABLET | Freq: Three times a day (TID) | ORAL | Status: DC

## 2015-08-14 NOTE — Telephone Encounter (Signed)
Prescriptions (x3) faxed and mailed to [facility], phone call complete.Regenia Skeeter, Darlene Cassady2/24/201710:28 AM

## 2015-08-19 ENCOUNTER — Telehealth: Payer: Self-pay

## 2015-08-19 ENCOUNTER — Ambulatory Visit (INDEPENDENT_AMBULATORY_CARE_PROVIDER_SITE_OTHER): Payer: Medicare Other | Admitting: Internal Medicine

## 2015-08-19 ENCOUNTER — Encounter: Payer: Self-pay | Admitting: Internal Medicine

## 2015-08-19 VITALS — BP 118/61 | HR 75 | Temp 98.4°F | Resp 18 | Ht 66.0 in | Wt 194.0 lb

## 2015-08-19 DIAGNOSIS — Z8709 Personal history of other diseases of the respiratory system: Secondary | ICD-10-CM | POA: Diagnosis not present

## 2015-08-19 DIAGNOSIS — J209 Acute bronchitis, unspecified: Secondary | ICD-10-CM

## 2015-08-19 DIAGNOSIS — R1030 Lower abdominal pain, unspecified: Secondary | ICD-10-CM

## 2015-08-19 DIAGNOSIS — R109 Unspecified abdominal pain: Secondary | ICD-10-CM

## 2015-08-19 DIAGNOSIS — R05 Cough: Secondary | ICD-10-CM

## 2015-08-19 MED ORDER — SIMETHICONE 125 MG PO CAPS: 125.0000 mg | ORAL_CAPSULE | Freq: Four times a day (QID) | ORAL | Status: DC | PRN

## 2015-08-19 NOTE — Assessment & Plan Note (Signed)
Has resolution of bronchitis. Has very mild residual cough but overall doing well. Normal lung exam.

## 2015-08-19 NOTE — Patient Instructions (Signed)
Your abd pain may be due to excessive gas/bloating. We will try Gas X 4 times a day as needed for this.  Continue your other meds as you are currently.  Keep your appt with Dr. Eppie Gibson.  Gave handout for intestinal gas.

## 2015-08-19 NOTE — Assessment & Plan Note (Signed)
Abdominal pain is likely 2/2 to intestinal gas/bloating. Last visit it was thought to be 2/2 to constipation which improved with daily sennokot. Has no acute abdominal signs on exam. No n/v/fever. Good bowel sound.   Will do trial of Gas X four times a day PRN. Gave handout about foods that can cause gas.

## 2015-08-19 NOTE — Telephone Encounter (Signed)
Zachary [name], could you mail Zachary [name] a list of assisted living facilities and the number to the ombudsmen per our conversation.  Thank you!

## 2015-08-19 NOTE — Progress Notes (Signed)
   Subjective:    Patient ID: Zachary [name], male    DOB: [birth year], 82 y.o.   MRN: [id number]  HPI  82 yo male with hx of HTN, chronic constipation, GERD, BPH, CKD 3, depression, obesity, presents for follow up of bronchitis and also abdominal pain.  Ab pain: last visit he had ab pain which was thought to be 2/2 to constipation. Was resumed on his sennokot, now has 2 BM daily, formed, non bloody. But still has generalized lower abd intermittent ab pain, not sure what causes it to change. No n/v, fever/ chills,dysuria.   Bronchitis: cough is better, still has mild cough but feeling much better.   No other complaints currently.    Review of Systems  Constitutional: Negative for fever, chills and fatigue.  HENT: Negative for congestion and sore throat.   Eyes: Negative for photophobia and visual disturbance.  Respiratory: Negative for cough, chest tightness and wheezing.   Gastrointestinal: Positive for abdominal pain. Negative for nausea, vomiting, diarrhea, constipation, blood in stool, abdominal distention and rectal pain.  Genitourinary: Negative for dysuria and hematuria.  Musculoskeletal: Negative for back pain and arthralgias.  Allergic/Immunologic: Negative.   Neurological: Negative for dizziness.       Has chronic headache Has some short term memory problem  Hematological: Negative.   Psychiatric/Behavioral: Negative.        Objective:   Physical Exam  Constitutional:  Elderly male. Pleasant. Some hardness of hearing.   HENT:  Head: Normocephalic and atraumatic.  Mouth/Throat: No oropharyngeal exudate.  Eyes: Conjunctivae are normal. Pupils are equal, round, and reactive to light.  Neck: Normal range of motion. No JVD present.  Cardiovascular: Normal rate and regular rhythm.  Exam reveals no gallop and no friction rub.   No murmur heard. Pulmonary/Chest: Effort normal and breath sounds normal. No respiratory distress. He exhibits no tenderness.  Abdominal:    Abdomen is soft, non distended. Has mild tenderness to palpation diffusely on lower abdomen. Has good bowel sounds. No rebound.   Musculoskeletal: Normal range of motion. He exhibits no edema or tenderness.  Skin:  Dry skin with some actinic keratosis changes.   Psychiatric: He has a normal mood and affect. His behavior is normal.    Filed Vitals:   08/19/15 1327  BP: 118/61  Pulse: 75  Temp: 98.4 F (36.9 C)  Resp: 18        Assessment & Plan:  See problem based a&p

## 2015-08-20 ENCOUNTER — Encounter: Payer: Self-pay | Admitting: Licensed Clinical Social Worker

## 2015-08-20 NOTE — Telephone Encounter (Signed)
Thank you.  Listing mailed to Mr. Marland.

## 2015-08-21 NOTE — Progress Notes (Signed)
Internal Medicine Clinic Attending  Case discussed with Dr. Ahmed at the time of the visit.  We reviewed the resident's history and exam and pertinent patient test results.  I agree with the assessment, diagnosis, and plan of care documented in the resident's note. 

## 2015-09-14 ENCOUNTER — Telehealth: Payer: Self-pay | Admitting: Internal Medicine

## 2015-09-14 ENCOUNTER — Other Ambulatory Visit: Payer: Self-pay | Admitting: Internal Medicine

## 2015-09-14 NOTE — Telephone Encounter (Signed)
CALL SHANA BACK-[phone]

## 2015-09-14 NOTE — Telephone Encounter (Signed)
Spoke with Zachary [name] giving conflicting information on methadone rx.  Reiterated that we gave 3 rx on 2/24 of #90, this should supply patient through 3 months

## 2015-09-14 NOTE — Telephone Encounter (Signed)
Shawna from [facility] requesting methadone and ativan to be filled.

## 2015-09-14 NOTE — Telephone Encounter (Signed)
Spoke with Santa Genera, advised 3 methadone rx mailed and faxed to [facility] on 2/24- also, should have refill remaining on the ativan.  Shawna to follow-up with [facility].

## 2015-09-21 NOTE — Telephone Encounter (Signed)
Called [facility] [place], they have the scripts thru 5/1, will request new scripts 4/27 for 6/1, 7/1, 8/1 in order to have them faxed and mailed to [facility] care on time

## 2015-09-23 ENCOUNTER — Other Ambulatory Visit: Payer: Self-pay | Admitting: Internal Medicine

## 2015-09-23 DIAGNOSIS — F419 Anxiety disorder, unspecified: Secondary | ICD-10-CM

## 2015-09-23 MED ORDER — LORAZEPAM 0.5 MG PO TABS: 0.5000 mg | ORAL_TABLET | Freq: Two times a day (BID) | ORAL | Status: DC | PRN

## 2015-09-23 MED ORDER — LORAZEPAM 0.5 MG PO TABS: 0.5000 mg | ORAL_TABLET | Freq: Two times a day (BID) | ORAL | Status: DC

## 2015-09-30 ENCOUNTER — Encounter: Payer: Self-pay | Admitting: *Deleted

## 2015-10-06 ENCOUNTER — Telehealth: Payer: Self-pay | Admitting: *Deleted

## 2015-10-06 NOTE — Telephone Encounter (Signed)
Order written earlier this morning on facility order sheet to follow clinically and to make an appointment in [facility] under certain circumstances that were detailed in the order faxed to the facility.

## 2015-10-06 NOTE — Telephone Encounter (Signed)
rec'd fax stating injury to L index finger, "pin prick" L index finger, please advise Fax given to dr Eppie Gibson

## 2015-10-19 ENCOUNTER — Other Ambulatory Visit: Payer: Self-pay

## 2015-10-19 DIAGNOSIS — G894 Chronic pain syndrome: Secondary | ICD-10-CM

## 2015-10-19 MED ORDER — METHADONE HCL 5 MG PO TABS: 5.0000 mg | ORAL_TABLET | Freq: Three times a day (TID) | ORAL | Status: DC

## 2015-10-19 NOTE — Telephone Encounter (Signed)
Three prescriptions printed with updated refill date information based upon 11 pills remaining at this time.

## 2015-10-19 NOTE — Telephone Encounter (Signed)
Last scripts written 08/14/15- facility states pt has about 11 tabs left Last OV 08/19/15 Upcoming appointment 11/26/15

## 2015-11-17 ENCOUNTER — Telehealth: Payer: Self-pay | Admitting: Internal Medicine

## 2015-11-17 NOTE — Telephone Encounter (Signed)
Needs refill for patient for  methadone (DOLOPHINE) 5 MG tablet

## 2015-11-17 NOTE — Telephone Encounter (Signed)
i have called [facility] and they have last 3 scripts written for pt- fill: 6/2, 7/2, 8/1 Have tried to call facility twice, their vmail is not working properly and no one answers the ph, will continue to try

## 2015-11-19 ENCOUNTER — Telehealth: Payer: Self-pay | Admitting: *Deleted

## 2015-11-19 DIAGNOSIS — G894 Chronic pain syndrome: Secondary | ICD-10-CM

## 2015-11-19 MED ORDER — METHADONE HCL 5 MG PO TABS: 5.0000 mg | ORAL_TABLET | Freq: Three times a day (TID) | ORAL | Status: DC

## 2015-11-19 NOTE — Telephone Encounter (Signed)
This was my error.  I miscalculated the dates and shorted him 2 days worth.  I have written a prescription for a 2 day supply which will correct for this.  His July and August prescriptions are correct as written.  Finally, I wrote for the subsequent methadone prescription due on 02/18/2016 to make sure he keeps on the appropriate 30 day schedule.  Thanks for notifying me of the shortfall I had prescribed.

## 2015-11-19 NOTE — Telephone Encounter (Signed)
shawna at facility calls and states pt does not have methadone to take today, last script was filled 5/1 and when they gave a count of 11 pills it was not actually 11 "it was about 11" but with there being 31 days in may and only 90 pills maybe this threw it off, none the less the pt is asking for his med and next script states do not fill till 6/2. Santa Genera is ask that when a request for #remaining is rec'd that dr Eppie Gibson needs the exact # not an estimate, she is agreeable and will speak w/ the med techs about this Please advise

## 2015-11-19 NOTE — Telephone Encounter (Signed)
2 new scripts faxed and mailed to [facility] [place], called and informed pharm also tried to call facility and their ph is once again not working correctly, pharmacist assured me that pt would be given 1 to 2 doses today as they will process as soon as fax arrives, copies put in folder

## 2015-11-26 ENCOUNTER — Ambulatory Visit: Payer: Medicare Other | Admitting: Internal Medicine

## 2015-11-27 ENCOUNTER — Ambulatory Visit: Payer: Medicare Other | Admitting: Internal Medicine

## 2015-12-15 ENCOUNTER — Telehealth: Payer: Self-pay

## 2015-12-15 NOTE — Telephone Encounter (Signed)
Requesting methadone to be filled.

## 2015-12-16 ENCOUNTER — Telehealth: Payer: Self-pay | Admitting: *Deleted

## 2015-12-16 NOTE — Telephone Encounter (Signed)
Closed, see other enc for this date

## 2015-12-16 NOTE — Telephone Encounter (Signed)
Called [facility] in [place], they have the script, dont know why [employer] keeps popping up

## 2015-12-18 ENCOUNTER — Other Ambulatory Visit: Payer: Self-pay | Admitting: *Deleted

## 2015-12-18 NOTE — Telephone Encounter (Signed)
Review meds 

## 2015-12-23 ENCOUNTER — Ambulatory Visit: Payer: Medicare Other

## 2015-12-31 ENCOUNTER — Encounter: Payer: Self-pay | Admitting: Internal Medicine

## 2015-12-31 ENCOUNTER — Ambulatory Visit (HOSPITAL_COMMUNITY)
Admission: RE | Admit: 2015-12-31 | Discharge: 2015-12-31 | Disposition: A | Discharge status: Home / Self Care | Payer: Medicare Other | Source: Ambulatory Visit | Attending: Internal Medicine | Admitting: Internal Medicine

## 2015-12-31 ENCOUNTER — Ambulatory Visit (INDEPENDENT_AMBULATORY_CARE_PROVIDER_SITE_OTHER): Payer: Medicare Other | Admitting: Internal Medicine

## 2015-12-31 VITALS — BP 147/67 | HR 73 | Temp 98.4°F | Wt 195.3 lb

## 2015-12-31 DIAGNOSIS — C4492 Squamous cell carcinoma of skin, unspecified: Secondary | ICD-10-CM | POA: Diagnosis not present

## 2015-12-31 DIAGNOSIS — M25562 Pain in left knee: Secondary | ICD-10-CM | POA: Insufficient documentation

## 2015-12-31 DIAGNOSIS — Z8781 Personal history of (healed) traumatic fracture: Secondary | ICD-10-CM | POA: Insufficient documentation

## 2015-12-31 NOTE — Assessment & Plan Note (Signed)
He presents with left-sided knee pain that started in April.He says the pain is worse over his left knee and radiates downwards towards his toes on the left side. He describes the pain as a sharp pain that feels like needles. He denies any numbness or weakness in this area. Denies any mechanical injury or trauma occurring to this knee within the last 3 months. He rates the pain an 8 out of 9 on a scale of 10 in severity. Denies any fevers chills or night sweats. He also reports a severe injury that happened approximately 30 years ago requiring multiple surgeries to the left knee and ankle. This pain most likely represents osteoarthritis of the left knee. He already takes several pain medications and I do not think it is appropriate to add an additional pain medication at this time. -- DG 4 view x-ray of the left knee -- F/u with Dr. Eppie Gibson in 1 month

## 2015-12-31 NOTE — Patient Instructions (Signed)
It was a pleasure seeing you today. Thank you for choosing [name] for your healthcare needs.   -- Follow-up with Dr. Eppie Gibson -- Will get x-ray today

## 2015-12-31 NOTE — Assessment & Plan Note (Signed)
He will need to see dermatologist in the near future for this condition. He states it has been approximately year and half since he has seen the dermatologist. -- Discuss with Dr. Eppie Gibson at next PCP appointment about scheduling a visit to the dermatologist

## 2015-12-31 NOTE — Progress Notes (Signed)
CC:   HPI: [name] is a 82 y.o. male with a h/o of hypertension, osteoarthritis of the left knee,anxiety, major depressive disorder, chronic pain disorder,BPH,  CKD stage III and arthritis of the hip who presents with left leg and foot pain since April.He says the pain feels like a burning pain is located over the left knee and radiates downwards to the left ankle and left toes. He does not remember any mechanical injury or trauma associated with this over the last 3 months.He denies any headaches, chest pain, shortness of breath. He does endorse abdominal pain over the last several years which has not increased in severity.  Please see problem-based charting for status of medical issues pertinent to this visit.     Past Medical History  Diagnosis Date  . Chronic pain disorder 06/22/2006  . Major depression ([place]) 06/22/2006  . Anxiety 06/22/2006  . Essential hypertension 06/22/2006  . Gastroesophageal reflux disease with stricture 06/22/2006    With esophagitis, requiring dilatation 08/02/2006   . Diverticulosis of colon 06/22/2006  . Benign prostatic hypertrophy with urinary obstruction 06/22/2006  . Arthritis of hip 05/21/2010    s/p left hip total arthroplasty   . Constipation due to pain medication 06/03/2011  . Gastritis 07/06/2012    Seen on EGD 08/02/2006   . Obesity (BMI 30.0-34.9) 07/06/2012  . Facial basal cell cancer 06/22/2006  . Squamous cell skin cancer 07/06/2012    In situ   . Irritable bowel syndrome   . Alcoholism ([place])     Remote  . Cataract   . Osteoarthritis of left knee 03/07/2013  . Swelling of left lower extremity 12/07/2012    Responds to lasix 40 mg daily     . Chronic kidney disease, stage 3 11/08/2013  . Aortic atherosclerosis ([place]) 02/26/2015    Seen on CT scan, currently asymptomatic   Current Outpatient Rx  Name  Route  Sig  Dispense  Refill  . amLODipine (NORVASC) 10 MG tablet   Oral   Take 1 tablet (10 mg total) by mouth daily.   30 tablet   11   .  artificial tears (LACRILUBE) OINT ophthalmic ointment      Use as directed in left eye at bedtime   3.5 g   6   . EXPIRED: aspirin 81 MG EC tablet   Oral   Take 1 tablet (81 mg total) by mouth daily. Swallow whole.   90 tablet   3   . feeding supplement (ENSURE IMMUNE HEALTH) LIQD   Oral   Take 237 mLs by mouth 3 (three) times daily with meals.   5688 mL   11   . EXPIRED: finasteride (PROSCAR) 5 MG tablet   Oral   Take 1 tablet (5 mg total) by mouth daily.   90 tablet   3   . fluticasone (FLONASE) 50 MCG/ACT nasal spray   Each Nare   Place 2 sprays into both nostrils daily.   16 g   2   . guaiFENesin (MUCINEX) 600 MG 12 hr tablet   Oral   Take 1 tablet (600 mg total) by mouth 2 (two) times daily as needed for cough or to loosen phlegm.   60 tablet   0   . ibuprofen (ADVIL,MOTRIN) 800 MG tablet   Oral   Take 1 tablet (800 mg total) by mouth every 8 (eight) hours as needed.   30 tablet   11   . LORazepam (ATIVAN) 0.5 MG  tablet   Oral   Take 1 tablet (0.5 mg total) by mouth every 12 (twelve) hours. In applesauce, dosing to be supervised by caregiver   60 tablet   5   . LORazepam (ATIVAN) 0.5 MG tablet   Oral   Take 1 tablet (0.5 mg total) by mouth every 12 (twelve) hours as needed for anxiety (if scheduled doses ineffective).   60 tablet   5   . methadone (DOLOPHINE) 5 MG tablet   Oral   Take 1 tablet (5 mg total) by mouth every 8 (eight) hours.   90 tablet   0     Please do not refill prior to 02/18/2016   . Multiple Vitamin (TAB-A-VITE) TABS   Oral   Take 1 tablet by mouth daily.   30 tablet   11   . omeprazole (PRILOSEC) 20 MG capsule   Oral   Take 1 capsule (20 mg total) by mouth daily.   30 capsule   6   . polyethylene glycol powder (GLYCOLAX/MIRALAX) powder   Oral   Take 17 g by mouth daily as needed for moderate constipation (Mix in 8 oz of water).   527 g   11   . protein supplement (PROMOD) POWD      Take 24gm by mouth 3(three)  times daily with meals.   454 g   11   . senna-docusate (SENOKOT-S) 8.6-50 MG per tablet   Oral   Take 4 tablets by mouth at bedtime.   120 tablet   11   . sertraline (ZOLOFT) 50 MG tablet   Oral   Take 3 tablets (150 mg total) by mouth daily.   90 tablet   3   . Simethicone 125 MG CAPS   Oral   Take 1 capsule (125 mg total) by mouth 4 (four) times daily as needed.   90 each   0   . Tamsulosin HCl (FLOMAX) 0.4 MG CAPS   Oral   Take 1 capsule (0.4 mg total) by mouth daily.   30 capsule   11   . traMADol (ULTRAM) 50 MG tablet   Oral   Take 1 tablet (50 mg total) by mouth every 6 (six) hours as needed for moderate pain.   120 tablet   5     Review of Systems: A complete ROS was negative except as per HPI.  Physical Exam: Filed Vitals:   12/31/15 1316  BP: 147/67  Pulse: 73  Temp: 98.4 F (36.9 C)  TempSrc: Oral  Weight: 195 lb 4.8 oz (88.587 kg)  SpO2: 99%   General appearance: alert and cooperative Head: Normocephalic, without obvious abnormality, atraumatic Lungs: clear to auscultation bilaterally Heart: regular rate and rhythm, S1, S2 normal, no murmur, click, rub or gallop Abdomen: Tense and tender to palpation in all quadrants, bowel sounds present, no abdominal bruits Extremities: Left knee swollen, no effusion, mild varacosities surronding the patella, pain upon palpation of the knee and lateral lower leg, no pain upon palpation of the calf, muscle strength and sensation normal and intact DERM: diffuse lesions and erythema on his upper extremities and face  Assessment & Plan:  See encounters tab for problem based medical decision making. Patient seen with Dr. Evette Doffing  Signed: Ophelia Shoulder, MD 12/31/2015, 1:19 PM  Pager: [phone]

## 2016-01-05 NOTE — Progress Notes (Signed)
Internal Medicine Clinic Attending  I saw and evaluated the patient.  I personally confirmed the key portions of the history and exam documented by Dr. Lovena Le and I reviewed pertinent patient test results.  The assessment, diagnosis, and plan were formulated together and I agree with the documentation in the resident's note.  Only mild crepitus on exam, xray with mild joint space narrowing. This pain seems more consistent with his chronic pain syndrome rather than progressive osteoarthritis.

## 2016-01-18 ENCOUNTER — Telehealth: Payer: Self-pay | Admitting: *Deleted

## 2016-01-18 ENCOUNTER — Telehealth: Payer: Self-pay | Admitting: Internal Medicine

## 2016-01-18 DIAGNOSIS — F419 Anxiety disorder, unspecified: Secondary | ICD-10-CM

## 2016-01-18 DIAGNOSIS — G894 Chronic pain syndrome: Secondary | ICD-10-CM

## 2016-01-18 NOTE — Telephone Encounter (Signed)
Pt would like a copy of his xray results, you may want to call him, he stated he wanted to get something done to his knee and needed the report to take with him, he may need a referral, you may call the # listed on his snapshot Sending to dr's klima, hoffman and taylor

## 2016-01-18 NOTE — Telephone Encounter (Signed)
methadone (DOLOPHINE) 5 MG tablet LORazepam (ATIVAN) 0.5 MG tablet

## 2016-01-18 NOTE — Telephone Encounter (Signed)
Please refer him to the records department as he will need to sign a release to get a copy of his X-ray report.  I have not seen Zachary [name] in a very long time secondary to cancelled appointments.  Before I place a consult I will need to assess him to better understand what his wants and needs are.  Thanks

## 2016-01-19 MED ORDER — METHADONE HCL 5 MG PO TABS: 5.0000 mg | ORAL_TABLET | Freq: Three times a day (TID) | ORAL | 0 refills | Status: DC

## 2016-01-19 MED ORDER — LORAZEPAM 0.5 MG PO TABS: 0.5000 mg | ORAL_TABLET | Freq: Two times a day (BID) | ORAL | 5 refills | Status: DC | PRN

## 2016-01-19 NOTE — Telephone Encounter (Signed)
Called in lorazepam and faxed methadone RXs

## 2016-01-19 NOTE — Telephone Encounter (Signed)
3 RX given of methadone 11/19/15  Ativan written 09/23/15 with 5 refills

## 2016-01-20 NOTE — Telephone Encounter (Signed)
Will try to call again this pm, no answer this am nor yesterday midday

## 2016-01-28 NOTE — Telephone Encounter (Signed)
No answer

## 2016-02-16 ENCOUNTER — Encounter: Payer: Self-pay | Admitting: Internal Medicine

## 2016-02-16 DIAGNOSIS — G894 Chronic pain syndrome: Secondary | ICD-10-CM

## 2016-02-16 NOTE — Telephone Encounter (Signed)
Needs methadone to be filled.

## 2016-02-16 NOTE — Telephone Encounter (Signed)
Call made to [facility] follow-up on pt's rx request.  Pt has 2 rxs in charted that appears to have been faxed on 01/19/2016.  Verdis Frederickson (pharmacist) asked that they be faxed to her attention @ [phone] and she will call me back if additional information needed.Despina Hidden Cassady8/29/20174:07 PM

## 2016-02-19 ENCOUNTER — Encounter: Payer: Medicare Other | Admitting: Internal Medicine

## 2016-03-08 ENCOUNTER — Other Ambulatory Visit: Payer: Self-pay | Admitting: Internal Medicine

## 2016-03-09 ENCOUNTER — Telehealth: Payer: Self-pay | Admitting: Internal Medicine

## 2016-03-09 NOTE — Telephone Encounter (Signed)
APT. REMINDER CALL, NO VOICEMAIL

## 2016-03-10 ENCOUNTER — Encounter: Payer: Medicare Other | Admitting: Internal Medicine

## 2016-03-16 ENCOUNTER — Other Ambulatory Visit: Payer: Self-pay | Admitting: *Deleted

## 2016-03-16 ENCOUNTER — Telehealth: Payer: Self-pay | Admitting: Internal Medicine

## 2016-03-16 NOTE — Telephone Encounter (Signed)
methadone (DOLOPHINE) 5 MG tablet refill

## 2016-03-17 ENCOUNTER — Other Ambulatory Visit: Payer: Self-pay | Admitting: *Deleted

## 2016-03-17 NOTE — Telephone Encounter (Signed)
Opened in error

## 2016-03-17 NOTE — Telephone Encounter (Signed)
The pharmacy has scripts to last thru October, they will notify facility

## 2016-04-12 ENCOUNTER — Other Ambulatory Visit: Payer: Self-pay | Admitting: Internal Medicine

## 2016-04-12 DIAGNOSIS — J209 Acute bronchitis, unspecified: Secondary | ICD-10-CM

## 2016-04-20 ENCOUNTER — Other Ambulatory Visit: Payer: Self-pay | Admitting: Internal Medicine

## 2016-04-20 DIAGNOSIS — G894 Chronic pain syndrome: Secondary | ICD-10-CM

## 2016-04-20 MED ORDER — METHADONE HCL 5 MG PO TABS: 5.0000 mg | ORAL_TABLET | Freq: Three times a day (TID) | ORAL | 0 refills | Status: DC

## 2016-04-20 NOTE — Telephone Encounter (Signed)
Needs refill of Methadone [facility] pharmacy

## 2016-04-20 NOTE — Telephone Encounter (Signed)
Faxed script to [facility], called and verified receipt and put in mail

## 2016-05-19 ENCOUNTER — Encounter: Payer: Self-pay | Admitting: Internal Medicine

## 2016-05-19 ENCOUNTER — Telehealth: Payer: Self-pay

## 2016-05-19 ENCOUNTER — Ambulatory Visit (INDEPENDENT_AMBULATORY_CARE_PROVIDER_SITE_OTHER): Payer: Medicare Other | Admitting: Internal Medicine

## 2016-05-19 ENCOUNTER — Ambulatory Visit (HOSPITAL_COMMUNITY)
Admission: RE | Admit: 2016-05-19 | Discharge: 2016-05-19 | Disposition: A | Discharge status: Home / Self Care | Payer: Medicare Other | Source: Ambulatory Visit | Attending: Internal Medicine | Admitting: Internal Medicine

## 2016-05-19 VITALS — BP 142/60 | HR 77 | Temp 97.9°F | Ht 67.0 in

## 2016-05-19 DIAGNOSIS — Z87891 Personal history of nicotine dependence: Secondary | ICD-10-CM

## 2016-05-19 DIAGNOSIS — G894 Chronic pain syndrome: Secondary | ICD-10-CM

## 2016-05-19 DIAGNOSIS — M7989 Other specified soft tissue disorders: Secondary | ICD-10-CM

## 2016-05-19 MED ORDER — METHADONE HCL 5 MG PO TABS: 5.0000 mg | ORAL_TABLET | Freq: Three times a day (TID) | ORAL | 0 refills | Status: DC

## 2016-05-19 NOTE — Telephone Encounter (Signed)
Zachary [name] from [facility] requesting methadone to be filled.

## 2016-05-19 NOTE — Progress Notes (Signed)
   CC: leg swelling   HPI: [name] is a 82 y.o. with past medical history as outlined below who presents to clinic for follow up of swelling and pain in his legs. States that swelling of his legs started about a week ago. Over the past month he has been less active, he has been sitting in a chair for the majority of the day and has been walking much less. Ibuprofen has helped to alleviate the leg pain. The swelling is associated with pain in his legs but he denies orthopnea, shortness of breath, paroxysmal nocturnal history or chest pain. He denies chills or night sweats. He has not tried compression stockings or elevating his feet to alleviate the swelling.   Please see problem list for status of the pt's chronic medical problems.  Past Medical History:  Diagnosis Date  . Alcoholism ([place])    Remote  . Anxiety 06/22/2006  . Aortic atherosclerosis ([place]) 02/26/2015   Seen on CT scan, currently asymptomatic  . Arthritis of hip 05/21/2010   s/p left hip total arthroplasty   . Benign prostatic hypertrophy with urinary obstruction 06/22/2006  . Cataract   . Chronic kidney disease, stage 3 11/08/2013  . Chronic pain disorder 06/22/2006  . Constipation due to pain medication 06/03/2011  . Diverticulosis of colon 06/22/2006  . Essential hypertension 06/22/2006  . Facial basal cell cancer 06/22/2006  . Gastritis 07/06/2012   Seen on EGD 08/02/2006   . Gastroesophageal reflux disease with stricture 06/22/2006   With esophagitis, requiring dilatation 08/02/2006   . Irritable bowel syndrome   . Major depression 06/22/2006  . Obesity (BMI 30.0-34.9) 07/06/2012  . Osteoarthritis of left knee 03/07/2013  . Squamous cell skin cancer 07/06/2012   In situ   . Swelling of left lower extremity 12/07/2012   Responds to lasix 40 mg daily       Review of Systems:  Please see each problem below for a pertinent review of systems. ROS  Physical Exam:  Vitals:   05/19/16 1510  BP: (!) 142/60  Pulse: 77  Temp: 97.9  F (36.6 C)  TempSrc: Oral  SpO2: 99%  Height: '5\' 7"'$  (1.702 m)   Physical Exam  Constitutional: He appears well-developed and well-nourished. No distress.  Eyes: Conjunctivae are normal. No scleral icterus.  Cardiovascular: Normal rate, regular rhythm and intact distal pulses.   No murmur heard. 2+ bilateral lower extremity pitting edema, right leg swelling greater than left   Pulmonary/Chest: Effort normal. He has no wheezes. He has no rales.  Abdominal: Soft. He exhibits no distension. There is no tenderness.  Skin: He is not diaphoretic.  Actinic keratosis    Assessment & Plan:   See Encounters Tab for problem based charting.  Patient seen with Dr. Dareen Piano

## 2016-05-19 NOTE — Patient Instructions (Addendum)
It was a pleasure to meet you today [name]   For your swelling - we have contacted your living facility and asked them to fit you for compression stockings  Schedule a follow up appointment for 1 week   Venous Stasis or Chronic Venous Insufficiency Chronic venous insufficiency, also called venous stasis, is a condition that affects the veins in the legs. The condition prevents blood from being pumped through these veins effectively. Blood may no longer be pumped effectively from the legs back to the heart. This condition can range from mild to severe. With proper treatment, you should be able to continue with an active life. CAUSES  Chronic venous insufficiency occurs when the vein walls become stretched, weakened, or damaged or when valves within the vein are damaged. Some common causes of this include:  High blood pressure inside the veins (venous hypertension).  Increased blood pressure in the leg veins from long periods of sitting or standing.  A blood clot that blocks blood flow in a vein (deep vein thrombosis).  Inflammation of a superficial vein (phlebitis) that causes a blood clot to form. RISK FACTORS Various things can make you more likely to develop chronic venous insufficiency, including:  Family history of this condition.  Obesity.  Pregnancy.  Sedentary lifestyle.  Smoking.  Jobs requiring long periods of standing or sitting in one place.  Being a certain age. Women in their 14s and 42s and men in their 43s are more likely to develop this condition. SIGNS AND SYMPTOMS  Symptoms may include:   Varicose veins.  Skin breakdown or ulcers.  Reddened or discolored skin on the leg.  Brown, smooth, tight, and painful skin just above the ankle, usually on the inside surface (lipodermatosclerosis).  Swelling. DIAGNOSIS  To diagnose this condition, your health care provider will take a medical history and do a physical exam. The following tests may be ordered to  confirm the diagnosis:  Duplex ultrasound-A procedure that produces a picture of a blood vessel and nearby organs and also provides information on blood flow through the blood vessel.  Plethysmography-A procedure that tests blood flow.  A venogram, or venography-A procedure used to look at the veins using X-ray and dye. TREATMENT The goals of treatment are to help you return to an active life and to minimize pain or disability. Treatment will depend on the severity of the condition. Medical procedures may be needed for severe cases. Treatment options may include:   Use of compression stockings. These can help with symptoms and lower the chances of the problem getting worse, but they do not cure the problem.  Sclerotherapy-A procedure involving an injection of a material that "dissolves" the damaged veins. Other veins in the network of blood vessels take over the function of the damaged veins.  Surgery to remove the vein or cut off blood flow through the vein (vein stripping or laser ablation surgery).  Surgery to repair a valve. HOME CARE INSTRUCTIONS   Wear compression stockings as directed by your health care provider.  Only take over-the-counter or prescription medicines for pain, discomfort, or fever as directed by your health care provider.  Follow up with your health care provider as directed. SEEK MEDICAL CARE IF:   You have redness, swelling, or increasing pain in the affected area.  You see a red streak or line that extends up or down from the affected area.  You have a breakdown or loss of skin in the affected area, even if the breakdown is  small.  You have an injury to the affected area. SEEK IMMEDIATE MEDICAL CARE IF:   You have an injury and open wound in the affected area.  Your pain is severe and does not improve with medicine.  You have sudden numbness or weakness in the foot or ankle below the affected area, or you have trouble moving your foot or ankle.  You  have a fever or persistent symptoms for more than 2-3 days.  You have a fever and your symptoms suddenly get worse. MAKE SURE YOU:   Understand these instructions.  Will watch your condition.  Will get help right away if you are not doing well or get worse. This information is not intended to replace advice given to you by your health care provider. Make sure you discuss any questions you have with your health care provider. Document Released: 10/10/2006 Document Revised: 03/27/2013 Document Reviewed: 02/11/2013 Elsevier Interactive Patient Education  2017 [employer].

## 2016-05-19 NOTE — Telephone Encounter (Signed)
Last visit:05/19/2016 Next appointment: 07/01/2016 Last Rx 04/20/2016 90 tabs no refills  Has 2 pills left

## 2016-05-19 NOTE — Progress Notes (Addendum)
*  PRELIMINARY RESULTS* Vascular Ultrasound Bilateral lower extremity venous duplex has been completed.  Preliminary findings: No evidence of deep vein thrombosis in the visualized veins of the lower extremities. Limited visualization of the peroneal veins bilaterally due to massive edema. Negative for baker's cysts bilaterally.  Attempt made to call doctor with preliminary results at 18:10- no answer. Everrett Coombe 05/19/2016, 5:10 PM

## 2016-05-20 NOTE — Telephone Encounter (Signed)
Dr Eppie Gibson brought new script for end of dec to clinic, called pharm and faxed/ mailed

## 2016-05-23 NOTE — Assessment & Plan Note (Addendum)
States that swelling of his legs started about a week ago. Over the past month he has been less active, he has been sitting in a chair for the majority of the day and has been walking much less. Ibuprofen has helped to alleviate the leg pain. The swelling is associated with pain in his legs but he denies orthopnea, shortness of breath, paroxysmal nocturnal history or chest pain. He denies chills or night sweats. He has not tried compression stockings or elevating his feet to alleviate the swelling.   On exam he has 2+ bilateral pitting edema, his right leg swelling is larger than the left. Given his sedentary lifestyle there was concern that he may have developed DVT. He was sent for lower extremity dopplers which were negative. This leg swelling may be related to chronic venous insufficiency.He has been on lasix in the past for leg swelling but developed lightheadedness so this was discontinued. Will try a trial of compression stockings and elevation of his lower extremities for one week.   Ordered lower extremity dopplers  Trial of compression stockings and elevation for one week  Continue over the counter ibuprofen for pain  Follow up in 1 week, if his swelling has not resolved at that time can consider Echo or low dose lasix

## 2016-05-25 NOTE — Progress Notes (Addendum)
Internal Medicine Clinic Attending  I saw and evaluated the patient.  I personally confirmed the key portions of the history and exam documented by Dr. Hetty Ely and I reviewed pertinent patient test results.  The assessment, diagnosis, and plan were formulated together and I agree with the documentation in the resident's note.  LE dopplers were negative for DVT. We will attempt compression stockings to improve his lower extremity swelling.

## 2016-05-27 ENCOUNTER — Ambulatory Visit (INDEPENDENT_AMBULATORY_CARE_PROVIDER_SITE_OTHER): Payer: Medicare Other | Admitting: Pulmonary Disease

## 2016-05-27 ENCOUNTER — Encounter: Payer: Self-pay | Admitting: Pulmonary Disease

## 2016-05-27 VITALS — BP 166/60 | HR 83 | Temp 98.0°F | Wt 205.1 lb

## 2016-05-27 DIAGNOSIS — I10 Essential (primary) hypertension: Secondary | ICD-10-CM | POA: Diagnosis not present

## 2016-05-27 DIAGNOSIS — R600 Localized edema: Secondary | ICD-10-CM | POA: Diagnosis present

## 2016-05-27 DIAGNOSIS — Z87891 Personal history of nicotine dependence: Secondary | ICD-10-CM | POA: Diagnosis not present

## 2016-05-27 DIAGNOSIS — I872 Venous insufficiency (chronic) (peripheral): Secondary | ICD-10-CM

## 2016-05-27 DIAGNOSIS — M7989 Other specified soft tissue disorders: Secondary | ICD-10-CM

## 2016-05-27 DIAGNOSIS — G894 Chronic pain syndrome: Secondary | ICD-10-CM | POA: Diagnosis not present

## 2016-05-27 MED ORDER — FINASTERIDE 5 MG PO TABS: 5.0000 mg | ORAL_TABLET | Freq: Every day | ORAL | 3 refills | Status: AC

## 2016-05-27 MED ORDER — FUROSEMIDE 40 MG PO TABS: ORAL_TABLET | ORAL | 1 refills | Status: DC

## 2016-05-27 MED ORDER — LISINOPRIL 5 MG PO TABS: 5.0000 mg | ORAL_TABLET | Freq: Every day | ORAL | 1 refills | Status: DC

## 2016-05-27 MED ORDER — DOXAZOSIN MESYLATE 1 MG PO TABS: 1.0000 mg | ORAL_TABLET | Freq: Every day | ORAL | 1 refills | Status: DC

## 2016-05-27 MED ORDER — TRAMADOL HCL 50 MG PO TABS: 50.0000 mg | ORAL_TABLET | Freq: Two times a day (BID) | ORAL | 1 refills | Status: DC | PRN

## 2016-05-27 NOTE — Progress Notes (Signed)
   CC: BLE edema  HPI:  [name] is a 82 year old man with history as noted below presenting for follow up of BLE edema.  He started having leg swelling 2 weeks ago. He has not tried compression stockings. He has been elevating his legs. He feels his legs are unchanged. He has leg pain on both sides. He sleeps with two pillows. He notes slight dyspnea when he is flat. He feels his breathing is heavier than his baseline. He has dyspnea when exertion. This has worsened. He has PND. He thinks he gained 6-7 lbs. He urinates a lot at night. No dysuria or hematuria.    Past Medical History:  Diagnosis Date  . Alcoholism ([place])    Remote  . Anxiety 06/22/2006  . Aortic atherosclerosis ([place]) 02/26/2015   Seen on CT scan, currently asymptomatic  . Arthritis of hip 05/21/2010   s/p left hip total arthroplasty   . Benign prostatic hypertrophy with urinary obstruction 06/22/2006  . Cataract   . Chronic kidney disease, stage 3 11/08/2013  . Chronic pain disorder 06/22/2006  . Constipation due to pain medication 06/03/2011  . Diverticulosis of colon 06/22/2006  . Essential hypertension 06/22/2006  . Facial basal cell cancer 06/22/2006  . Gastritis 07/06/2012   Seen on EGD 08/02/2006   . Gastroesophageal reflux disease with stricture 06/22/2006   With esophagitis, requiring dilatation 08/02/2006   . Irritable bowel syndrome   . Major depression 06/22/2006  . Obesity (BMI 30.0-34.9) 07/06/2012  . Osteoarthritis of left knee 03/07/2013  . Squamous cell skin cancer 07/06/2012   In situ   . Swelling of left lower extremity 12/07/2012   Responds to lasix 40 mg daily       Review of Systems:   No fevers or chills No nausea/vomiting  Physical Exam:  Vitals:   05/27/16 0956  BP: (!) 166/60  Pulse: 83  Temp: 98 F (36.7 C)  TempSrc: Oral  SpO2: (!) 83%  Weight: 205 lb 1.6 oz (93 kg)   General Apperance: NAD HEENT: Normocephalic, atraumatic, anicteric sclera Neck: Supple, trachea midline Lungs: Few  crackles at bases. Breathing comfortably on room air. Heart: Regular rate and rhythm, +systolic ejection murmur Abdomen: Soft, nontender, nondistended, no rebound/guarding Extremities: Warm and well perfused, 2+ pitting edema to knees Skin: Mild erythema without purulent drainage BLE Neurologic: Alert and interactive. No gross deficits.   Assessment & Plan:   See Encounters Tab for problem based charting.  Patient discussed with Dr. Lynnae January

## 2016-05-27 NOTE — Patient Instructions (Signed)
1. Start furosemide 40 mg twice a day for 3 days. After this take furosemide 40 mg once a day. 2. Wear compression stockings 3. Stop taking amlodipine 4. Start taking lisinopril 5 mg daily  Follow-up in one week  Watch for lightheadedness or dizziness.

## 2016-05-27 NOTE — Assessment & Plan Note (Signed)
Assessment: BLE edema. Venous duplex negative for DVT at last visit. He does report some worsened dyspnea on exertion, mild orthopnea, and PND. Previously noted to have chronic venous insufficiency but may have a component of CHF (probable diastolic, possible systolic). Amlodipine may also be contributing to the LE edema.  Plan: Change amlodipine to lisinopril '5mg'$  daily Start Lasix '40mg'$  BID for 3 days then change to daily Follow up in 1 week for BMP recheck. May consider starting a beta blocker at that time. Will hold off on echo at this time as it would probably not change our management

## 2016-05-27 NOTE — Assessment & Plan Note (Signed)
Assessment: BP elevated today at 166/60. May be in part due to leg pain.  Plan: Discontinue amlodipine due to LE edema Start lisinopril '5mg'$  daily Follow up in 1 week with BMP

## 2016-05-27 NOTE — Assessment & Plan Note (Signed)
He reports some breakthrough pain despite methadone and ibuprofen at his facility. Per MAR from facility, he has not received any of his tramadol. Discussed with him that he can ask for his tramadol. Changed to '50mg'$  Q12 hr prn for now to reduce fall risk.

## 2016-05-30 NOTE — Progress Notes (Signed)
Internal Medicine Clinic Attending  Case discussed with Dr. Krall at the time of the visit.  We reviewed the resident's history and exam and pertinent patient test results.  I agree with the assessment, diagnosis, and plan of care documented in the resident's note.  

## 2016-06-03 ENCOUNTER — Ambulatory Visit: Payer: Medicare Other

## 2016-06-07 ENCOUNTER — Ambulatory Visit: Payer: Medicare Other

## 2016-06-24 ENCOUNTER — Other Ambulatory Visit: Payer: Self-pay | Admitting: *Deleted

## 2016-06-24 DIAGNOSIS — G894 Chronic pain syndrome: Secondary | ICD-10-CM

## 2016-06-24 MED ORDER — METHADONE HCL 5 MG PO TABS: 5.0000 mg | ORAL_TABLET | Freq: Three times a day (TID) | ORAL | 0 refills | Status: DC

## 2016-06-24 NOTE — Telephone Encounter (Addendum)
Refill request from [facility] pt completely out of [facility]-needs hard copy faxed and mailed asap.  Will send to MD for review.Zachary [name], [name]1/5/201810:50 AM

## 2016-07-01 ENCOUNTER — Ambulatory Visit: Payer: Medicare Other

## 2016-07-06 ENCOUNTER — Ambulatory Visit: Payer: Medicare Other

## 2016-07-18 ENCOUNTER — Ambulatory Visit (INDEPENDENT_AMBULATORY_CARE_PROVIDER_SITE_OTHER): Payer: Medicare Other | Admitting: Internal Medicine

## 2016-07-18 ENCOUNTER — Ambulatory Visit (HOSPITAL_COMMUNITY)
Admission: RE | Admit: 2016-07-18 | Discharge: 2016-07-18 | Disposition: A | Discharge status: Home / Self Care | Payer: Medicare Other | Source: Ambulatory Visit | Attending: Internal Medicine | Admitting: Internal Medicine

## 2016-07-18 VITALS — BP 148/66 | HR 73 | Temp 98.3°F | Ht 66.5 in | Wt 196.3 lb

## 2016-07-18 DIAGNOSIS — I10 Essential (primary) hypertension: Secondary | ICD-10-CM

## 2016-07-18 DIAGNOSIS — Z96642 Presence of left artificial hip joint: Secondary | ICD-10-CM | POA: Diagnosis not present

## 2016-07-18 DIAGNOSIS — M7989 Other specified soft tissue disorders: Secondary | ICD-10-CM

## 2016-07-18 DIAGNOSIS — Z79899 Other long term (current) drug therapy: Secondary | ICD-10-CM | POA: Diagnosis not present

## 2016-07-18 DIAGNOSIS — I700 Atherosclerosis of aorta: Secondary | ICD-10-CM | POA: Diagnosis not present

## 2016-07-18 DIAGNOSIS — R0789 Other chest pain: Secondary | ICD-10-CM | POA: Diagnosis not present

## 2016-07-18 DIAGNOSIS — L84 Corns and callosities: Secondary | ICD-10-CM | POA: Diagnosis not present

## 2016-07-18 DIAGNOSIS — D692 Other nonthrombocytopenic purpura: Secondary | ICD-10-CM | POA: Insufficient documentation

## 2016-07-18 DIAGNOSIS — K219 Gastro-esophageal reflux disease without esophagitis: Secondary | ICD-10-CM | POA: Diagnosis not present

## 2016-07-18 DIAGNOSIS — R600 Localized edema: Secondary | ICD-10-CM

## 2016-07-18 DIAGNOSIS — F329 Major depressive disorder, single episode, unspecified: Secondary | ICD-10-CM | POA: Insufficient documentation

## 2016-07-18 DIAGNOSIS — N183 Chronic kidney disease, stage 3 (moderate): Secondary | ICD-10-CM | POA: Insufficient documentation

## 2016-07-18 DIAGNOSIS — Z87891 Personal history of nicotine dependence: Secondary | ICD-10-CM | POA: Diagnosis not present

## 2016-07-18 DIAGNOSIS — F1020 Alcohol dependence, uncomplicated: Secondary | ICD-10-CM | POA: Diagnosis not present

## 2016-07-18 DIAGNOSIS — Z6834 Body mass index (BMI) 34.0-34.9, adult: Secondary | ICD-10-CM | POA: Insufficient documentation

## 2016-07-18 DIAGNOSIS — K589 Irritable bowel syndrome without diarrhea: Secondary | ICD-10-CM | POA: Insufficient documentation

## 2016-07-18 DIAGNOSIS — N401 Enlarged prostate with lower urinary tract symptoms: Secondary | ICD-10-CM | POA: Diagnosis not present

## 2016-07-18 DIAGNOSIS — F419 Anxiety disorder, unspecified: Secondary | ICD-10-CM | POA: Diagnosis not present

## 2016-07-18 DIAGNOSIS — I129 Hypertensive chronic kidney disease with stage 1 through stage 4 chronic kidney disease, or unspecified chronic kidney disease: Secondary | ICD-10-CM | POA: Insufficient documentation

## 2016-07-18 DIAGNOSIS — E669 Obesity, unspecified: Secondary | ICD-10-CM | POA: Insufficient documentation

## 2016-07-18 DIAGNOSIS — R0609 Other forms of dyspnea: Secondary | ICD-10-CM

## 2016-07-18 DIAGNOSIS — N138 Other obstructive and reflux uropathy: Secondary | ICD-10-CM | POA: Diagnosis not present

## 2016-07-18 DIAGNOSIS — L988 Other specified disorders of the skin and subcutaneous tissue: Secondary | ICD-10-CM | POA: Diagnosis present

## 2016-07-18 LAB — BRAIN NATRIURETIC PEPTIDE: B NATRIURETIC PEPTIDE 5: 129.4 pg/mL — AB (ref 0.0–100.0)

## 2016-07-18 NOTE — Assessment & Plan Note (Signed)
Swelling in his legs is improved. Last visit he was changed off amlodipine to lisinopirl and started on lasix 40 mg bid x 3 days and then daily thereafter. Has been using the compression stockings every other day. Does thinking his swelling is improved when wearing and still swell when he does not wear them. Weight is down from 205 lbs to 196 lbs. Does report dyspnea with exertion; reports can walk 75 feet before getting shortness. This is still getting worse since last visit. Has right sided chest chest pain that is relieved with pressure. Unable to characterize the pain. Does have associated shortness of breath. Pain does not radiate anywhere. Occurs when he lays down. No nausea, vomiting, diaphoresis. Gets dizzy when the pain occurs. Uses two pillows behind his back at night and has some dyspnea with this, unchanged. Denies any PND.   EKG today unremarkable with no ischemic changes.  Exam today with 1+ pitting edema bilaterally. Lungs CTAB and no JVD.  Assessment: LE edema  Plan: Continue Lasix 40 mg daily as patient appears to have minimal volume overload.  BNP today is mildly elevated at 129 Will geet ECHO to assess for HF and any regional wall abnormalities that may require further ischemic evaluation

## 2016-07-18 NOTE — Assessment & Plan Note (Signed)
BP Readings from Last 3 Encounters:  07/18/16 (!) 148/66  05/27/16 (!) 166/60  05/19/16 (!) 142/60    Lab Results  Component Value Date   NA 135 07/21/2015   K 4.8 07/21/2015   CREATININE 1.50 (H) 07/21/2015    BP today is 148/66. Currently on lisinopril 5 mg daily and lasix 40 mg dialy.   Assessment: improved HTN  Plan: Continue current medications BMET today

## 2016-07-18 NOTE — Patient Instructions (Signed)
Zachary [name],   Your EKG today looked good without any changes concerning for a heart attack. I would like to get you set up to take an ultrasound of your heart and make sure there are no issues that would require further work up. I suspect the swelling in your legs is from your heart not pumping as well as before.   The spots on your right arm are from your skin thinning and being more easy to bruise. This is not coming from blood clots. The best thing to do is to wear long sleeve clothing and avoid any trauma as best as possible.  The spot on your foot is a callous. There are no signs of infection today. I would recommend getting better fitting shoes to wear while walking and some additional padding on that spot. If it continues to bother you we can refer to you podiatry.   Please come back to clinic in 2 weeks for follow up.

## 2016-07-18 NOTE — Progress Notes (Signed)
   CC: blood clots in hand  HPI:  Zachary [name] is a 83 y.o. male with a past medical history listed below here today with complaints of blood clots in hands.  For details of today's visit and the status of his chronic medical issues please refer to the assessment and plan.  Past Medical History:  Diagnosis Date  . Alcoholism ([place])    Remote  . Anxiety 06/22/2006  . Aortic atherosclerosis ([place]) 02/26/2015   Seen on CT scan, currently asymptomatic  . Arthritis of hip 05/21/2010   s/p left hip total arthroplasty   . Benign prostatic hypertrophy with urinary obstruction 06/22/2006  . Cataract   . Chronic kidney disease, stage 3 11/08/2013  . Chronic pain disorder 06/22/2006  . Constipation due to pain medication 06/03/2011  . Diverticulosis of colon 06/22/2006  . Essential hypertension 06/22/2006  . Facial basal cell cancer 06/22/2006  . Gastritis 07/06/2012   Seen on EGD 08/02/2006   . Gastroesophageal reflux disease with stricture 06/22/2006   With esophagitis, requiring dilatation 08/02/2006   . Irritable bowel syndrome   . Major depression 06/22/2006  . Obesity (BMI 30.0-34.9) 07/06/2012  . Osteoarthritis of left knee 03/07/2013  . Squamous cell skin cancer 07/06/2012   In situ   . Swelling of left lower extremity 12/07/2012   Responds to lasix 40 mg daily       Review of Systems:   See HPI  Physical Exam:  Vitals:   07/18/16 1428  BP: (!) 148/66  Pulse: 73  Temp: 98.3 F (36.8 C)  TempSrc: Oral  SpO2: 98%  Weight: 196 lb 4.8 oz (89 kg)  Height: 5' 6.5" (1.689 m)   Physical Exam  Constitutional: He is well-developed, well-nourished, and in no distress. No distress.  HENT:  Head: Normocephalic and atraumatic.  Cardiovascular: Normal rate, regular rhythm and normal heart sounds.   Pulmonary/Chest: Breath sounds normal. No respiratory distress. He has no wheezes. He has no rales.  Abdominal: Soft. Bowel sounds are normal.  Musculoskeletal:  1+ pitting edema to knees bilaterally   Skin:  Senile purpura present in bilateral upper extremities. No rashes.  Left foot with lateral plantar surface callous.     Assessment & Plan:   See Encounters Tab for problem based charting.  Patient discussed with Dr. Lynnae January

## 2016-07-19 ENCOUNTER — Other Ambulatory Visit: Payer: Self-pay | Admitting: *Deleted

## 2016-07-19 ENCOUNTER — Other Ambulatory Visit: Payer: Self-pay | Admitting: Internal Medicine

## 2016-07-19 DIAGNOSIS — G894 Chronic pain syndrome: Secondary | ICD-10-CM

## 2016-07-19 LAB — BMP8+ANION GAP
ANION GAP: 18 mmol/L (ref 10.0–18.0)
BUN/Creatinine Ratio: 19 (ref 10–24)
BUN: 34 mg/dL — AB (ref 8–27)
CALCIUM: 8.9 mg/dL (ref 8.6–10.2)
CHLORIDE: 98 mmol/L (ref 96–106)
CO2: 23 mmol/L (ref 18–29)
Creatinine, Ser: 1.76 mg/dL — ABNORMAL HIGH (ref 0.76–1.27)
GFR calc non Af Amer: 35 mL/min/{1.73_m2} — ABNORMAL LOW (ref >59)
GFR, EST AFRICAN AMERICAN: 40 mL/min/{1.73_m2} — AB (ref >59)
GLUCOSE: 99 mg/dL (ref 65–99)
POTASSIUM: 5 mmol/L (ref 3.5–5.2)
Sodium: 139 mmol/L (ref 134–144)

## 2016-07-19 NOTE — Telephone Encounter (Signed)
Methadone refill

## 2016-07-20 ENCOUNTER — Other Ambulatory Visit: Payer: Self-pay | Admitting: *Deleted

## 2016-07-20 ENCOUNTER — Ambulatory Visit: Payer: Medicare Other

## 2016-07-20 MED ORDER — METHADONE HCL 5 MG PO TABS: 5.0000 mg | ORAL_TABLET | Freq: Three times a day (TID) | ORAL | 0 refills | Status: DC

## 2016-07-20 NOTE — Telephone Encounter (Signed)
Done, faxed 3, called pharm, will mail 2/1

## 2016-07-20 NOTE — Telephone Encounter (Signed)
3 months refilled of this very chronic medication (through 10/17/16). I would like to see him in clinic sometime for myself within this time if that's available.

## 2016-07-20 NOTE — Progress Notes (Signed)
Internal Medicine Clinic Attending  Case discussed with Dr. Boswell at the time of the visit.  We reviewed the resident's history and exam and pertinent patient test results.  I agree with the assessment, diagnosis, and plan of care documented in the resident's note.  

## 2016-07-21 MED ORDER — LISINOPRIL 5 MG PO TABS: 5.0000 mg | ORAL_TABLET | Freq: Every day | ORAL | 1 refills | Status: DC

## 2016-08-01 ENCOUNTER — Ambulatory Visit (INDEPENDENT_AMBULATORY_CARE_PROVIDER_SITE_OTHER): Payer: Medicare Other | Admitting: Pulmonary Disease

## 2016-08-01 VITALS — BP 117/56 | HR 72 | Temp 98.0°F | Ht 67.0 in | Wt 193.1 lb

## 2016-08-01 DIAGNOSIS — I10 Essential (primary) hypertension: Secondary | ICD-10-CM

## 2016-08-01 DIAGNOSIS — N183 Chronic kidney disease, stage 3 unspecified: Secondary | ICD-10-CM

## 2016-08-01 DIAGNOSIS — I129 Hypertensive chronic kidney disease with stage 1 through stage 4 chronic kidney disease, or unspecified chronic kidney disease: Secondary | ICD-10-CM

## 2016-08-01 DIAGNOSIS — Z87891 Personal history of nicotine dependence: Secondary | ICD-10-CM | POA: Diagnosis not present

## 2016-08-01 DIAGNOSIS — R600 Localized edema: Secondary | ICD-10-CM | POA: Diagnosis not present

## 2016-08-01 DIAGNOSIS — M7989 Other specified soft tissue disorders: Secondary | ICD-10-CM

## 2016-08-01 DIAGNOSIS — Z79899 Other long term (current) drug therapy: Secondary | ICD-10-CM | POA: Diagnosis not present

## 2016-08-01 NOTE — Assessment & Plan Note (Signed)
Recheck BMP today.  

## 2016-08-01 NOTE — Patient Instructions (Signed)
Keep taking your prescriptions as prescribed Follow up as scheduled

## 2016-08-01 NOTE — Assessment & Plan Note (Signed)
Assessment: BP 117/56.   Plan: Continue lisinopril '5mg'$  daily

## 2016-08-01 NOTE — Assessment & Plan Note (Addendum)
Assessment: LE edema stable/improved.   Plan:  Continue Lasix '40mg'$  daily Echo pending

## 2016-08-01 NOTE — Progress Notes (Signed)
   CC: BLE swelling follow up  HPI:  Mr.Creston E Mullendore is a 83 y.o. man with history as noted below here for follow up of his BLE edema. He reports that his swelling has improved. No new issues otherwise  Past Medical History:  Diagnosis Date  . Alcoholism ([place])    Remote  . Anxiety 06/22/2006  . Aortic atherosclerosis ([place]) 02/26/2015   Seen on CT scan, currently asymptomatic  . Arthritis of hip 05/21/2010   s/p left hip total arthroplasty   . Benign prostatic hypertrophy with urinary obstruction 06/22/2006  . Cataract   . Chronic kidney disease, stage 3 11/08/2013  . Chronic pain disorder 06/22/2006  . Constipation due to pain medication 06/03/2011  . Diverticulosis of colon 06/22/2006  . Essential hypertension 06/22/2006  . Facial basal cell cancer 06/22/2006  . Gastritis 07/06/2012   Seen on EGD 08/02/2006   . Gastroesophageal reflux disease with stricture 06/22/2006   With esophagitis, requiring dilatation 08/02/2006   . Irritable bowel syndrome   . Major depression 06/22/2006  . Obesity (BMI 30.0-34.9) 07/06/2012  . Osteoarthritis of left knee 03/07/2013  . Squamous cell skin cancer 07/06/2012   In situ   . Swelling of left lower extremity 12/07/2012   Responds to lasix 40 mg daily       Review of Systems:   No fevers or chills No nausea/vomiting  Physical Exam:  Vitals:   08/01/16 1352  BP: (!) 117/56  Pulse: 72  Temp: 98 F (36.7 C)  TempSrc: Oral  SpO2: 97%  Weight: 193 lb 1.6 oz (87.6 kg)  Height: '5\' 7"'$  (1.702 m)   General Apperance: NAD HEENT: Normocephalic, atraumatic, anicteric sclera Neck: Supple, trachea midline Lungs: Clear to auscultation bilaterally. No wheezes, rhonchi or rales. Breathing comfortably Heart: Regular rate and rhythm, no murmur/rub/gallop Abdomen: Soft, nontender, nondistended, no rebound/guarding Extremities: Warm and well perfused, 1+ pitting edema to knees bilaterally Skin: No rashes or lesions Neurologic: Alert and interactive. No gross  deficits.   Assessment & Plan:   See Encounters Tab for problem based charting.  Patient discussed with Dr. Lynnae January

## 2016-08-02 LAB — BMP8+ANION GAP
Anion Gap: 18 mmol/L (ref 10.0–18.0)
BUN / CREAT RATIO: 19 (ref 10–24)
BUN: 33 mg/dL — AB (ref 8–27)
CALCIUM: 8.8 mg/dL (ref 8.6–10.2)
CHLORIDE: 97 mmol/L (ref 96–106)
CO2: 22 mmol/L (ref 18–29)
Creatinine, Ser: 1.73 mg/dL — ABNORMAL HIGH (ref 0.76–1.27)
GFR calc Af Amer: 41 mL/min/{1.73_m2} — ABNORMAL LOW (ref >59)
GFR calc non Af Amer: 35 mL/min/{1.73_m2} — ABNORMAL LOW (ref >59)
GLUCOSE: 95 mg/dL (ref 65–99)
POTASSIUM: 4.5 mmol/L (ref 3.5–5.2)
Sodium: 137 mmol/L (ref 134–144)

## 2016-08-03 ENCOUNTER — Other Ambulatory Visit: Payer: Self-pay | Admitting: *Deleted

## 2016-08-03 DIAGNOSIS — K5903 Drug induced constipation: Secondary | ICD-10-CM

## 2016-08-03 MED ORDER — IBUPROFEN 600 MG PO TABS: 600.0000 mg | ORAL_TABLET | Freq: Two times a day (BID) | ORAL | 5 refills | Status: DC

## 2016-08-03 MED ORDER — POLYETHYLENE GLYCOL 3350 17 GM/SCOOP PO POWD: 17.0000 g | Freq: Every day | ORAL | 11 refills | Status: AC

## 2016-08-03 NOTE — Telephone Encounter (Signed)
Patient requesting Ibuprofen to be scheduled every morning & HS, miralax scheduled every day. Both meds currently ordered "as needed".

## 2016-08-05 ENCOUNTER — Other Ambulatory Visit: Payer: Self-pay

## 2016-08-05 MED ORDER — FUROSEMIDE 40 MG PO TABS: 40.0000 mg | ORAL_TABLET | Freq: Every day | ORAL | 5 refills | Status: DC

## 2016-08-05 NOTE — Telephone Encounter (Signed)
Zachary [name] from [facility] requesting refilll on  furosemide (LASIX) 40 MG tablet

## 2016-08-05 NOTE — Progress Notes (Signed)
Internal Medicine Clinic Attending  Case discussed with Dr. Krall soon after the resident saw the patient.  We reviewed the resident's history and exam and pertinent patient test results.  I agree with the assessment, diagnosis, and plan of care documented in the resident's note. 

## 2016-08-12 ENCOUNTER — Ambulatory Visit (HOSPITAL_COMMUNITY): Payer: Medicare Other

## 2016-08-16 ENCOUNTER — Other Ambulatory Visit: Payer: Self-pay

## 2016-08-16 NOTE — Telephone Encounter (Signed)
Zachary [name] from [facility] requesting to be filled. methadone (DOLOPHINE) 5 MG tablet.

## 2016-08-16 NOTE — Telephone Encounter (Signed)
Pharmacy has scripts

## 2016-08-17 NOTE — Telephone Encounter (Signed)
Spoke w/ pharmacy and they verified that they do have scripts, attempted to call facility and lm for rtc

## 2016-08-18 ENCOUNTER — Telehealth: Payer: Self-pay

## 2016-08-18 NOTE — Telephone Encounter (Signed)
Request for fl2 received placed in MD box awaiting signature

## 2016-09-02 ENCOUNTER — Ambulatory Visit (HOSPITAL_COMMUNITY)
Admission: RE | Admit: 2016-09-02 | Discharge: 2016-09-02 | Disposition: A | Discharge status: Home / Self Care | Payer: Medicare Other | Source: Ambulatory Visit | Attending: Internal Medicine | Admitting: Internal Medicine

## 2016-09-02 ENCOUNTER — Ambulatory Visit (INDEPENDENT_AMBULATORY_CARE_PROVIDER_SITE_OTHER): Payer: Medicare Other | Admitting: Internal Medicine

## 2016-09-02 ENCOUNTER — Encounter: Payer: Self-pay | Admitting: Internal Medicine

## 2016-09-02 VITALS — BP 138/56 | HR 68 | Temp 98.0°F | Wt 195.0 lb

## 2016-09-02 DIAGNOSIS — M7989 Other specified soft tissue disorders: Secondary | ICD-10-CM

## 2016-09-02 DIAGNOSIS — I129 Hypertensive chronic kidney disease with stage 1 through stage 4 chronic kidney disease, or unspecified chronic kidney disease: Secondary | ICD-10-CM | POA: Diagnosis not present

## 2016-09-02 DIAGNOSIS — E669 Obesity, unspecified: Secondary | ICD-10-CM | POA: Insufficient documentation

## 2016-09-02 DIAGNOSIS — L570 Actinic keratosis: Secondary | ICD-10-CM | POA: Diagnosis not present

## 2016-09-02 DIAGNOSIS — Z87891 Personal history of nicotine dependence: Secondary | ICD-10-CM

## 2016-09-02 DIAGNOSIS — Z79899 Other long term (current) drug therapy: Secondary | ICD-10-CM

## 2016-09-02 DIAGNOSIS — Z79891 Long term (current) use of opiate analgesic: Secondary | ICD-10-CM

## 2016-09-02 DIAGNOSIS — R0609 Other forms of dyspnea: Secondary | ICD-10-CM | POA: Diagnosis not present

## 2016-09-02 DIAGNOSIS — N183 Chronic kidney disease, stage 3 (moderate): Secondary | ICD-10-CM

## 2016-09-02 DIAGNOSIS — L989 Disorder of the skin and subcutaneous tissue, unspecified: Secondary | ICD-10-CM | POA: Diagnosis not present

## 2016-09-02 DIAGNOSIS — Z85828 Personal history of other malignant neoplasm of skin: Secondary | ICD-10-CM | POA: Diagnosis not present

## 2016-09-02 DIAGNOSIS — I10 Essential (primary) hypertension: Secondary | ICD-10-CM

## 2016-09-02 DIAGNOSIS — G894 Chronic pain syndrome: Secondary | ICD-10-CM | POA: Diagnosis not present

## 2016-09-02 DIAGNOSIS — Z6830 Body mass index (BMI) 30.0-30.9, adult: Secondary | ICD-10-CM | POA: Insufficient documentation

## 2016-09-02 DIAGNOSIS — C44310 Basal cell carcinoma of skin of unspecified parts of face: Secondary | ICD-10-CM

## 2016-09-02 DIAGNOSIS — Z0189 Encounter for other specified special examinations: Secondary | ICD-10-CM | POA: Diagnosis present

## 2016-09-02 MED ORDER — DICLOFENAC SODIUM 1 % TD GEL: 4.0000 g | Freq: Four times a day (QID) | TRANSDERMAL | 4 refills | Status: AC | PRN

## 2016-09-02 NOTE — Progress Notes (Signed)
  Echocardiogram 2D Echocardiogram has been performed.  [place] Leap L Androw 09/02/2016, 12:16 PM

## 2016-09-02 NOTE — Patient Instructions (Signed)
It was a pleasure to see you today Zachary [name].

## 2016-09-02 NOTE — Progress Notes (Signed)
   CC: Follow up for leg swelling and encounter with new PCP  HPI:  [name] is a 83 y.o. man here for his ongoing bilateral leg swelling.  See problem based assessment and plan below for additional details  Past Medical History:  Diagnosis Date  . Alcoholism ([place])    Remote  . Anxiety 06/22/2006  . Aortic atherosclerosis ([place]) 02/26/2015   Seen on CT scan, currently asymptomatic  . Arthritis of hip 05/21/2010   s/p left hip total arthroplasty   . Benign prostatic hypertrophy with urinary obstruction 06/22/2006  . Cataract   . Chronic kidney disease, stage 3 11/08/2013  . Chronic pain disorder 06/22/2006  . Constipation due to pain medication 06/03/2011  . Diverticulosis of colon 06/22/2006  . Essential hypertension 06/22/2006  . Facial basal cell cancer 06/22/2006  . Gastritis 07/06/2012   Seen on EGD 08/02/2006   . Gastroesophageal reflux disease with stricture 06/22/2006   With esophagitis, requiring dilatation 08/02/2006   . Irritable bowel syndrome   . Major depression 06/22/2006  . Obesity (BMI 30.0-34.9) 07/06/2012  . Osteoarthritis of left knee 03/07/2013  . Squamous cell skin cancer 07/06/2012   In situ   . Swelling of left lower extremity 12/07/2012   Responds to lasix 40 mg daily       Review of Systems:  Review of Systems  Constitutional: Negative for chills and fever.  HENT: Positive for congestion.   Eyes: Positive for pain and redness.  Respiratory: Positive for cough.   Cardiovascular: Positive for leg swelling. Negative for chest pain.  Genitourinary: Negative for dysuria, flank pain and hematuria.  Skin: Negative for rash.  Neurological: Negative for dizziness and sensory change.  Endo/Heme/Allergies: Bruises/bleeds easily.  Psychiatric/Behavioral: Negative for depression.    Physical Exam: General Apperance: Not in acute distress HEENT: Normocephalic, atraumati Neck: Supple Lungs: Clear to auscultation bilaterally. No wheezes, rhonchi or rales. Normal  WOB Heart: Regular rate and rhythm, no murmur/rub/gallop Abdomen: Soft, nontender, nondistended, no rebound/guarding Extremities: Warm and well perfused, 1+ pitting edema to knees bilaterally Skin: No rashes or lesions Neurologic: Alert and interactive. No gross deficits.  Vitals:   09/02/16 1334  BP: (!) 159/74  Pulse: 70  Temp: 98 F (36.7 C)  TempSrc: Oral  SpO2: 97%  Weight: 195 lb (88.5 kg)     Assessment & Plan:   See Encounters Tab for problem based charting.  Patient discussed with Dr. Angelia Mould

## 2016-09-03 LAB — URINALYSIS, ROUTINE W REFLEX MICROSCOPIC
Bilirubin, UA: NEGATIVE
GLUCOSE, UA: NEGATIVE
KETONES UA: NEGATIVE
LEUKOCYTES UA: NEGATIVE
NITRITE UA: NEGATIVE
Protein, UA: NEGATIVE
RBC, UA: NEGATIVE
SPEC GRAV UA: 1.007 (ref 1.005–1.030)
Urobilinogen, Ur: 0.2 mg/dL (ref 0.2–1.0)
pH, UA: 5 (ref 5.0–7.5)

## 2016-09-05 NOTE — Assessment & Plan Note (Signed)
Assessment: Blood pressure is controlled today at 138/56 with a target of less than 140/90  Plan: Continue lisinopril 5 mg daily

## 2016-09-05 NOTE — Progress Notes (Signed)
Internal Medicine Clinic Attending  Case discussed with Dr. Rice at the time of the visit.  We reviewed the resident's history and exam and pertinent patient test results.  I agree with the assessment, diagnosis, and plan of care documented in the resident's note.  

## 2016-09-05 NOTE — Assessment & Plan Note (Signed)
HPI: Extremity edema is present again today although very mild. This is improved with taking the Lasix 40 mg daily. He had a transthoracic echocardiogram completed this morning. The results of this were very benign with a roughly normal left ventricular ejection fraction and no major diastolic dysfunction or valvular disorder.  A: Mild stable lower extremity edema that is mostly improved on 40 mg Lasix daily History see how much workup the only thing I do not see is a recent urinalysis/microalbumin to assess for proteinuria although I do not strongly suspect nephrotic syndrome Business to most likely explanation would be some chronic venous insufficiency which would not be an unusual finding in an 83 year old  P: Check urinalysis today for gross abnormalities Continue Lasix 40 mg daily Recommended compression stocking and leg elevation as tolerated at his ALF

## 2016-09-05 NOTE — Assessment & Plan Note (Signed)
Assessment: Mostly well-controlled on chronic opioid narcotics but he does continue to have mostly knee pain because of breakthrough problem. He's taking ibuprofen regularly for this pain which he does think helps.  Plan: I would like to try topical Voltaren gel to decrease his total systemic NSAIDs exposure given his chronic kidney disease stage III. Ordered Voltaren gel 1% 4g up to 4 times daily as needed to knees for breakthrough pain

## 2016-09-05 NOTE — Assessment & Plan Note (Signed)
HPI: He has numerous small skin lesions and some actinic keratoses scattered on his face and upper extremities and neck. He is present but followed at dermatology Dr. Denna Haggard since years ago but no recent visits. Denies any pain or acute changes associated with these many spots. He does say similar once a been surgically excised by his dermatologist in the past particularly on the scalp.  A: Numerous skin lesions patient with basal cell skin cancer history without acute changes but in need of dermatology follow-up He has not been seen in he estimates about 2 years so we may replace a new referral at this time  P: Ambulatory referral to dermatology placed today

## 2016-09-20 ENCOUNTER — Other Ambulatory Visit: Payer: Self-pay | Admitting: *Deleted

## 2016-09-20 DIAGNOSIS — G894 Chronic pain syndrome: Secondary | ICD-10-CM

## 2016-09-21 MED ORDER — METHADONE HCL 5 MG PO TABS: 5.0000 mg | ORAL_TABLET | Freq: Three times a day (TID) | ORAL | 0 refills | Status: DC

## 2016-09-21 NOTE — Telephone Encounter (Signed)
Reordered x3 months supply for pain medication on 4/4. Oh note first refill should be 4/30 based on previous refill order. The facility would benefit from having this in advance.

## 2016-09-29 NOTE — Telephone Encounter (Signed)
Zachary [name] can you please close this enounter

## 2016-10-11 ENCOUNTER — Other Ambulatory Visit: Payer: Self-pay | Admitting: Internal Medicine

## 2016-10-11 DIAGNOSIS — J209 Acute bronchitis, unspecified: Secondary | ICD-10-CM

## 2016-10-13 ENCOUNTER — Other Ambulatory Visit: Payer: Self-pay

## 2016-10-13 NOTE — Telephone Encounter (Signed)
Nadia from [facility] requesting to speak with a nurse about    methadone (DOLOPHINE) 5 MG tablet.

## 2016-10-17 ENCOUNTER — Telehealth: Payer: Self-pay | Admitting: Internal Medicine

## 2016-10-25 NOTE — Telephone Encounter (Signed)
Pharmacy has scripts

## 2016-11-10 ENCOUNTER — Telehealth: Payer: Self-pay

## 2016-11-10 NOTE — Telephone Encounter (Signed)
Reports that patient is being discharged from their facility because he is not a good fit he needs FL2 completed ASAP because they do not have anywhere to send him without it. Advised that I would fill FL@ and route to MD for signature

## 2016-11-11 ENCOUNTER — Ambulatory Visit (INDEPENDENT_AMBULATORY_CARE_PROVIDER_SITE_OTHER): Payer: Medicare Other | Admitting: Internal Medicine

## 2016-11-11 ENCOUNTER — Encounter: Payer: Self-pay | Admitting: Internal Medicine

## 2016-11-11 VITALS — BP 142/68 | HR 72 | Temp 98.0°F | Ht 67.0 in | Wt 195.6 lb

## 2016-11-11 DIAGNOSIS — Z79899 Other long term (current) drug therapy: Secondary | ICD-10-CM | POA: Diagnosis not present

## 2016-11-11 DIAGNOSIS — T402X5D Adverse effect of other opioids, subsequent encounter: Secondary | ICD-10-CM

## 2016-11-11 DIAGNOSIS — Z79891 Long term (current) use of opiate analgesic: Secondary | ICD-10-CM | POA: Diagnosis not present

## 2016-11-11 DIAGNOSIS — G894 Chronic pain syndrome: Secondary | ICD-10-CM

## 2016-11-11 DIAGNOSIS — I10 Essential (primary) hypertension: Secondary | ICD-10-CM

## 2016-11-11 DIAGNOSIS — C4492 Squamous cell carcinoma of skin, unspecified: Secondary | ICD-10-CM

## 2016-11-11 DIAGNOSIS — Z87891 Personal history of nicotine dependence: Secondary | ICD-10-CM | POA: Diagnosis not present

## 2016-11-11 DIAGNOSIS — K5903 Drug induced constipation: Secondary | ICD-10-CM | POA: Diagnosis not present

## 2016-11-11 NOTE — Patient Instructions (Signed)
It was a pleasure to see you today [name] [name].  I have recommended a slight decrease to your bowel medications for your symptoms of uncomfortable loose bowel movements. If you stop having daily bowel movements with this change please go back to the previous dose.  We are checking your urine sample today for your prescription medications. We will contact you if there are any problems.  Your blood pressure is doing well today. We can see you again in a few months unless you need [place] sooner.

## 2016-11-11 NOTE — Progress Notes (Signed)
   CC: Follow up for hypertension, with new complaint of loose stools  HPI:  Zachary [name] is a 83 y.o. man here for medication refills and about daily loose bowel movements that have become more urgent lately with some abdominal soreness.  See problem based assessment and plan below for additional details  Past Medical History:  Diagnosis Date  . Alcoholism ([place])    Remote  . Anxiety 06/22/2006  . Aortic atherosclerosis ([place]) 02/26/2015   Seen on CT scan, currently asymptomatic  . Arthritis of hip 05/21/2010   s/p left hip total arthroplasty   . Benign prostatic hypertrophy with urinary obstruction 06/22/2006  . Cataract   . Chronic kidney disease, stage 3 11/08/2013  . Chronic pain disorder 06/22/2006  . Constipation due to pain medication 06/03/2011  . Diverticulosis of colon 06/22/2006  . Essential hypertension 06/22/2006  . Facial basal cell cancer 06/22/2006  . Gastritis 07/06/2012   Seen on EGD 08/02/2006   . Gastroesophageal reflux disease with stricture 06/22/2006   With esophagitis, requiring dilatation 08/02/2006   . Irritable bowel syndrome   . Major depression 06/22/2006  . Obesity (BMI 30.0-34.9) 07/06/2012  . Osteoarthritis of left knee 03/07/2013  . Squamous cell skin cancer 07/06/2012   In situ   . Swelling of left lower extremity 12/07/2012   Responds to lasix 40 mg daily       Review of Systems:  Review of Systems  Constitutional: Negative for chills, fever and weight loss.  Eyes: Negative for blurred vision.  Respiratory: Negative for shortness of breath.   Cardiovascular: Positive for leg swelling.  Gastrointestinal: Positive for abdominal pain and diarrhea. Negative for blood in stool, melena and nausea.  Neurological: Negative for dizziness, focal weakness and headaches.    Physical Exam: Physical Exam  Cardiovascular: Normal rate and regular rhythm.   Pulmonary/Chest: Effort normal and breath sounds normal.  Abdominal: Soft. Bowel sounds are normal. He exhibits  no distension. There is tenderness.  Musculoskeletal: He exhibits edema.  Skin:  Hundreds of small noninflammatory skin lesions throughout, more on sun exposed areas    Vitals:   11/11/16 1322 11/12/16 1239  BP: (!) 175/83 (!) 142/68  Pulse: 73 72  Temp: 98 F (36.7 C)   TempSrc: Oral   SpO2: 100%   Weight: 195 lb 9.6 oz (88.7 kg)   Height: 5\' 7"  (1.702 m)     Assessment & Plan:   See Encounters Tab for problem based charting.  Patient discussed with Dr. Evette Doffing

## 2016-11-13 NOTE — Assessment & Plan Note (Signed)
HPI: Blood pressure is 142/68 in clinic today. He has no obvious symptoms and denies reproducible dizziness with postural changes.  Assessment: BP adequately controlled with goal under 150/90 for age and comorbidities  P: Continue lisinopril 5mg  PO daily

## 2016-11-13 NOTE — Assessment & Plan Note (Signed)
Zachary [name] is on chronic opioid therapy for chronic pain. The date of the controlled substances contract is referenced in the [place] and / or the overview. Date of pain contract was 11/04/2010. His medications are obtained and administered per [employer] but were not directly available for review in the [place].  The last UDS was on not available for review so we are obtaining this today. He will need at least annual screening in the future.   The patient is on methadone strength 5mg  PO q8hrs, #90 tablets per 30 days. Adjunctive treatment includes previous tramadol and NSAID's. This regimen allows Zachary [name] to function and does not cause excessive sedation or other side effects.  "The benefits of continuing opioid therapy outweigh the risks and chronic opioids will be continued. Ongoing education about safe opioid treatment is provided  Interventions today include: UDS His scripts are filled routinely via direct fax and mail to pharmacy for filling and administration at ALF.

## 2016-11-13 NOTE — Assessment & Plan Note (Addendum)
HPI: He reports loose, urgent bowel movements frequently over the past few weeks. These are about 1-2 times per day. He denies any blood or discoloration of his stools. He has mild abdominal soreness and bloating but is not having to strain as he noticed in the past when constipated. He is taking miralax 17g and senna x4 qHS regularly at his facility for a bowel regimen while on chronic pain medication.  A: Appropriate frequency of stools on current bowel regimen, although this urgency and looseness could be dietary versus medication induced.  P: Recommended decreasing senna dose to half and see if this helps his urgency. If he stops having daily bowel movements he will need to resume his previous dose.

## 2016-11-13 NOTE — Assessment & Plan Note (Addendum)
HPI: He has hundreds of skin lesions all over especially in sun exposed areas. There is a history of biopsy identified squamous cell cancer in situ. He was recently re-established with dermatology by referral from our office. They recommend a series of 3 visits with goals being to treat the most bothersome of his sites as there is no reasonable possibility for eradication of all his small cancerous or pre-cancerous lesions.  A: Numerous diffuse actinic keratoses, BCC, and possibly small SCC. I think this is a very reasonable approach for symptomatic treatment.  P: Follow up in June per dermatology recs Copy of outside office records to be scanned into media today

## 2016-11-15 ENCOUNTER — Other Ambulatory Visit: Payer: Self-pay | Admitting: Internal Medicine

## 2016-11-15 NOTE — Telephone Encounter (Signed)
METHADONE 5MG .,

## 2016-11-15 NOTE — Progress Notes (Signed)
Internal Medicine Clinic Attending  Case discussed with Dr. Rice at the time of the visit.  We reviewed the resident's history and exam and pertinent patient test results.  I agree with the assessment, diagnosis, and plan of care documented in the resident's note.  

## 2016-11-16 NOTE — Telephone Encounter (Signed)
Call made to [facility] still has 2 prescriptions one to filled on 5/31 and the other one on 07/01.  No further action needed at this time.Marland KitchenMarland KitchenRegenia Skeeter, Gussie Murton Cassady5/30/201811:38 AM   Called back to [facility] to make them aware that refill has been taken care of.  While on the phone Seth Bake asked to speak to me regarding a FL-2.  States pt is currently in the "eviction" phase and in the process of being relocated, but pt is refusing to leave.  [facility] needs all paperwork completed on patient prior eviction.  Will speak with [facility] care management nurse for follow up.Despina Hidden Cassady5/30/201811:45 AM

## 2016-11-20 LAB — TOXASSURE SELECT,+ANTIDEPR,UR

## 2016-12-16 ENCOUNTER — Telehealth: Payer: Self-pay

## 2016-12-16 DIAGNOSIS — G894 Chronic pain syndrome: Secondary | ICD-10-CM

## 2016-12-16 NOTE — Telephone Encounter (Signed)
Rx previously faxed has enough refills until 01/17/17, patient informed.

## 2016-12-16 NOTE — Telephone Encounter (Signed)
Has refill until July 31/18.

## 2016-12-16 NOTE — Telephone Encounter (Signed)
  methadone (DOLOPHINE) 5 MG tablet, refill request.

## 2016-12-19 NOTE — Telephone Encounter (Signed)
Dr Benjamine Mola I called the pharmacy, [facility], the 09/2016 scripts were filled as follows: 4/1 #90 4/30 #90 5/31 #45 6/18 #45 Could we do 3 scripts easier for triage and pharmacy to keep up with need. thanks

## 2016-12-22 NOTE — Telephone Encounter (Signed)
Spoke w/ pharmacist at [facility] care, last script filled Tuesday 7/3, will ask for 3 scripts at end of july

## 2017-01-04 ENCOUNTER — Other Ambulatory Visit: Payer: Self-pay | Admitting: Internal Medicine

## 2017-01-06 ENCOUNTER — Telehealth: Payer: Self-pay | Admitting: *Deleted

## 2017-01-06 NOTE — Telephone Encounter (Signed)
Received call from [facility] assisted [facility].  They are requesting an order for refresh 0.5% eyedrops one drop in each eye up to four times a day.  Pt was already getting another brand of otc eyedrops, but company is no longer making that brand. Although medication is avail otc, it requires an order since pt resides in an assisted living facility.  Attempted to give verbal authorization over the phone, but they require a written order.  They have already faxed an order request to our office yesterday.  Pt's pcp will be in later today to review the request.[name], Zachary Cassady7/20/201811:37 AM

## 2017-01-13 ENCOUNTER — Other Ambulatory Visit: Payer: Self-pay | Admitting: *Deleted

## 2017-01-13 MED ORDER — FUROSEMIDE 40 MG PO TABS: 40.0000 mg | ORAL_TABLET | Freq: Every day | ORAL | 5 refills | Status: AC

## 2017-01-13 NOTE — Telephone Encounter (Signed)
[facility] nurse requesting script. They ususally send fax request but can not locate @ this time.  (I will fax the script [phone]) Thanks!

## 2017-01-16 ENCOUNTER — Other Ambulatory Visit: Payer: Self-pay | Admitting: *Deleted

## 2017-01-16 DIAGNOSIS — G894 Chronic pain syndrome: Secondary | ICD-10-CM

## 2017-01-17 ENCOUNTER — Other Ambulatory Visit: Payer: Self-pay | Admitting: *Deleted

## 2017-01-17 NOTE — Telephone Encounter (Signed)
methadone requested yesterday, informed facility that pharmacy will notify them when it is ready

## 2017-01-18 ENCOUNTER — Encounter: Payer: Self-pay | Admitting: *Deleted

## 2017-01-18 MED ORDER — METHADONE HCL 5 MG PO TABS: 5.0000 mg | ORAL_TABLET | Freq: Three times a day (TID) | ORAL | 0 refills | Status: DC

## 2017-01-18 NOTE — Telephone Encounter (Signed)
Methadone scripts (x2 faxed), Spoke to [facility] (pharmacy) & verified scripts has been received. Mailed today.

## 2017-01-23 ENCOUNTER — Other Ambulatory Visit: Payer: Self-pay | Admitting: Internal Medicine

## 2017-02-14 ENCOUNTER — Other Ambulatory Visit: Payer: Self-pay

## 2017-02-14 DIAGNOSIS — G894 Chronic pain syndrome: Secondary | ICD-10-CM

## 2017-02-14 NOTE — Telephone Encounter (Signed)
Dr rice, pharm has the next refill coming up for pt but will be out of scripts then, just fyi

## 2017-02-14 NOTE — Telephone Encounter (Signed)
methadone (DOLOPHINE) 5 MG tablet, refill request.

## 2017-03-03 ENCOUNTER — Encounter: Payer: Medicare Other | Admitting: Internal Medicine

## 2017-03-07 MED ORDER — METHADONE HCL 5 MG PO TABS: 5.0000 mg | ORAL_TABLET | Freq: Three times a day (TID) | ORAL | 0 refills | Status: DC

## 2017-03-09 ENCOUNTER — Telehealth: Payer: Self-pay

## 2017-03-09 NOTE — Telephone Encounter (Signed)
methadone (DOLOPHINE) 5 MG tablet, refill request.

## 2017-03-09 NOTE — Telephone Encounter (Signed)
This has been done will fax and mail

## 2017-03-14 ENCOUNTER — Telehealth: Payer: Self-pay | Admitting: Internal Medicine

## 2017-03-14 NOTE — Telephone Encounter (Signed)
Pls call Zachary [name] pt Med tech regarding reills

## 2017-03-20 ENCOUNTER — Telehealth: Payer: Self-pay | Admitting: *Deleted

## 2017-03-20 DIAGNOSIS — G894 Chronic pain syndrome: Secondary | ICD-10-CM

## 2017-03-20 MED ORDER — METHADONE HCL 5 MG PO TABS: 5.0000 mg | ORAL_TABLET | Freq: Three times a day (TID) | ORAL | 0 refills | Status: DC

## 2017-03-20 NOTE — Telephone Encounter (Signed)
Call from [facility] - stated they nor the pharmacy have received Methadone rxs. Stated they have called several times. And pt only has 1 pill left. Informed accordingly to chart "this has been done will fax and mail" per [facility]. Informed Bonnita Nasuti is not here today;will be back tomorrow. I'm unable to locate rxs. Also pt's folder. Called/talked to Dr Benjamine Mola - stated he will write another rx for 1 week until original rxs can be located.

## 2017-03-20 NOTE — Telephone Encounter (Signed)
Methadone rx written today -  qty # 15 - per Dr Benjamine Mola faxed to [facility]. Called [facility] - faxed received. Also informed Kaleen Mask, about new rx. Rx mailed to [facility].

## 2017-03-20 NOTE — Telephone Encounter (Signed)
Call made to [facility] to see if they have received rxs and they have not.  Last script on file was dated for 02/18/17 #90.Despina Hidden Cassady10/1/20184:10 PM

## 2017-03-23 ENCOUNTER — Other Ambulatory Visit: Payer: Self-pay

## 2017-03-23 DIAGNOSIS — J302 Other seasonal allergic rhinitis: Secondary | ICD-10-CM

## 2017-03-23 NOTE — Telephone Encounter (Signed)
fluticasone (FLONASE) 50 MCG/ACT nasal spray, refill request.

## 2017-03-24 ENCOUNTER — Telehealth: Payer: Self-pay | Admitting: Internal Medicine

## 2017-03-24 ENCOUNTER — Other Ambulatory Visit: Payer: Self-pay | Admitting: Internal Medicine

## 2017-03-24 DIAGNOSIS — J302 Other seasonal allergic rhinitis: Secondary | ICD-10-CM

## 2017-03-24 MED ORDER — FLUTICASONE PROPIONATE 50 MCG/ACT NA SUSP: 2.0000 | Freq: Every day | NASAL | 3 refills | Status: DC

## 2017-03-24 NOTE — Telephone Encounter (Signed)
NEEDS REFILL METHADOME 5MG , FLONASE NASAL SPRAY, [phone]-[facility], HE IS OUT OF BOTH, CALL IN TO [phone]

## 2017-03-27 ENCOUNTER — Telehealth: Payer: Self-pay

## 2017-03-27 NOTE — Telephone Encounter (Signed)
methadone (DOLOPHINE) 5 MG tablet, refill request.

## 2017-03-28 ENCOUNTER — Other Ambulatory Visit: Payer: Self-pay | Admitting: *Deleted

## 2017-03-28 DIAGNOSIS — G894 Chronic pain syndrome: Secondary | ICD-10-CM

## 2017-03-28 MED ORDER — METHADONE HCL 5 MG PO TABS: 5.0000 mg | ORAL_TABLET | Freq: Three times a day (TID) | ORAL | 0 refills | Status: DC

## 2017-03-29 NOTE — Telephone Encounter (Signed)
Both are done

## 2017-03-29 NOTE — Telephone Encounter (Signed)
done

## 2017-03-29 NOTE — Telephone Encounter (Signed)
Faxed and mailed to [facility]

## 2017-03-30 ENCOUNTER — Encounter: Payer: Self-pay | Admitting: *Deleted

## 2017-03-30 ENCOUNTER — Other Ambulatory Visit: Payer: Self-pay | Admitting: *Deleted

## 2017-03-30 DIAGNOSIS — G894 Chronic pain syndrome: Secondary | ICD-10-CM

## 2017-03-30 MED ORDER — METHADONE HCL 5 MG PO TABS: 5.0000 mg | ORAL_TABLET | Freq: Three times a day (TID) | ORAL | 0 refills | Status: DC

## 2017-03-31 ENCOUNTER — Encounter: Payer: Medicare Other | Admitting: Internal Medicine

## 2017-04-18 ENCOUNTER — Other Ambulatory Visit: Payer: Self-pay | Admitting: *Deleted

## 2017-04-18 DIAGNOSIS — G894 Chronic pain syndrome: Secondary | ICD-10-CM

## 2017-04-19 ENCOUNTER — Other Ambulatory Visit: Payer: Self-pay | Admitting: *Deleted

## 2017-04-19 DIAGNOSIS — G894 Chronic pain syndrome: Secondary | ICD-10-CM

## 2017-04-19 NOTE — Telephone Encounter (Signed)
[facility]ING [facility]:  [facility] [address] [place], [place] [address]  PH#: [phone] PBD: [phone]  CONTACT PERSON: CHRISTINA, [place] EXT: [id number]  WE WILL CONTINUE TO FAX THE SCRIPTS, CALL TO VERIFY THAT [facility] RECEIVED THEM AND THEN MAIL TO ADDRESS ABOVE

## 2017-04-20 MED ORDER — METHADONE HCL 5 MG PO TABS: 5.0000 mg | ORAL_TABLET | Freq: Three times a day (TID) | ORAL | 0 refills | Status: DC

## 2017-04-20 NOTE — Telephone Encounter (Signed)
Faxed script. Verified with Elta Guadeloupe that it has been received. Per Elta Guadeloupe, no need to mail original script, fax is sufficient.

## 2017-04-28 ENCOUNTER — Ambulatory Visit (INDEPENDENT_AMBULATORY_CARE_PROVIDER_SITE_OTHER): Payer: Medicare Other | Admitting: Internal Medicine

## 2017-04-28 ENCOUNTER — Other Ambulatory Visit: Payer: Self-pay | Admitting: Internal Medicine

## 2017-04-28 ENCOUNTER — Encounter: Payer: Self-pay | Admitting: Internal Medicine

## 2017-04-28 ENCOUNTER — Other Ambulatory Visit: Payer: Self-pay

## 2017-04-28 VITALS — BP 138/70 | HR 79 | Temp 98.4°F | Ht 67.0 in | Wt 206.0 lb

## 2017-04-28 DIAGNOSIS — N183 Chronic kidney disease, stage 3 unspecified: Secondary | ICD-10-CM

## 2017-04-28 DIAGNOSIS — R05 Cough: Secondary | ICD-10-CM

## 2017-04-28 DIAGNOSIS — T40605A Adverse effect of unspecified narcotics, initial encounter: Secondary | ICD-10-CM

## 2017-04-28 DIAGNOSIS — K219 Gastro-esophageal reflux disease without esophagitis: Secondary | ICD-10-CM

## 2017-04-28 DIAGNOSIS — Z0000 Encounter for general adult medical examination without abnormal findings: Secondary | ICD-10-CM | POA: Diagnosis not present

## 2017-04-28 DIAGNOSIS — K5903 Drug induced constipation: Secondary | ICD-10-CM | POA: Diagnosis not present

## 2017-04-28 DIAGNOSIS — K222 Esophageal obstruction: Secondary | ICD-10-CM

## 2017-04-28 DIAGNOSIS — F419 Anxiety disorder, unspecified: Secondary | ICD-10-CM

## 2017-04-28 DIAGNOSIS — G894 Chronic pain syndrome: Secondary | ICD-10-CM | POA: Diagnosis not present

## 2017-04-28 DIAGNOSIS — R059 Cough, unspecified: Secondary | ICD-10-CM

## 2017-04-28 DIAGNOSIS — H04123 Dry eye syndrome of bilateral lacrimal glands: Secondary | ICD-10-CM | POA: Diagnosis not present

## 2017-04-28 NOTE — Progress Notes (Signed)
Dictation #1 [id number]  [id number]   CC: Check up, cough, arthritis  HPI:  Zachary [name] is a 83 y.o. male with PMHx detailed below presenting with increased cough that is worst after eating and sometimes productive of sputum and severe enough to cause chest pain.  See problem based assessment and plan below for additional details.  Cough He has been coughing more for the past 3 months. This is prominently after eating meals although he denies feeling that he is choking. His trouble with swallowing and coughing is enough that he has stopped eating lunch regularly and just drinks ensure more of the day. He denies shortness of breath although the coughing is sometimes enough to cause chest pain. A: Cough associated with dysphagia is very concerning for aspiration or near aspiration events. He has a significant history of GERD with inflammation so associated changes such as stricture as a possibility. However given the history of pretty immediate coughing without any regurgitation I am more concerned for upper motility problems. P: Referral for MBBS  Healthcare maintenance He already received a flu vaccine on 04/05/2017  Chronic pain disorder Zachary [name] is on chronic opioid therapy for chronic pain. The date of the controlled substances contract is referenced in the [place] and / or the overview. Date of pain contract was 11/04/2010. His medications are obtained and administered per [facility]. He has not received any PRN tramadol within the past week per [facility].  The last UDS was on 11/11/2016 and results shows a unexpected due to not taking his PRN tramadol. Patient needs at least a yearly UDS.   The patient is on tramadol (Ultram) and methadone strength 5mg  q8hrs , 90 per 30 days. Adjunctive treatment includes NSAID's. This regimen allows Zachary [name] to function and does not cause excessive sedation or other side effects.  He does have some degree of constipation  which was well controlled on 4 tablets of sennosides daily but has been more symptomatic after halving this dose.  "The benefits of continuing opioid therapy outweigh the risks and chronic opioids will be continued. Ongoing education about safe opioid treatment is provided  Interventions today include: UDS and Refills - 3 paper Rx printed      Constipation due to pain medication He was having more than daily stools on 4 tablets daily so this dose was halved and allowed PRN miralax. He is now regularly having constipation so we will increase sennosides to 3 tablets qHS.  Anxiety He appears to be in stable condition. He has not required any PRN benzodiazepines for months and is doing well on scheduled doses. I will discontinue PRN medication in the [facility] as he is not needing it, this woiuld have potential for more respiratory complications, and we can reassess his anxiety if needed.  Chronic kidney disease, stage 3 Previously stable CKD3. We will recheck metabolic panel today given his reduced intake and medications.  Dry eyes He is having very frequent dryness and irritation of the left eye which does not approximate well after medial edge required skin tumor excision. He is requesting the eyedrops as often as PRN prescribing and frustrated at the delays in getting them at [facility].  I will change this prescription to scheduled Recommended he follow up with his opthalmologist for repeat evaluation    Past Medical History:  Diagnosis Date  . Alcoholism ([place])    Remote  . Anxiety 06/22/2006  . Aortic atherosclerosis ([place]) 02/26/2015   Seen on CT scan, currently  asymptomatic  . Arthritis of hip 05/21/2010   s/p left hip total arthroplasty   . Benign prostatic hypertrophy with urinary obstruction 06/22/2006  . Cataract   . Chronic kidney disease, stage 3 ([place]) 11/08/2013  . Chronic pain disorder 06/22/2006  . Constipation due to pain medication 06/03/2011  . Diverticulosis of colon 06/22/2006   . Essential hypertension 06/22/2006  . Facial basal cell cancer 06/22/2006  . Gastritis 07/06/2012   Seen on EGD 08/02/2006   . Gastroesophageal reflux disease with stricture 06/22/2006   With esophagitis, requiring dilatation 08/02/2006   . Irritable bowel syndrome   . Major depression 06/22/2006  . Obesity (BMI 30.0-34.9) 07/06/2012  . Osteoarthritis of left knee 03/07/2013  . Squamous cell skin cancer 07/06/2012   In situ   . Swelling of left lower extremity 12/07/2012   Responds to lasix 40 mg daily       Review of Systems: Review of Systems  Constitutional: Negative for chills and fever.  HENT: Negative for congestion and sore throat.   Respiratory: Positive for cough and sputum production. Negative for shortness of breath.   Cardiovascular: Positive for chest pain.  Gastrointestinal: Negative for abdominal pain and diarrhea.  Musculoskeletal: Negative for falls and myalgias.  Skin: Negative for rash.  Neurological: Negative for dizziness.  Psychiatric/Behavioral: Negative for depression.     Physical Exam: Vitals:   04/28/17 1350  BP: 138/70  Pulse: 79  Temp: 98.4 F (36.9 C)  TempSrc: Oral  SpO2: 98%  Weight: 206 lb (93.4 kg)  Height: 5\' 7"  (1.702 m)   GENERAL- alert, co-operative, NAD HEENT- Oropharynx is clear, no cervical lymphadenopathy or tenderness CARDIAC- RRR, no murmurs, rubs or gallops. RESP- CTAB, no wheezes or crackles. ABDOMEN- Soft, nontender, no guarding or rebound, normoactive bowel sounds present EXTREMITIES- pulse 2+, symmetric, no pedal edema. SKIN- Hundreds of skin lesions including actinic keratoses,  PSYCH- Somewhat flat affect appropriate thought content and speech.    Assessment & Plan:   See encounters tab for problem based medical decision making.   Patient discussed with Dr. Beryle Beams

## 2017-04-28 NOTE — Patient Instructions (Signed)
It was a pleasure to see you today Zachary [name].  I am referring you to have a study to look at your swallowing. I am concerned you could be coughing from not swallowing well so we should check this.  I asked the facility to schedule your eyedrops and adjust your constipation medicine.  We will need to see you back within a month or so for you annual checkup visit.

## 2017-04-29 LAB — BMP8+ANION GAP
Anion Gap: 17 mmol/L (ref 10.0–18.0)
BUN / CREAT RATIO: 15 (ref 10–24)
BUN: 31 mg/dL — AB (ref 8–27)
CHLORIDE: 96 mmol/L (ref 96–106)
CO2: 21 mmol/L (ref 20–29)
Calcium: 8.9 mg/dL (ref 8.6–10.2)
Creatinine, Ser: 2.02 mg/dL — ABNORMAL HIGH (ref 0.76–1.27)
GFR calc non Af Amer: 29 mL/min/{1.73_m2} — ABNORMAL LOW (ref >59)
GFR, EST AFRICAN AMERICAN: 34 mL/min/{1.73_m2} — AB (ref >59)
GLUCOSE: 111 mg/dL — AB (ref 65–99)
Potassium: 5.3 mmol/L — ABNORMAL HIGH (ref 3.5–5.2)
Sodium: 134 mmol/L (ref 134–144)

## 2017-04-29 LAB — CBC
HEMATOCRIT: 38.4 % (ref 37.5–51.0)
Hemoglobin: 12.9 g/dL — ABNORMAL LOW (ref 13.0–17.7)
MCH: 32.5 pg (ref 26.6–33.0)
MCHC: 33.6 g/dL (ref 31.5–35.7)
MCV: 97 fL (ref 79–97)
Platelets: 168 10*3/uL (ref 150–379)
RBC: 3.97 x10E6/uL — ABNORMAL LOW (ref 4.14–5.80)
RDW: 13.6 % (ref 12.3–15.4)
WBC: 7.1 10*3/uL (ref 3.4–10.8)

## 2017-05-01 DIAGNOSIS — H04123 Dry eye syndrome of bilateral lacrimal glands: Secondary | ICD-10-CM | POA: Insufficient documentation

## 2017-05-01 NOTE — Assessment & Plan Note (Signed)
He appears to be in stable condition. He has not required any PRN benzodiazepines for months and is doing well on scheduled doses. I will discontinue PRN medication in the [facility] as he is not needing it, this woiuld have potential for more respiratory complications, and we can reassess his anxiety if needed.

## 2017-05-01 NOTE — Progress Notes (Signed)
Medicine attending: Medical history, presenting problems, physical findings, and medications, reviewed with resident physician Dr Vernelle Emerald on the day of the patient visit and I concur with his evaluation and management plan.

## 2017-05-01 NOTE — Assessment & Plan Note (Signed)
He was having more than daily stools on 4 tablets daily so this dose was halved and allowed PRN miralax. He is now regularly having constipation so we will increase sennosides to 3 tablets qHS.

## 2017-05-01 NOTE — Assessment & Plan Note (Signed)
He is having very frequent dryness and irritation of the left eye which does not approximate well after medial edge required skin tumor excision. He is requesting the eyedrops as often as PRN prescribing and frustrated at the delays in getting them at [facility].  I will change this prescription to scheduled Recommended he follow up with his opthalmologist for repeat evaluation

## 2017-05-01 NOTE — Assessment & Plan Note (Addendum)
Zachary [name] is on chronic opioid therapy for chronic pain. The date of the controlled substances contract is referenced in the [place] and / or the overview. Date of pain contract was 11/04/2010. His medications are obtained and administered per [facility]. He has not received any PRN tramadol within the past week per [facility].  The last UDS was on 11/11/2016 and results shows a unexpected due to not taking his PRN tramadol. Patient needs at least a yearly UDS.   The patient is on tramadol (Ultram) and methadone strength 5mg  q8hrs , 90 per 30 days. Adjunctive treatment includes NSAID's. This regimen allows Zachary [name] to function and does not cause excessive sedation or other side effects.  He does have some degree of constipation which was well controlled on 4 tablets of sennosides daily but has been more symptomatic after halving this dose.  "The benefits of continuing opioid therapy outweigh the risks and chronic opioids will be continued. Ongoing education about safe opioid treatment is provided  Interventions today include: UDS and Refills - 3 paper Rx printed

## 2017-05-01 NOTE — Assessment & Plan Note (Addendum)
He already received a flu vaccine on 04/05/2017

## 2017-05-01 NOTE — Assessment & Plan Note (Signed)
He has been coughing more for the past 3 months. This is prominently after eating meals although he denies feeling that he is choking. His trouble with swallowing and coughing is enough that he has stopped eating lunch regularly and just drinks ensure more of the day. He denies shortness of breath although the coughing is sometimes enough to cause chest pain. A: Cough associated with dysphagia is very concerning for aspiration or near aspiration events. He has a significant history of GERD with inflammation so associated changes such as stricture as a possibility. However given the history of pretty immediate coughing without any regurgitation I am more concerned for upper motility problems. P: Referral for MBBS

## 2017-05-01 NOTE — Assessment & Plan Note (Signed)
Previously stable CKD3. We will recheck metabolic panel today given his reduced intake and medications.

## 2017-05-03 ENCOUNTER — Telehealth: Payer: Self-pay

## 2017-05-03 ENCOUNTER — Other Ambulatory Visit: Payer: Self-pay | Admitting: *Deleted

## 2017-05-03 DIAGNOSIS — F419 Anxiety disorder, unspecified: Secondary | ICD-10-CM

## 2017-05-03 NOTE — Telephone Encounter (Signed)
LORazepam (ATIVAN) 0.5 MG tablet, Refill request @ [facility].

## 2017-05-04 MED ORDER — LORAZEPAM 0.5 MG PO TABS: 0.5000 mg | ORAL_TABLET | Freq: Two times a day (BID) | ORAL | 5 refills | Status: DC

## 2017-05-04 NOTE — Telephone Encounter (Signed)
Faxed script

## 2017-05-05 LAB — TOXASSURE SELECT,+ANTIDEPR,UR

## 2017-05-05 NOTE — Telephone Encounter (Signed)
Faxed to pharm 

## 2017-05-10 ENCOUNTER — Other Ambulatory Visit (HOSPITAL_COMMUNITY): Payer: Self-pay | Admitting: Oncology

## 2017-05-10 DIAGNOSIS — R1310 Dysphagia, unspecified: Secondary | ICD-10-CM

## 2017-05-18 ENCOUNTER — Other Ambulatory Visit: Payer: Self-pay | Admitting: *Deleted

## 2017-05-18 DIAGNOSIS — G894 Chronic pain syndrome: Secondary | ICD-10-CM

## 2017-05-19 MED ORDER — TRAMADOL HCL 50 MG PO TABS: 50.0000 mg | ORAL_TABLET | Freq: Two times a day (BID) | ORAL | 5 refills | Status: DC | PRN

## 2017-05-19 NOTE — Telephone Encounter (Signed)
Tramadol verified with Pharmacist ([place]).  escribed or phone in for tramadol are accepted.

## 2017-05-22 ENCOUNTER — Other Ambulatory Visit: Payer: Self-pay | Admitting: *Deleted

## 2017-05-22 DIAGNOSIS — G894 Chronic pain syndrome: Secondary | ICD-10-CM

## 2017-05-22 MED ORDER — METHADONE HCL 5 MG PO TABS: 5.0000 mg | ORAL_TABLET | Freq: Three times a day (TID) | ORAL | 0 refills | Status: DC

## 2017-05-22 NOTE — Telephone Encounter (Signed)
Refill ordered electronically.  His swallow study is 12/5 so I will probably need to contact him and arrange his next appointment after that, and can add additional refills if needed for the interval.

## 2017-05-24 ENCOUNTER — Inpatient Hospital Stay (HOSPITAL_COMMUNITY): Admission: RE | Admit: 2017-05-24 | Payer: Medicare Other | Source: Ambulatory Visit

## 2017-05-24 ENCOUNTER — Ambulatory Visit (HOSPITAL_COMMUNITY): Payer: Medicare Other

## 2017-05-24 NOTE — Telephone Encounter (Signed)
Error

## 2017-06-15 ENCOUNTER — Other Ambulatory Visit: Payer: Self-pay | Admitting: *Deleted

## 2017-06-15 DIAGNOSIS — G894 Chronic pain syndrome: Secondary | ICD-10-CM

## 2017-06-21 MED ORDER — METHADONE HCL 5 MG PO TABS: 5.0000 mg | ORAL_TABLET | Freq: Three times a day (TID) | ORAL | 0 refills | Status: DC

## 2017-07-18 ENCOUNTER — Other Ambulatory Visit: Payer: Self-pay | Admitting: *Deleted

## 2017-07-18 DIAGNOSIS — G894 Chronic pain syndrome: Secondary | ICD-10-CM

## 2017-07-18 NOTE — Telephone Encounter (Signed)
Last office visit: 04/28/17 Last Written: 06/21/17 Next appt: none scheduled

## 2017-07-19 MED ORDER — METHADONE HCL 5 MG PO TABS: 5.0000 mg | ORAL_TABLET | Freq: Three times a day (TID) | ORAL | 0 refills | Status: DC

## 2017-08-15 ENCOUNTER — Other Ambulatory Visit: Payer: Self-pay | Admitting: Internal Medicine

## 2017-08-15 DIAGNOSIS — G894 Chronic pain syndrome: Secondary | ICD-10-CM

## 2017-08-18 MED ORDER — METHADONE HCL 5 MG PO TABS: 5.0000 mg | ORAL_TABLET | Freq: Three times a day (TID) | ORAL | 0 refills | Status: DC

## 2017-08-18 NOTE — Telephone Encounter (Signed)
It appears [name] was last seen in November. Would be be able to come to a continuity clinic visit at the end of this month? Otherwise I would be free in May.  I will reorder this for 30 days at this time and I can extend that to whenever he is able to schedule follow up.

## 2017-09-15 ENCOUNTER — Encounter: Payer: Self-pay | Admitting: Internal Medicine

## 2017-09-15 ENCOUNTER — Other Ambulatory Visit: Payer: Self-pay

## 2017-09-15 ENCOUNTER — Ambulatory Visit (INDEPENDENT_AMBULATORY_CARE_PROVIDER_SITE_OTHER): Payer: Medicare Other | Admitting: Internal Medicine

## 2017-09-15 VITALS — BP 187/75 | HR 71 | Temp 98.1°F | Ht 67.0 in | Wt 206.2 lb

## 2017-09-15 DIAGNOSIS — Z79891 Long term (current) use of opiate analgesic: Secondary | ICD-10-CM

## 2017-09-15 DIAGNOSIS — R51 Headache: Secondary | ICD-10-CM | POA: Diagnosis not present

## 2017-09-15 DIAGNOSIS — R42 Dizziness and giddiness: Secondary | ICD-10-CM

## 2017-09-15 DIAGNOSIS — H04123 Dry eye syndrome of bilateral lacrimal glands: Secondary | ICD-10-CM

## 2017-09-15 DIAGNOSIS — Z87891 Personal history of nicotine dependence: Secondary | ICD-10-CM

## 2017-09-15 DIAGNOSIS — L598 Other specified disorders of the skin and subcutaneous tissue related to radiation: Secondary | ICD-10-CM | POA: Diagnosis not present

## 2017-09-15 DIAGNOSIS — H04122 Dry eye syndrome of left lacrimal gland: Secondary | ICD-10-CM | POA: Diagnosis not present

## 2017-09-15 DIAGNOSIS — I10 Essential (primary) hypertension: Secondary | ICD-10-CM | POA: Diagnosis not present

## 2017-09-15 DIAGNOSIS — Z85828 Personal history of other malignant neoplasm of skin: Secondary | ICD-10-CM | POA: Diagnosis not present

## 2017-09-15 DIAGNOSIS — L570 Actinic keratosis: Secondary | ICD-10-CM

## 2017-09-15 NOTE — Patient Instructions (Signed)
Decrease tramadol to at most 1 time daily Tylenol is fine at low dose for HA Needs ophthalmology F/U for photosensitinity, strain HA in post-op eye Checking labs for overdiuresis / orthostasis

## 2017-09-15 NOTE — Progress Notes (Signed)
CC: Dizziness  HPI:  Zachary [name] is a 84 y.o. male with PMHx detailed below presenting for a routine follow up visit with news complaints of headaches and episodic dizziness. He is feeling dizzy sometimes while walking but most often when first getting up from seated. He does not see the room spinning but feels lightheaded. He reports losing his balance once when stepping out of the shower but caught himself easily with handholds in the bathroom. He has headaches almost daily that get worse with bright lights and centered in the forehead bilaterally. He is having much worse photosensitivity in his left eye despite increased use of lubricating tears. 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 He has been taking PRN tramadol at a much increased frequency on account of these headaches.  See problem based assessment and plan below for additional details.  Dizziness He has increase of intermittent dizziness with multiple possible contributions. There is no obvious vertigo on examination or by history. He is mildly positive for orthostatics today with SBP decrease from 165 to 140. He is also taking increased opioid pain medications on top of low dose benzodiazepines and chronic methadone. We will check Bmet today and make sure no significant electrolyte disturbance suggesting overdiuresis. Recommend PRN use of tylenol before additional opioids Decrease PRN tramadol to once daily maximum  Dry eyes He is still having symptoms of eye irritation, strain, and photosensitivity despite the frequent use of artificial tears. I believe this is contributing to his frontal headaches. He is an established patient with [facility] center so I recommended he contact them directly to schedule an appointment. This recommendation is provided to [place] facility as well to help coordinate transportation.  Essential  hypertension He has elevated blood pressure on exam today but is 140 SBP on repeat seated exam while checking orthostatics. He has some episodes of dizziness during positional changes so I would not adjust to tight blood pressure control. His volume status appears euvolemic today. Checking Bmet today.   Past Medical History:  Diagnosis Date  . Alcoholism ([place])    Remote  . Anxiety 06/22/2006  . Aortic atherosclerosis ([place]) 02/26/2015   Seen on CT scan, currently asymptomatic  . Arthritis of hip 05/21/2010   s/p left hip total arthroplasty   . Benign prostatic hypertrophy with urinary obstruction 06/22/2006  . Cataract   . Chronic kidney disease, stage 3 ([place]) 11/08/2013  . Chronic pain disorder 06/22/2006  . Constipation due to pain medication 06/03/2011  . Diverticulosis of colon 06/22/2006  . Essential hypertension 06/22/2006  . Facial basal cell cancer 06/22/2006  . Gastritis 07/06/2012   Seen on EGD 08/02/2006   . Gastroesophageal reflux disease with stricture 06/22/2006   With esophagitis, requiring dilatation 08/02/2006   . Irritable bowel syndrome   . Major depression 06/22/2006  . Obesity (BMI 30.0-34.9) 07/06/2012  . Osteoarthritis of left knee 03/07/2013  . Squamous cell skin cancer 07/06/2012   In situ   . Swelling of left lower extremity 12/07/2012   Responds to lasix 40 mg daily       Review of Systems: Review of Systems  Constitutional: Negative for malaise/fatigue and weight loss.  HENT: Negative for tinnitus.   Eyes: Positive for blurred vision, photophobia, pain and redness. Negative for discharge.  Respiratory: Negative for cough.   Cardiovascular: Negative for chest pain and leg swelling.  Gastrointestinal: Negative  for abdominal pain.  Musculoskeletal: Negative for falls.  Skin: Negative for rash.  Neurological: Positive for dizziness and headaches. Negative for sensory change and focal weakness.     Physical Exam: Vitals:   09/15/17 1435 09/15/17 1458  BP: (!) 174/76 (!)  187/75  Pulse: 74 71  Temp: 98.1 F (36.7 C)   TempSrc: Oral   SpO2: 97%   Weight: 206 lb 3.2 oz (93.5 kg)   Height: 5\' 7"  (1.702 m)    GENERAL- alert, co-operative, NAD HEENT- Atraumatic, oral mucosa appears moist, left eye is photosensitive and conjunctiva slight injected. CARDIAC- RRR, no murmurs, rubs or gallops. RESP- CTAB, no wheezes or crackles. ABDOMEN- Soft, nontender, no guarding or rebound NEURO- No obvious Cr N abnormality, strength upper and lower extremities- 5/5, Sensation intact globally, achilles and patellar reflexes normal symmetrically EXTREMITIES- symmetric, no pedal edema. SKIN- Warm, dry, numerous actinic keratoses diffusely scattered PSYCH- Normal mood and affect, appropriate thought content and speech.   Assessment & Plan:   See encounters tab for problem based medical decision making.   Patient discussed with Dr. Beryle Beams

## 2017-09-16 LAB — BMP8+ANION GAP
Anion Gap: 13 mmol/L (ref 10.0–18.0)
BUN/Creatinine Ratio: 18 (ref 10–24)
BUN: 36 mg/dL — ABNORMAL HIGH (ref 8–27)
CALCIUM: 8.8 mg/dL (ref 8.6–10.2)
CO2: 24 mmol/L (ref 20–29)
Chloride: 99 mmol/L (ref 96–106)
Creatinine, Ser: 1.99 mg/dL — ABNORMAL HIGH (ref 0.76–1.27)
GFR calc Af Amer: 34 mL/min/{1.73_m2} — ABNORMAL LOW (ref >59)
GFR, EST NON AFRICAN AMERICAN: 30 mL/min/{1.73_m2} — AB (ref >59)
Glucose: 140 mg/dL — ABNORMAL HIGH (ref 65–99)
Potassium: 4.5 mmol/L (ref 3.5–5.2)
Sodium: 136 mmol/L (ref 134–144)

## 2017-09-18 ENCOUNTER — Other Ambulatory Visit: Payer: Self-pay | Admitting: *Deleted

## 2017-09-18 DIAGNOSIS — G894 Chronic pain syndrome: Secondary | ICD-10-CM

## 2017-09-19 ENCOUNTER — Other Ambulatory Visit: Payer: Self-pay | Admitting: *Deleted

## 2017-09-19 DIAGNOSIS — G894 Chronic pain syndrome: Secondary | ICD-10-CM

## 2017-09-19 MED ORDER — METHADONE HCL 5 MG PO TABS: 5.0000 mg | ORAL_TABLET | Freq: Three times a day (TID) | ORAL | 0 refills | Status: DC

## 2017-09-19 NOTE — Assessment & Plan Note (Addendum)
He is still having symptoms of eye irritation, strain, and photosensitivity despite the frequent use of artificial tears. I believe this is contributing to his frontal headaches. He is an established patient with [facility] center so I recommended he contact them directly to schedule an appointment. This recommendation is provided to [place] facility as well to help coordinate transportation.

## 2017-09-19 NOTE — Telephone Encounter (Signed)
Zachary [name] from [place] requesting refill on methadone be sent by fax to [facility] at [phone] as they have been having difficulties when it is e-scribed. Zachary Hartshorn, RN, BSN

## 2017-09-19 NOTE — Assessment & Plan Note (Signed)
He has elevated blood pressure on exam today but is 140 SBP on repeat seated exam while checking orthostatics. He has some episodes of dizziness during positional changes so I would not adjust to tight blood pressure control. His volume status appears euvolemic today. Checking Bmet today.

## 2017-09-19 NOTE — Assessment & Plan Note (Signed)
He has increase of intermittent dizziness with multiple possible contributions. There is no obvious vertigo on examination or by history. He is mildly positive for orthostatics today with SBP decrease from 165 to 140. He is also taking increased opioid pain medications on top of low dose benzodiazepines and chronic methadone. We will check Bmet today and make sure no significant electrolyte disturbance suggesting overdiuresis. Recommend PRN use of tylenol before additional opioids Decrease PRN tramadol to once daily maximum

## 2017-09-20 ENCOUNTER — Encounter: Payer: Self-pay | Admitting: *Deleted

## 2017-09-20 MED ORDER — METHADONE HCL 5 MG PO TABS: 5.0000 mg | ORAL_TABLET | Freq: Three times a day (TID) | ORAL | 0 refills | Status: DC

## 2017-09-20 MED ORDER — TRAMADOL HCL 50 MG PO TABS: 50.0000 mg | ORAL_TABLET | Freq: Every day | ORAL | Status: DC | PRN

## 2017-09-20 NOTE — Telephone Encounter (Signed)
Printed Rx x3 signed in overnight box available for faxing to pharmacy.

## 2017-09-20 NOTE — Addendum Note (Signed)
Addended by: Collier Salina on: 09/20/2017 06:18 AM   Modules accepted: Orders

## 2017-09-20 NOTE — Progress Notes (Signed)
Medicine attending: Medical history, presenting problems, physical findings, and medications, reviewed with resident physician Dr Vernelle Emerald on the day of the patient visit and I concur with his evaluation and management plan.

## 2017-09-21 MED ORDER — TRAMADOL HCL 50 MG PO TABS: 50.0000 mg | ORAL_TABLET | Freq: Every day | ORAL | 3 refills | Status: DC | PRN

## 2017-09-21 NOTE — Addendum Note (Signed)
Addended by: Collier Salina on: 09/21/2017 03:39 PM   Modules accepted: Orders

## 2017-11-08 ENCOUNTER — Telehealth: Payer: Self-pay | Admitting: *Deleted

## 2017-11-08 NOTE — Telephone Encounter (Signed)
Fax from [facility] - requesting: 1) pt has voiced complaints of the frequency of eye drops. He would like to change eye drops to 3 times a day instead of every 4 hrs. 2) They would like to discontinue these meds d/t non-use;  Mucinex, Simethicone cap and voltaren gel. 3) And they would like to have orders to allow pt to apply and take off compression stockings - pt is independent and can do this himself. Thanks

## 2017-11-09 NOTE — Telephone Encounter (Signed)
I agree with these orders. Please fax the form indicating such back when able. Thanks.

## 2017-11-10 NOTE — Telephone Encounter (Signed)
Form faxed back to [facility] @ [phone]. Form to be scanned.

## 2017-11-22 ENCOUNTER — Telehealth: Payer: Self-pay | Admitting: *Deleted

## 2017-11-22 NOTE — Telephone Encounter (Signed)
Received fax from [facility] stating patient had fall near A/C unit in his room without injury. BP 170/79 T 96.8 P 78 Attempted to speak to [name] who sent fax, however, was told she works third shift. [name] will have another tech call back when available. [name], RN, BSN

## 2017-12-01 ENCOUNTER — Telehealth: Payer: Self-pay

## 2017-12-01 NOTE — Telephone Encounter (Signed)
Zachary [name] with [name] on [address] requesting to speak with a nurse about Tramadol. Please call back.

## 2017-12-01 NOTE — Telephone Encounter (Signed)
rtc to andrea, she states she needs an order sheet with tramadol order, will print medlist item out and sign then fax

## 2017-12-16 ENCOUNTER — Encounter (HOSPITAL_COMMUNITY): Payer: Self-pay | Admitting: Emergency Medicine

## 2017-12-16 ENCOUNTER — Emergency Department (HOSPITAL_COMMUNITY): Payer: Medicare Other

## 2017-12-16 ENCOUNTER — Other Ambulatory Visit: Payer: Self-pay

## 2017-12-16 ENCOUNTER — Inpatient Hospital Stay (HOSPITAL_COMMUNITY)
Admission: EM | Admit: 2017-12-16 | Discharge: 2017-12-20 | DRG: 149 | Disposition: A | Discharge status: Skilled Nursing Facility | Payer: Medicare Other | Attending: Internal Medicine | Admitting: Internal Medicine

## 2017-12-16 DIAGNOSIS — E875 Hyperkalemia: Secondary | ICD-10-CM | POA: Diagnosis present

## 2017-12-16 DIAGNOSIS — R112 Nausea with vomiting, unspecified: Secondary | ICD-10-CM

## 2017-12-16 DIAGNOSIS — H5712 Ocular pain, left eye: Secondary | ICD-10-CM | POA: Diagnosis present

## 2017-12-16 DIAGNOSIS — S0191XA Laceration without foreign body of unspecified part of head, initial encounter: Secondary | ICD-10-CM | POA: Diagnosis present

## 2017-12-16 DIAGNOSIS — Z881 Allergy status to other antibiotic agents status: Secondary | ICD-10-CM

## 2017-12-16 DIAGNOSIS — M25552 Pain in left hip: Secondary | ICD-10-CM | POA: Diagnosis present

## 2017-12-16 DIAGNOSIS — G8929 Other chronic pain: Secondary | ICD-10-CM | POA: Diagnosis present

## 2017-12-16 DIAGNOSIS — I951 Orthostatic hypotension: Secondary | ICD-10-CM | POA: Diagnosis present

## 2017-12-16 DIAGNOSIS — Z7982 Long term (current) use of aspirin: Secondary | ICD-10-CM

## 2017-12-16 DIAGNOSIS — Z85828 Personal history of other malignant neoplasm of skin: Secondary | ICD-10-CM

## 2017-12-16 DIAGNOSIS — G893 Neoplasm related pain (acute) (chronic): Secondary | ICD-10-CM | POA: Diagnosis not present

## 2017-12-16 DIAGNOSIS — I6522 Occlusion and stenosis of left carotid artery: Secondary | ICD-10-CM | POA: Diagnosis present

## 2017-12-16 DIAGNOSIS — R42 Dizziness and giddiness: Secondary | ICD-10-CM | POA: Diagnosis present

## 2017-12-16 DIAGNOSIS — Z6832 Body mass index (BMI) 32.0-32.9, adult: Secondary | ICD-10-CM

## 2017-12-16 DIAGNOSIS — R1013 Epigastric pain: Secondary | ICD-10-CM

## 2017-12-16 DIAGNOSIS — K219 Gastro-esophageal reflux disease without esophagitis: Secondary | ICD-10-CM | POA: Diagnosis present

## 2017-12-16 DIAGNOSIS — E785 Hyperlipidemia, unspecified: Secondary | ICD-10-CM

## 2017-12-16 DIAGNOSIS — R29701 NIHSS score 1: Secondary | ICD-10-CM | POA: Diagnosis present

## 2017-12-16 DIAGNOSIS — E669 Obesity, unspecified: Secondary | ICD-10-CM | POA: Diagnosis present

## 2017-12-16 DIAGNOSIS — I6330 Cerebral infarction due to thrombosis of unspecified cerebral artery: Secondary | ICD-10-CM

## 2017-12-16 DIAGNOSIS — Z791 Long term (current) use of non-steroidal anti-inflammatories (NSAID): Secondary | ICD-10-CM

## 2017-12-16 DIAGNOSIS — N183 Chronic kidney disease, stage 3 (moderate): Secondary | ICD-10-CM | POA: Diagnosis present

## 2017-12-16 DIAGNOSIS — Z87891 Personal history of nicotine dependence: Secondary | ICD-10-CM | POA: Diagnosis not present

## 2017-12-16 DIAGNOSIS — W19XXXA Unspecified fall, initial encounter: Secondary | ICD-10-CM

## 2017-12-16 DIAGNOSIS — K589 Irritable bowel syndrome without diarrhea: Secondary | ICD-10-CM | POA: Diagnosis present

## 2017-12-16 DIAGNOSIS — Z9181 History of falling: Secondary | ICD-10-CM

## 2017-12-16 DIAGNOSIS — F329 Major depressive disorder, single episode, unspecified: Secondary | ICD-10-CM | POA: Diagnosis present

## 2017-12-16 DIAGNOSIS — I129 Hypertensive chronic kidney disease with stage 1 through stage 4 chronic kidney disease, or unspecified chronic kidney disease: Secondary | ICD-10-CM | POA: Diagnosis present

## 2017-12-16 DIAGNOSIS — I6350 Cerebral infarction due to unspecified occlusion or stenosis of unspecified cerebral artery: Secondary | ICD-10-CM

## 2017-12-16 DIAGNOSIS — R109 Unspecified abdominal pain: Secondary | ICD-10-CM | POA: Diagnosis not present

## 2017-12-16 DIAGNOSIS — I10 Essential (primary) hypertension: Secondary | ICD-10-CM | POA: Diagnosis not present

## 2017-12-16 DIAGNOSIS — Z96642 Presence of left artificial hip joint: Secondary | ICD-10-CM | POA: Diagnosis not present

## 2017-12-16 DIAGNOSIS — F0390 Unspecified dementia without behavioral disturbance: Secondary | ICD-10-CM | POA: Diagnosis present

## 2017-12-16 DIAGNOSIS — N401 Enlarged prostate with lower urinary tract symptoms: Secondary | ICD-10-CM | POA: Diagnosis present

## 2017-12-16 DIAGNOSIS — G8194 Hemiplegia, unspecified affecting left nondominant side: Secondary | ICD-10-CM | POA: Diagnosis present

## 2017-12-16 DIAGNOSIS — Z79899 Other long term (current) drug therapy: Secondary | ICD-10-CM

## 2017-12-16 DIAGNOSIS — R296 Repeated falls: Secondary | ICD-10-CM | POA: Diagnosis present

## 2017-12-16 DIAGNOSIS — H532 Diplopia: Secondary | ICD-10-CM | POA: Diagnosis present

## 2017-12-16 DIAGNOSIS — H5500 Unspecified nystagmus: Secondary | ICD-10-CM | POA: Diagnosis present

## 2017-12-16 DIAGNOSIS — Z8673 Personal history of transient ischemic attack (TIA), and cerebral infarction without residual deficits: Secondary | ICD-10-CM

## 2017-12-16 DIAGNOSIS — N400 Enlarged prostate without lower urinary tract symptoms: Secondary | ICD-10-CM | POA: Diagnosis not present

## 2017-12-16 DIAGNOSIS — C4490 Unspecified malignant neoplasm of skin, unspecified: Secondary | ICD-10-CM | POA: Diagnosis not present

## 2017-12-16 DIAGNOSIS — F419 Anxiety disorder, unspecified: Secondary | ICD-10-CM

## 2017-12-16 HISTORY — DX: Hyperlipidemia, unspecified: E78.5

## 2017-12-16 HISTORY — DX: Cerebral infarction due to thrombosis of unspecified cerebral artery: I63.30

## 2017-12-16 LAB — CBC WITH DIFFERENTIAL/PLATELET
Abs Immature Granulocytes: 0 10*3/uL (ref 0.0–0.1)
Basophils Absolute: 0 10*3/uL (ref 0.0–0.1)
Basophils Relative: 0 %
EOS PCT: 2 %
Eosinophils Absolute: 0.1 10*3/uL (ref 0.0–0.7)
HEMATOCRIT: 41.2 % (ref 39.0–52.0)
HEMOGLOBIN: 13.5 g/dL (ref 13.0–17.0)
IMMATURE GRANULOCYTES: 0 %
Lymphocytes Relative: 21 %
Lymphs Abs: 1.2 10*3/uL (ref 0.7–4.0)
MCH: 32.1 pg (ref 26.0–34.0)
MCHC: 32.8 g/dL (ref 30.0–36.0)
MCV: 98.1 fL (ref 78.0–100.0)
MONOS PCT: 6 %
Monocytes Absolute: 0.3 10*3/uL (ref 0.1–1.0)
NEUTROS PCT: 71 %
Neutro Abs: 3.9 10*3/uL (ref 1.7–7.7)
Platelets: 169 10*3/uL (ref 150–400)
RBC: 4.2 MIL/uL — AB (ref 4.22–5.81)
RDW: 12.2 % (ref 11.5–15.5)
WBC: 5.5 10*3/uL (ref 4.0–10.5)

## 2017-12-16 LAB — COMPREHENSIVE METABOLIC PANEL
ALBUMIN: 3.4 g/dL — AB (ref 3.5–5.0)
ALK PHOS: 177 U/L — AB (ref 38–126)
ALT: 18 U/L (ref 0–44)
AST: 31 U/L (ref 15–41)
Anion gap: 8 (ref 5–15)
BILIRUBIN TOTAL: 0.7 mg/dL (ref 0.3–1.2)
BUN: 27 mg/dL — ABNORMAL HIGH (ref 8–23)
CALCIUM: 8.6 mg/dL — AB (ref 8.9–10.3)
CO2: 26 mmol/L (ref 22–32)
CREATININE: 1.9 mg/dL — AB (ref 0.61–1.24)
Chloride: 103 mmol/L (ref 98–111)
GFR calc Af Amer: 35 mL/min — ABNORMAL LOW (ref >60)
GFR calc non Af Amer: 31 mL/min — ABNORMAL LOW (ref >60)
GLUCOSE: 118 mg/dL — AB (ref 70–99)
POTASSIUM: 5.3 mmol/L — AB (ref 3.5–5.1)
Sodium: 137 mmol/L (ref 135–145)
TOTAL PROTEIN: 6.5 g/dL (ref 6.5–8.1)

## 2017-12-16 LAB — HEMOGLOBIN A1C
HEMOGLOBIN A1C: 5.7 % — AB (ref 4.8–5.6)
MEAN PLASMA GLUCOSE: 116.89 mg/dL

## 2017-12-16 LAB — URINALYSIS, ROUTINE W REFLEX MICROSCOPIC
Bilirubin Urine: NEGATIVE
Glucose, UA: NEGATIVE mg/dL
Hgb urine dipstick: NEGATIVE
Ketones, ur: NEGATIVE mg/dL
Leukocytes, UA: NEGATIVE
Nitrite: NEGATIVE
Protein, ur: NEGATIVE mg/dL
SPECIFIC GRAVITY, URINE: 1.01 (ref 1.005–1.030)
pH: 8 (ref 5.0–8.0)

## 2017-12-16 LAB — RAPID URINE DRUG SCREEN, HOSP PERFORMED
Amphetamines: NOT DETECTED
BENZODIAZEPINES: NOT DETECTED
Cocaine: NOT DETECTED
Opiates: NOT DETECTED
Tetrahydrocannabinol: NOT DETECTED

## 2017-12-16 LAB — LIPID PANEL
Cholesterol: 201 mg/dL — ABNORMAL HIGH (ref 0–200)
HDL: 31 mg/dL — ABNORMAL LOW (ref >40)
LDL CALC: 122 mg/dL — AB (ref 0–99)
Total CHOL/HDL Ratio: 6.5 RATIO
Triglycerides: 240 mg/dL — ABNORMAL HIGH (ref <150)
VLDL: 48 mg/dL — ABNORMAL HIGH (ref 0–40)

## 2017-12-16 LAB — I-STAT CG4 LACTIC ACID, ED: Lactic Acid, Venous: 1.38 mmol/L (ref 0.5–1.9)

## 2017-12-16 LAB — LIPASE, BLOOD: Lipase: 43 U/L (ref 11–51)

## 2017-12-16 LAB — PROTIME-INR
INR: 1.01
Prothrombin Time: 13.2 seconds (ref 11.4–15.2)

## 2017-12-16 MED ORDER — IOPAMIDOL (ISOVUE-370) INJECTION 76%
80.0000 mL | Freq: Once | INTRAVENOUS | Status: AC | PRN
  Administered 2017-12-16: 100 mL via INTRAVENOUS

## 2017-12-16 MED ORDER — LORAZEPAM 2 MG/ML IJ SOLN
1.0000 mg | INTRAMUSCULAR | Status: AC
  Administered 2017-12-16: 1 mg via INTRAVENOUS
  Filled 2017-12-16: qty 1

## 2017-12-16 MED ORDER — POLYETHYLENE GLYCOL 3350 17 G PO PACK
17.0000 g | PACK | Freq: Every day | ORAL | Status: DC
  Administered 2017-12-17 – 2017-12-19 (×3): 17 g via ORAL
  Filled 2017-12-16 (×3): qty 1

## 2017-12-16 MED ORDER — SODIUM CHLORIDE 0.9 % IV SOLN
Freq: Once | INTRAVENOUS | Status: AC
  Administered 2017-12-16: 16:00:00 via INTRAVENOUS

## 2017-12-16 MED ORDER — PANTOPRAZOLE SODIUM 40 MG PO TBEC
40.0000 mg | DELAYED_RELEASE_TABLET | Freq: Every day | ORAL | Status: DC
  Administered 2017-12-17 – 2017-12-19 (×3): 40 mg via ORAL
  Filled 2017-12-16 (×3): qty 1

## 2017-12-16 MED ORDER — PROCHLORPERAZINE EDISYLATE 10 MG/2ML IJ SOLN
10.0000 mg | Freq: Once | INTRAMUSCULAR | Status: AC
  Administered 2017-12-17: 10 mg via INTRAVENOUS
  Filled 2017-12-16: qty 2

## 2017-12-16 MED ORDER — ENOXAPARIN SODIUM 30 MG/0.3ML ~~LOC~~ SOLN
30.0000 mg | SUBCUTANEOUS | Status: DC
  Administered 2017-12-17 – 2017-12-18 (×2): 30 mg via SUBCUTANEOUS
  Filled 2017-12-16 (×2): qty 0.3

## 2017-12-16 MED ORDER — LORAZEPAM 2 MG/ML IJ SOLN
0.5000 mg | Freq: Three times a day (TID) | INTRAMUSCULAR | Status: DC
  Administered 2017-12-16 – 2017-12-18 (×7): 0.5 mg via INTRAVENOUS
  Administered 2017-12-19: 1 mg via INTRAVENOUS
  Administered 2017-12-19: 0.5 mg via INTRAVENOUS
  Filled 2017-12-16 (×10): qty 1

## 2017-12-16 MED ORDER — FLUTICASONE PROPIONATE 50 MCG/ACT NA SUSP
1.0000 | Freq: Every day | NASAL | Status: DC
  Administered 2017-12-17 – 2017-12-19 (×2): 1 via NASAL
  Filled 2017-12-16: qty 16

## 2017-12-16 MED ORDER — ASPIRIN EC 325 MG PO TBEC
325.0000 mg | DELAYED_RELEASE_TABLET | Freq: Once | ORAL | Status: AC
  Administered 2017-12-16: 325 mg via ORAL
  Filled 2017-12-16: qty 1

## 2017-12-16 MED ORDER — ACETAMINOPHEN 500 MG PO TABS
1000.0000 mg | ORAL_TABLET | Freq: Once | ORAL | Status: AC
Start: 2017-12-16 — End: 2017-12-16
  Administered 2017-12-16: 1000 mg via ORAL
  Filled 2017-12-16: qty 2

## 2017-12-16 MED ORDER — IOPAMIDOL (ISOVUE-370) INJECTION 76%
INTRAVENOUS | Status: AC
  Filled 2017-12-16: qty 100

## 2017-12-16 MED ORDER — ASPIRIN EC 81 MG PO TBEC: 81.0000 mg | DELAYED_RELEASE_TABLET | Freq: Every day | ORAL | Status: DC

## 2017-12-16 MED ORDER — ROSUVASTATIN CALCIUM 20 MG PO TABS
20.0000 mg | ORAL_TABLET | Freq: Every day | ORAL | Status: DC
  Administered 2017-12-17 – 2017-12-19 (×3): 20 mg via ORAL
  Filled 2017-12-16 (×3): qty 1

## 2017-12-16 MED ORDER — MIRTAZAPINE 15 MG PO TABS
15.0000 mg | ORAL_TABLET | Freq: Every day | ORAL | Status: DC
  Administered 2017-12-16 – 2017-12-18 (×3): 15 mg via ORAL
  Filled 2017-12-16 (×3): qty 1

## 2017-12-16 MED ORDER — SERTRALINE HCL 50 MG PO TABS
150.0000 mg | ORAL_TABLET | Freq: Every day | ORAL | Status: DC
  Administered 2017-12-17 – 2017-12-18 (×2): 150 mg via ORAL
  Administered 2017-12-19: 50 mg via ORAL
  Administered 2017-12-19: 100 mg via ORAL
  Filled 2017-12-16 (×3): qty 1

## 2017-12-16 MED ORDER — TAMSULOSIN HCL 0.4 MG PO CAPS
0.4000 mg | ORAL_CAPSULE | Freq: Every day | ORAL | Status: DC
  Administered 2017-12-17 – 2017-12-19 (×3): 0.4 mg via ORAL
  Filled 2017-12-16 (×3): qty 1

## 2017-12-16 MED ORDER — ONDANSETRON HCL 4 MG/2ML IJ SOLN
4.0000 mg | Freq: Once | INTRAMUSCULAR | Status: AC
  Administered 2017-12-16: 4 mg via INTRAVENOUS
  Filled 2017-12-16: qty 2

## 2017-12-16 MED ORDER — SENNA 8.6 MG PO TABS
1.0000 | ORAL_TABLET | Freq: Every day | ORAL | Status: DC
  Administered 2017-12-16 – 2017-12-18 (×3): 8.6 mg via ORAL
  Filled 2017-12-16 (×3): qty 1

## 2017-12-16 MED ORDER — RISPERIDONE 0.25 MG PO TABS
0.2500 mg | ORAL_TABLET | Freq: Every day | ORAL | Status: DC
  Administered 2017-12-16 – 2017-12-18 (×3): 0.25 mg via ORAL
  Filled 2017-12-16 (×4): qty 1

## 2017-12-16 MED ORDER — FINASTERIDE 5 MG PO TABS
5.0000 mg | ORAL_TABLET | Freq: Every day | ORAL | Status: DC
  Administered 2017-12-17 – 2017-12-19 (×3): 5 mg via ORAL
  Filled 2017-12-16 (×3): qty 1

## 2017-12-16 MED ORDER — POLYVINYL ALCOHOL 1.4 % OP SOLN
1.0000 [drp] | Freq: Three times a day (TID) | OPHTHALMIC | Status: DC
  Administered 2017-12-16 – 2017-12-19 (×9): 1 [drp] via OPHTHALMIC
  Filled 2017-12-16 (×2): qty 15

## 2017-12-16 MED ORDER — METHADONE HCL 5 MG PO TABS
5.0000 mg | ORAL_TABLET | Freq: Three times a day (TID) | ORAL | Status: DC
  Administered 2017-12-16 – 2017-12-19 (×9): 5 mg via ORAL
  Filled 2017-12-16 (×9): qty 1

## 2017-12-16 MED ORDER — ENSURE ENLIVE PO LIQD
237.0000 mL | Freq: Two times a day (BID) | ORAL | Status: DC
  Administered 2017-12-17 – 2017-12-19 (×6): 237 mL via ORAL
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
## 2017-12-16 NOTE — ED Triage Notes (Addendum)
Pt to ED via [facility] from [facility]--with c/o headache, dizziness, high blood pressure, and coffee ground emesis this am. Pt is alert/oriented x 4- states "I haven't felt like I normally do this week. Then this morning at 0730 I started feeling dizzy" Has not had BP meds yet today. Pt has a red mark on top of head with swelling-- states he "bumped his head 2 nights ago" denies any LOC, PERLA

## 2017-12-16 NOTE — H&P (Signed)
Date: 12/16/2017               Patient Name:  Zachary [name] MRN: [id number]  DOB: 1929/10/01 Age / Sex: 84 y.o., male   PCP: Jean Rosenthal, MD         Medical Service: Internal Medicine Teaching Service         Attending Physician: Dr. Aldine Contes, MD    First Contact: Dr. Kathi Ludwig Pager: [phone]  Second Contact: Dr. Lorella Nimrod Pager: [phone]       After Hours (After 5p/  First Contact Pager: [phone]  weekends / holidays): Second Contact Pager: [phone]   Chief Complaint: dizziness  History of Present Illness: 84 year old male with past medical history of CKD 3, hypertension, GERD, BPH.  Zachary [name] reports that he woke up this morning sat on the side of his bed for a while as he often does.  He got up to use the bathroom started feeling extremely funny sat down and noticed that the room was spinning he began to become very nauseous and had a vomiting episode.  He denied any chest pain or shortness of breath he does endorse diplopia, and an associated headache.  He did not notice any palpitations his heart was not racing.  He lives in assisted living facility, he is weak at baseline needs assistant with ADLs walks with a cane and walker.  He has no prior history of strokes never had anything like this happen before although on chart review he had some symptoms back in March that were similar in presentation as far as beginning when he was getting up from a seated position.  At that time no nystagmus was noted and the patient denied feeling as if the room was spinning.    ED course: CT head negative, CTA chest abd pelvis negative, MRI/MRA moderate 60% left proximal ICA stenosis, small chronic infarcts within the left inferior cerebellum, right thalamus and left lentiform nucleus.    Meds:  Current Meds  Medication Sig  . acetaminophen (TYLENOL) 500 MG tablet Take 500 mg by mouth every 6 (six) hours as needed.  Marland Kitchen aspirin 81 MG EC tablet Take 1 tablet (81 mg total)  by mouth daily. Swallow whole.  . carboxymethylcellulose (REFRESH PLUS) 0.5 % SOLN Place 1 drop into both eyes 3 (three) times daily.  . feeding supplement (ENSURE IMMUNE HEALTH) LIQD Take 237 mLs by mouth 3 (three) times daily with meals.  . finasteride (PROSCAR) 5 MG tablet Take 1 tablet (5 mg total) by mouth daily.  . fluticasone (FLONASE) 50 MCG/ACT nasal spray Place 2 sprays into both nostrils daily.  . furosemide (LASIX) 40 MG tablet Take 1 tablet (40 mg total) by mouth daily.  Marland Kitchen ibuprofen (ADVIL,MOTRIN) 600 MG tablet TAKE 1 TABLET (600 MG TOTAL) BY MOUTH 2 (TWO) TIMES DAILY.  Marland Kitchen lisinopril (PRINIVIL,ZESTRIL) 5 MG tablet TAKE 1 TABLET (5 MG TOTAL) BY MOUTH DAILY.  Marland Kitchen LORazepam (ATIVAN) 0.5 MG tablet Take 1 tablet (0.5 mg total) every 12 (twelve) hours by mouth. In applesauce, dosing to be supervised by caregiver (Patient taking differently: Take 0.5 mg by mouth 3 (three) times daily. In applesauce, dosing to be supervised by caregiver)  . methadone (DOLOPHINE) 5 MG tablet Take 1 tablet (5 mg total) by mouth every 8 (eight) hours. Rx 3 of 3.  . mirtazapine (REMERON) 15 MG tablet Take 15 mg by mouth at bedtime.   . Multiple Vitamin (TAB-A-VITE) TABS Take 1 tablet by  mouth daily.  Marland Kitchen omeprazole (PRILOSEC) 20 MG capsule Take 1 capsule (20 mg total) by mouth daily.  . polyethylene glycol powder (GLYCOLAX/MIRALAX) powder Take 17 g by mouth daily.  . risperiDONE (RISPERDAL) 0.25 MG tablet Take 0.25 mg by mouth 2 (two) times daily.  Marland Kitchen senna (SENOKOT) 8.6 MG tablet Take 3 tablets by mouth at bedtime.   . sertraline (ZOLOFT) 100 MG tablet Take 150 mg by mouth daily.   . Tamsulosin HCl (FLOMAX) 0.4 MG CAPS Take 1 capsule (0.4 mg total) by mouth daily.  . traMADol (ULTRAM) 50 MG tablet Take 1 tablet (50 mg total) by mouth daily as needed for moderate pain. (Patient taking differently: Take 50 mg by mouth every 6 (six) hours as needed for moderate pain. )     Allergies: Allergies as of 12/16/2017 -  Review Complete 12/16/2017  Allergen Reaction Noted  . Clindamycin/lincomycin Rash 01/23/2013  . Lincomycin hcl Rash 01/23/2013   Past Medical History:  Diagnosis Date  . Alcoholism ([place])    Remote  . Anxiety 06/22/2006  . Aortic atherosclerosis ([place]) 02/26/2015   Seen on CT scan, currently asymptomatic  . Arthritis of hip 05/21/2010   s/p left hip total arthroplasty   . Benign prostatic hypertrophy with urinary obstruction 06/22/2006  . Cataract   . Chronic kidney disease, stage 3 ([place]) 11/08/2013  . Chronic pain disorder 06/22/2006  . Constipation due to pain medication 06/03/2011  . Diverticulosis of colon 06/22/2006  . Essential hypertension 06/22/2006  . Facial basal cell cancer 06/22/2006  . Gastritis 07/06/2012   Seen on EGD 08/02/2006   . Gastroesophageal reflux disease with stricture 06/22/2006   With esophagitis, requiring dilatation 08/02/2006   . Irritable bowel syndrome   . Major depression 06/22/2006  . Obesity (BMI 30.0-34.9) 07/06/2012  . Osteoarthritis of left knee 03/07/2013  . Squamous cell skin cancer 07/06/2012   In situ   . Swelling of left lower extremity 12/07/2012   Responds to lasix 40 mg daily       Family History:  Family History  Problem Relation Age of Onset  . Pneumonia Mother 76  . Heart attack Father 41  . Early death Brother        Specifics unknown  . Healthy Daughter   . Healthy Son   . Heart disease Brother        Specifics unknown  . Pancreatic cancer Sister   . Early death Sister 3       Hit by truck on [address]  . Healthy Daughter     Social History:  Social History   Socioeconomic History  . Marital status: Widowed    Spouse name: Not on file  . Number of children: Not on file  . Years of education: Not on file  . Highest education level: Not on file  Occupational History  . Not on file  Social Needs  . Financial resource strain: Not on file  . Food insecurity:    Worry: Not on file    Inability: Not on file  . Transportation needs:     Medical: Not on file    Non-medical: Not on file  Tobacco Use  . Smoking status: Former Smoker    Packs/day: 1.00    Years: 25.00    Pack years: 25.00    Last attempt to quit: 06/20/1978    Years since quitting: 39.5  . Smokeless tobacco: Never Used  Substance and Sexual Activity  . Alcohol use: No  .  Drug use: No  . Sexual activity: Not on file  Lifestyle  . Physical activity:    Days per week: Not on file    Minutes per session: Not on file  . Stress: Not on file  Relationships  . Social connections:    Talks on phone: Not on file    Gets together: Not on file    Attends religious service: Not on file    Active member of club or organization: Not on file    Attends meetings of clubs or organizations: Not on file    Relationship status: Not on file  . Intimate partner violence:    Fear of current or ex partner: Not on file    Emotionally abused: Not on file    Physically abused: Not on file    Forced sexual activity: Not on file  Other Topics Concern  . Not on file  Social History Narrative   Mr Oregel has been divorced for decades. He has 2 adult children probably in [place] with no contact.  Had a sig other who died years.  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  Maintains some contact with his sister and some nephews, but seems to have a lonely existence.  He lives in an assisting living facility in [place].  History has included smoking and periods of drinking (details of this are unclear)    Review of Systems: A complete ROS was negative except as per HPI.   Physical Exam: Blood pressure (!) 196/92, pulse 85, temperature 98.1 F (36.7 C), resp. rate 13, height 5\' 7"  (1.702 m), weight 206 lb (93.4 kg), SpO2 97 %. Physical Exam  Constitutional: He appears well-developed and well-nourished.  Eyes: Right eye exhibits no discharge. Left eye exhibits no discharge. No scleral  icterus.  Cardiovascular: Normal rate, regular rhythm, normal heart sounds and intact distal pulses. Exam reveals no gallop and no friction rub.  No murmur heard. Pulmonary/Chest: Effort normal and breath sounds normal. No respiratory distress. He has no wheezes. He has no rales.  Abdominal: Soft. Bowel sounds are normal. He exhibits no distension and no mass. There is no tenderness. There is no guarding.  Neurological: He is alert.  II: There is horizontal nystagmus present left and right this makes the patient very dizzy and nauseous during exam,;Visual fields intact;PERRL. III,IV, FM:[id number] not present,EOMI without nystagmus V,VII:No facial droop. Facial tempsensation equalbilaterally VIII: hearingintact to voice IX,X:Palaterises symmetrically LD:[id number] shrug XII: midline tongue extension Motor: No focal weakness can move against gravity and some resistance, generally weak and deconditioned Sensory:Tempand light touch intact x 4 Cerebellar:difficult to assess finger to nose given nystagmus, Heel to shin is normal Gait:Deferred     EKG: personally reviewed my interpretation is sinus rhythm  CXR: personally reviewed my interpretation is none available  Assessment & Plan by Problem: Active Problems:   Vertigo  Vertigo: suspected cause is posterior stroke, no acute findings on imaging to explain symptoms.  Exam does not support BPPV or other peripheral causes seems to be a central cause.  Does have orthostatic hypotension history but does not explain nystagmus.  -treat as acute stroke-permissive HTN, stroke workup in process, imaging findings above, ECHO with bubble study pending -will consult vestibular PT -continuous cardiac monitoring  HTN: on lisinopril 5mg ,   -holding for permissive htn  Hyperkalemia:  Baseline K seems to range on the higher end of normal in ED  is 5.3  -will monitor and if still elevated in am will give home  lasix  BPH  -continue home tamsulosin and finasteride  CKD3  -continue to monitor -avoid nephrotoxic agents  HLD: LDL 122 today  -will start rosuvastatin 20mg    Anxiety: chronic on ativan TID 0.5mg  at home, risperidone 0.25mg  Daily, mirtazapine 0.25mg  daily  -ativan 0.5mg  TID, risperidone 0.25mg  Daily, mirtazapine 0.25mg  daily  Dispo: Admit patient to Inpatient with expected length of stay greater than 2 midnights.  Signed: Katherine Roan, MD 12/16/2017, 6:31 PM

## 2017-12-16 NOTE — ED Notes (Signed)
Patient transported to CT 
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
## 2017-12-17 ENCOUNTER — Inpatient Hospital Stay (HOSPITAL_COMMUNITY): Payer: Medicare Other

## 2017-12-17 ENCOUNTER — Encounter (HOSPITAL_COMMUNITY): Payer: Self-pay | Admitting: Neurology

## 2017-12-17 DIAGNOSIS — Z7982 Long term (current) use of aspirin: Secondary | ICD-10-CM

## 2017-12-17 DIAGNOSIS — K219 Gastro-esophageal reflux disease without esophagitis: Secondary | ICD-10-CM

## 2017-12-17 DIAGNOSIS — F419 Anxiety disorder, unspecified: Secondary | ICD-10-CM

## 2017-12-17 DIAGNOSIS — I129 Hypertensive chronic kidney disease with stage 1 through stage 4 chronic kidney disease, or unspecified chronic kidney disease: Secondary | ICD-10-CM

## 2017-12-17 DIAGNOSIS — N183 Chronic kidney disease, stage 3 (moderate): Secondary | ICD-10-CM

## 2017-12-17 DIAGNOSIS — H5712 Ocular pain, left eye: Secondary | ICD-10-CM

## 2017-12-17 DIAGNOSIS — E785 Hyperlipidemia, unspecified: Secondary | ICD-10-CM

## 2017-12-17 DIAGNOSIS — R112 Nausea with vomiting, unspecified: Secondary | ICD-10-CM

## 2017-12-17 DIAGNOSIS — I6330 Cerebral infarction due to thrombosis of unspecified cerebral artery: Secondary | ICD-10-CM

## 2017-12-17 DIAGNOSIS — H532 Diplopia: Secondary | ICD-10-CM

## 2017-12-17 DIAGNOSIS — E875 Hyperkalemia: Secondary | ICD-10-CM

## 2017-12-17 DIAGNOSIS — I10 Essential (primary) hypertension: Secondary | ICD-10-CM

## 2017-12-17 DIAGNOSIS — Z79899 Other long term (current) drug therapy: Secondary | ICD-10-CM

## 2017-12-17 DIAGNOSIS — N400 Enlarged prostate without lower urinary tract symptoms: Secondary | ICD-10-CM

## 2017-12-17 DIAGNOSIS — I6522 Occlusion and stenosis of left carotid artery: Secondary | ICD-10-CM

## 2017-12-17 DIAGNOSIS — C4490 Unspecified malignant neoplasm of skin, unspecified: Secondary | ICD-10-CM

## 2017-12-17 DIAGNOSIS — G893 Neoplasm related pain (acute) (chronic): Secondary | ICD-10-CM

## 2017-12-17 HISTORY — DX: Cerebral infarction due to thrombosis of unspecified cerebral artery: I63.30

## 2017-12-17 LAB — BASIC METABOLIC PANEL
Anion gap: 4 — ABNORMAL LOW (ref 5–15)
BUN: 26 mg/dL — ABNORMAL HIGH (ref 8–23)
CALCIUM: 8.3 mg/dL — AB (ref 8.9–10.3)
CO2: 26 mmol/L (ref 22–32)
CREATININE: 1.85 mg/dL — AB (ref 0.61–1.24)
Chloride: 107 mmol/L (ref 98–111)
GFR calc Af Amer: 37 mL/min — ABNORMAL LOW (ref >60)
GFR calc non Af Amer: 32 mL/min — ABNORMAL LOW (ref >60)
GLUCOSE: 94 mg/dL (ref 70–99)
Potassium: 4.2 mmol/L (ref 3.5–5.1)
Sodium: 137 mmol/L (ref 135–145)

## 2017-12-17 LAB — MRSA PCR SCREENING: MRSA by PCR: NEGATIVE

## 2017-12-17 MED ORDER — ASPIRIN 81 MG PO CHEW
81.0000 mg | CHEWABLE_TABLET | Freq: Every day | ORAL | Status: DC
  Administered 2017-12-17 – 2017-12-19 (×3): 81 mg via ORAL
  Filled 2017-12-17 (×3): qty 1

## 2017-12-17 MED ORDER — CLOPIDOGREL BISULFATE 75 MG PO TABS
75.0000 mg | ORAL_TABLET | Freq: Every day | ORAL | Status: DC
  Administered 2017-12-18 – 2017-12-19 (×2): 75 mg via ORAL
  Filled 2017-12-17 (×2): qty 1

## 2017-12-17 MED ORDER — LISINOPRIL 5 MG PO TABS
5.0000 mg | ORAL_TABLET | Freq: Every day | ORAL | Status: DC
  Administered 2017-12-17 – 2017-12-18 (×2): 5 mg via ORAL
  Filled 2017-12-17 (×2): qty 1

## 2017-12-17 NOTE — Progress Notes (Signed)
   Subjective: Patient was complaining of left eye pain, stating that his skin cancer is pressing on his eyes.  He was also experiencing some photophobia.  He denies any more dizziness or double vision.  Objective:  Vital signs in last 24 hours: Vitals:   12/17/17 0031 12/17/17 0516 12/17/17 0848 12/17/17 1259  BP: (!) 136/58 (!) 179/81 (!) 190/85 (!) 184/75  Pulse: 71 69 72 74  Resp: 18 18 20 20   Temp: 97.8 F (36.6 C) (!) 97.5 F (36.4 C) 98.3 F (36.8 C)   TempSrc: Oral Oral Oral   SpO2: 95% 92% 96% 95%  Weight:      Height:       General.  Well-developed elderly man, lying comfortably in his bed, in no acute distress. HEENT.  Normocephalic, atraumatic,EOMI, no nystagmus or diplopia, prefer to keep his left eye closed, left eye with mild crusting, no erythema or obvious discharge. Neuro.  Alert and oriented, cranial nerves grossly intact, strength and sensation intact and symmetrical bilaterally, no focal deficit. Lungs.  Clear bilaterally, normal effort. CV.  Regular rate and rhythm, no murmur/rub/gallop. Abdomen.  Soft, mild suprapubic tenderness, no distention or guarding, bowel sounds positive.   Extremities.  Dry scaly skin, no edema or cyanosis, pulses intact and symmetrical bilaterally.  Assessment/Plan:  84 year old gentleman presented with acute onset vertigo and dizziness, concern for posterior circulation infarct despite all imaging including MRI and MRA are negative for any acute infarct.  Vertigo: Improved today with no nystagmus. Initial concern for central nystagmus, TIA can be a possibility. Stroke work-up so far is negative which include CT head, MRI and MRA of brain.  MRA with 60% left ICA stenosis, MRI with chronic left inferior cerebellum, right thalamus and left lentiform nucleus infarcts. Echo with grade 1 diastolic dysfunction, normal ejection fraction, and mildly elevated pulmonary pressure. A1c 5.7 Lipid panel with elevated total cholesterol, LDL at  122 and HDL of 31. PT evaluation pending. Vestibular PT pending. -Continue aspirin and statin.  Hypertension.  Blood pressure elevated this morning. Home dose of lisinopril 5 mg daily was held because of permissive hypertension initially. -Restarted home dose of lisinopril 5 mg daily.  Hyperkalemia.  Resolved with potassium of 4.2 today.  Left eye pain.  There was mild crusting without any obvious congestion, erythema or discharge.  Patient to follow-up with [facility] ophthalmology, last visit was in April 2017.  Patient with history of chronic left eye pain and corneal scarring secondary to skin cancer. -Need a follow-up appointment with ophthalmology.  BPH -continue home tamsulosin and finasteride  CKD3.  Creatinine around baseline, at 1.85 today -continue to monitor -avoid nephrotoxic agents  HLD: LDL 122 today. -Continue rosuvastatin 20mg    Anxiety: chronic on ativan TID 0.5mg  at home, risperidone 0.25mg  Daily, mirtazapine 0.25mg  daily -Continue Ativan 0.5mg  TID, risperidone 0.25mg  Daily, mirtazapine 0.25mg  daily  Dispo: Anticipated discharge in approximately 1 day(s).   Lorella Nimrod, MD 12/17/2017, 1:13 PM Pager: [phone]

## 2017-12-17 NOTE — Progress Notes (Signed)
  Echocardiogram 2D Echocardiogram has been performed.  Zachary [name] T Briell Paulette 12/17/2017, 10:28 AM

## 2017-12-17 NOTE — Progress Notes (Signed)
  Date: 12/17/2017  Patient name: Zachary [name]  Medical record number: [id number]  Date of birth: 11-13-1929   I have seen and evaluated Zachary [name] and discussed their care with the Residency Team.  In brief, patient is 84 year old male with past medical history of CKD stage III, hypertension, GERD, BPH who presented with dizziness x1 day.  Patient states that yesterday he got up to the bathroom and started feeling dizzy and noted that the room was spinning.  Patient also complained of double vision and had an associated headache.  He had one episode of nausea and vomiting at his facility.  He is brought to the ED for further work-up.  Patient denies any shortness of breath, no chest pain, no palpitations, no syncope, no focal weakness, no tingling or numbness, no abdominal pain, no diarrhea, no fevers or chills.  Patient was seen by neurology here who noted left beating nystagmus and diplopia on rightward gaze and were suspicious for a small posterior circulation infarct.  Patient states that his left eye hurts today.  He states that his lightheadedness has improved but still has occasional diplopia.  PMHx, Fam Hx, and/or Soc Hx : As per resident admit note  Vitals:   12/17/17 0516 12/17/17 0848  BP: (!) 179/81 (!) 190/85  Pulse: 69 72  Resp: 18 20  Temp: (!) 97.5 F (36.4 C) 98.3 F (36.8 C)  SpO2: 92% 96%   General: Awake, alert, oriented x3, NAD CVS: Regular rate and rhythm, normal heart sounds Lungs: CTA bilaterally Abdomen: Soft, nontender, nondistended, normoactive bowel sounds Extremities: Trace bilateral lower extremity edema noted Eyes: Some crusting noted around left eye, no erythema, no nystagmus noted today Neuro: Awake, alert, oriented x3, no focal weakness noted, patient still with diplopia Psych: Patient with normal behavior normal affect   Assessment and Plan: I have seen and evaluated the patient as outlined above. I agree with the formulated Assessment and  Plan as detailed in the residents' note, with the following changes:   1.  Vertigo: -Patient presented to the ED with one episode of nausea and vomiting secondary to vertigo and lightheadedness as well as associated double vision.  He was noted to have nystagmus on admission here and diplopia on rightward gaze by neurology.  His symptoms are suspicious for a posterior circulation infarct. -Neuro follow-up and recommendations appreciated -MRI brain/MRA head/neck results noted.  Patient with chronic microvascular changes as well as small chronic infarcts in the left inferior cerebellum, rightthe limbs and left lentiform nucleus and moderate left ICA stenosis (60%) -We will allow for permissive hypertension for now -2D echo with bubble study pending -We will follow-up vestibular PT consult -Continue with aspirin and high intensity statin  Aldine Contes, MD 6/30/201911:38 AM

## 2017-12-17 NOTE — Progress Notes (Signed)
PT Cancellation Note  Patient Details Name: JOEL COWIN MRN: [id number] DOB: 1930-06-10   Cancelled Treatment:    Reason Eval/Treat Not Completed: Patient at procedure or test/unavailable (just began with echo). Will follow-up for PT evaluation as schedule permits.  Mabeline Caras, PT, DPT Acute Rehab Services  Pager: [place] 12/17/2017, 9:41 AM

## 2017-12-17 NOTE — Evaluation (Addendum)
Physical Therapy Evaluation Patient Details Name: Zachary [name] MRN: [id number] DOB: 1930-03-13 Today's Date: 12/17/2017   History of Present Illness  Pt is an 84 y.o. male admitted 12/16/17 with c/o dizziness, falls, nausea/vomitting. MRI shows no acute intracranial abnormality; small chronic infarcts within the left inferior cerebellum, right thalamus, and left lentiform nucleus. PMH includes CKD III, HTN, L knee OA, chronic pain disorder, skin CA w/ surgery to L eye.     Clinical Impression  Pt presents with an overall decrease in functional mobility secondary to above. PTA, pt lives at ALF where he is mod indep ambulating with RW, intermittent assist for ADLs; endorses one fall in the past month. Today, pt required minA for standing with RW; minA to prevent LOB while taking steps, but "dizziness" limiting further mobility. BP values high throughout (see below). Nystagmus noted with L lateral gaze during gross vestibular screen (will plan for further evaluation by vestibular PT next visit). Pt would benefit from continued acute PT services to maximize functional mobility and independence prior to d/c with SNF-level therapies.  Seated BP 188/82 Standing BP 170s/90 Standing 3 minutes BP 182/69     Follow Up Recommendations SNF;Supervision for mobility/OOB    Equipment Recommendations  Rolling walker with 5" wheels    Recommendations for Other Services       Precautions / Restrictions Precautions Precautions: Fall Restrictions Weight Bearing Restrictions: No      Mobility  Bed Mobility Overal bed mobility: Needs Assistance Bed Mobility: Supine to Sit     Supine to sit: Min assist     General bed mobility comments: Significant increased time and effort; minA to assist hips to EOB  Transfers Overall transfer level: Needs assistance Equipment used: Rolling walker (2 wheeled) Transfers: Sit to/from Stand Sit to Stand: Min assist         General transfer comment: Pt able  to stand on second attempt with minA for trunk elevation and to steady RW; c/o dizziness. Cues for correct hand placement  Ambulation/Gait Ambulation/Gait assistance: Min assist Gait Distance (Feet): 2 Feet Assistive device: Rolling walker (2 wheeled) Gait Pattern/deviations: Step-to pattern Gait velocity: Decreased Gait velocity interpretation: <1.8 ft/sec, indicate of risk for recurrent falls General Gait Details: Ambulation limited to side steps at [facility] secondary to c/o dizziness. Took steps with RW and minA to maintain balance; bilat knee instability noted  Stairs            Wheelchair Mobility    Modified Rankin (Stroke Patients Only)       Balance Overall balance assessment: Needs assistance   Sitting balance-Leahy Scale: Fair       Standing balance-Leahy Scale: Poor Standing balance comment: Heavy reliance on BUE support                             Pertinent Vitals/Pain Pain Assessment: No/denies pain    Home Living Family/patient expects to be discharged to:: Assisted living               Home Equipment: Kasandra Knudsen - single point Additional Comments: From [facility]    Prior Function Level of Independence: Independent with assistive device(s)         Comments: Pt reports mod indep ambulation with SPC. Mostly indep with ADLs. Meals provided     Hand Dominance        Extremity/Trunk Assessment   Upper Extremity Assessment Upper Extremity Assessment: Generalized weakness  Lower Extremity Assessment Lower Extremity Assessment: Generalized weakness    Cervical / Trunk Assessment Cervical / Trunk Assessment: Kyphotic  Communication   Communication: No difficulties  Cognition Arousal/Alertness: Awake/alert Behavior During Therapy: WFL for tasks assessed/performed Overall Cognitive Status: Impaired/Different from baseline Area of Impairment: Following commands;Safety/judgement;Problem solving                        Following Commands: Follows multi-step commands inconsistently Safety/Judgement: Decreased awareness of deficits   Problem Solving: Requires verbal cues        General Comments      Exercises     Assessment/Plan    PT Assessment Patient needs continued PT services  PT Problem List Decreased strength;Decreased activity tolerance;Decreased balance;Decreased mobility;Decreased cognition;Decreased knowledge of use of DME       PT Treatment Interventions DME instruction;Gait training;Stair training;Functional mobility training;Therapeutic activities;Therapeutic exercise;Balance training;Neuromuscular re-education;Patient/family education    PT Goals (Current goals can be found in the Care Plan section)  Acute Rehab PT Goals Patient Stated Goal: Sort out dizziness PT Goal Formulation: With patient Time For Goal Achievement: 12/31/17 Potential to Achieve Goals: Fair    Frequency Min 2X/week   Barriers to discharge        Co-evaluation               AM-PAC PT "6 Clicks" Daily Activity  Outcome Measure Difficulty turning over in bed (including adjusting bedclothes, sheets and blankets)?: None Difficulty moving from lying on back to sitting on the side of the bed? : Unable Difficulty sitting down on and standing up from a chair with arms (e.g., wheelchair, bedside commode, etc,.)?: Unable Help needed moving to and from a bed to chair (including a wheelchair)?: A Little Help needed walking in hospital room?: A Lot Help needed climbing 3-5 steps with a railing? : A Lot 6 Click Score: 13    End of Session Equipment Utilized During Treatment: Gait belt Activity Tolerance: Patient tolerated treatment well Patient left: in bed;with call bell/phone within reach;with bed alarm set Nurse Communication: Mobility status PT Visit Diagnosis: Other abnormalities of gait and mobility (R26.89);Muscle weakness (generalized) (M62.81)    Time: 9767-3419 PT Time  Calculation (min) (ACUTE ONLY): 26 min   Charges:   PT Evaluation $PT Eval Moderate Complexity: 1 Mod PT Treatments $Therapeutic Activity: 8-22 mins   PT G Codes:       Mabeline Caras, PT, DPT Acute Rehab Services  Pager: [place] 12/17/2017, 2:09 PM

## 2017-12-17 NOTE — Progress Notes (Signed)
patient arrived to the floor from ED.  Vital signs are stable and pt reports no distress, just lingering headache which he has already been medicated for.  Unit rules were explained and pt has no further questions at this time.  Bed alarm is on and call bell is within reach.  Pt eating [place] sandwich.

## 2017-12-17 NOTE — Progress Notes (Addendum)
STROKE TEAM PROGRESS NOTE   HISTORY OF PRESENT ILLNESS (per record) Zachary [name] is an 84 y.o. male who has a complex medical history listed below. He lives at a long term care facility where upon review of their notes has been having frequent falls. He has a laceration on the top of his head, that he tells me happened a few days ago. He says he has been feeling poorly "all week". This morning he reports being so dizzy he couldn't walk and had nausea and vomited. Facility reported that it was "coffee ground emesis", however the pt states he was drinking his coffee when he became nauseous and vomited. He denies any abd pain, but remains quite nauseous to even the slightest movements during exam. At baseline he is in poor health, needs assistance with ADLs, walks with walker or cane (mRS 4).    SUBJECTIVE (INTERVAL HISTORY) No family members present.  The patient is somewhat of a poor historian.  He believes his symptoms started around 8 or 830 yesterday morning and felt that the room was spinning around him.  He believes that the emesis preceded the dizziness.  Apparently he has had some abdominal concerns recently.  Today he feels much better.  He does not have any dizziness although he does note a pressure sensation in the center of his forehead which he has never experienced before.  It is a mild discomfort.  He also reports that he had surgery on his left eye approximately 6 to 7 years ago for a skin cancer.  Since that time he has had a ptosis.  The patient's dizziness subsided sometime during the night.  He also endorsed that he had difficulty reading newspaper about 2 weeks ago, also started to have intermittent HA, from back to front, comes and goes for the last 2 weeks. However, last night HA was the worst, 9/10.    OBJECTIVE Temp:  [97.5 F (36.4 C)-98.2 F (36.8 C)] 97.5 F (36.4 C) (06/30 0516) Pulse Rate:  [69-85] 69 (06/30 0516) Cardiac Rhythm: Normal sinus rhythm (06/30 0700) Resp:   [11-18] 18 (06/30 0516) BP: (136-217)/(58-92) 179/81 (06/30 0516) SpO2:  [91 %-99 %] 92 % (06/30 0516) Weight:  [206 lb (93.4 kg)] 206 lb (93.4 kg) (06/29 1246)  CBC:  Recent Labs  Lab 12/16/17 1335  WBC 5.5  NEUTROABS 3.9  HGB 13.5  HCT 41.2  MCV 98.1  PLT 662    Basic Metabolic Panel:  Recent Labs  Lab 12/16/17 1335 12/17/17 0527  NA 137 137  K 5.3* 4.2  CL 103 107  CO2 26 26  GLUCOSE 118* 94  BUN 27* 26*  CREATININE 1.90* 1.85*  CALCIUM 8.6* 8.3*    Lipid Panel:     Component Value Date/Time   CHOL 201 (H) 12/16/2017 1335   TRIG 240 (H) 12/16/2017 1335   HDL 31 (L) 12/16/2017 1335   CHOLHDL 6.5 12/16/2017 1335   VLDL 48 (H) 12/16/2017 1335   LDLCALC 122 (H) 12/16/2017 1335   HgbA1c:  Lab Results  Component Value Date   HGBA1C 5.7 (H) 12/16/2017   Urine Drug Screen:     Component Value Date/Time   LABOPIA NONE DETECTED 12/16/2017 1549   COCAINSCRNUR NONE DETECTED 12/16/2017 1549   LABBENZ NONE DETECTED 12/16/2017 1549   AMPHETMU NONE DETECTED 12/16/2017 1549   THCU NONE DETECTED 12/16/2017 1549   LABBARB (A) 12/16/2017 1549    Result not available. Reagent lot number recalled by manufacturer.  Alcohol Level No results found for: [facility]   IMAGING I have personally reviewed the radiological images below and agree with the radiology interpretations.  Ct Head Wo Contrast 12/16/2017 IMPRESSION:  1. Negative for bleed or other acute intracranial process.  2. Atrophy and nonspecific white matter changes.   Mri and  Mra Head Wo Contrast Mr Zachary [name] Neck Wo Contrast 12/16/2017 IMPRESSION:  1. No acute intracranial abnormality identified.  2. Moderate chronic microvascular ischemic changes and parenchymal volume loss of the brain. Small chronic infarcts within the left inferior cerebellum, right thalamus, and left lentiform nucleus.  3. No large vessel occlusion or aneurysm.  4. Moderate 60% left proximal ICA stenosis. No additional segment of  hemodynamically significant stenosis by NASCET criteria.    Ct Angio Chest/abd/pel For Dissection W And/or Wo Contrast 12/16/2017 IMPRESSION:  CTA of the chest: No evidence of aneurysmal dilatation or dissection. No pulmonary embolus is seen. Mild dependent changes in the lungs bilaterally. CT of the abdomen and pelvis: No evidence of aneurysmal dilatation or dissection is seen. Minimal free fluid within the pelvis which may be physiologic in nature.    Transthoracic Echocardiogram with bubble study 12/17/2017 Left ventricle: The cavity size was normal. Wall thickness was   increased in a pattern of mild LVH. Systolic function was normal.   The estimated ejection fraction was in the range of 55% to 60%.   Wall motion was normal; there were no regional wall motion   abnormalities. Doppler parameters are consistent with abnormal   left ventricular relaxation (grade 1 diastolic dysfunction). - Aortic valve: There was trivial regurgitation. - Ascending aorta: The ascending aorta was mildly dilated. - Pulmonary arteries: Systolic pressure was mildly increased. PA   peak pressure: 33 mm Hg (S). Impressions: - Normal LV systolic function; mild LVH; mild diastolic   dysfunction; mildly dilated ascending aorta; trace TR with mild   pulmonary hypertension.    PHYSICAL EXAM Vitals:   12/16/17 1615 12/16/17 2030 12/17/17 0031 12/17/17 0516  BP: (!) 196/92 (!) 188/90 (!) 136/58 (!) 179/81  Pulse: 85 81 71 69  Resp: 13 18 18 18   Temp:  98.2 F (36.8 C) 97.8 F (36.6 C) (!) 97.5 F (36.4 C)  TempSrc:  Oral Oral Oral  SpO2: 97% 99% 95% 92%  Weight:      Height:        General - 84 year old male in no acute distress although he appears mildly pale. Heart - Regular rate and rhythm - no murmer appreciated Lungs - Clear but decreased breath sounds throughout.   Extremities - Distal pulses weak to absent. Trace to 1+ edema bilaterally. Skin - Warm and dry  Mental Status: Alert, oriented,  thought content appropriate.  Speech fluent without evidence of aphasia.  Able to follow 3 step commands without difficulty. Cranial Nerves: II: Discs not visualized; Visual fields normal, left decrease visual acuity, pupils equal, round, reactive to light. III,IV, VI: ptosis not present, extra-ocular motions intact bilaterally V,VII: smile symmetric, facial light touch sensation normal bilaterally VIII: hearing normal bilaterally, no nystagmus bilaterally IX,X: gag reflex present XI: bilateral shoulder shrug intact. XII: midline tongue extension Motor: RUE - 5/5    LUE - proximal 5/5, distal 4+/5 RLE - 5/5    LLE -  3/5 proximal and 4/5 distal Tone and bulk:normal tone throughout; no atrophy noted Sensory: Light touch mildly decreased on the left side Cerebellar: normal finger-to-nose but performed very slowly bilaterally. Gait: not tested     ASSESSMENT/PLAN Mr.  FAMOUS EISENHARDT is a 84 y.o. male with history of obesity, depression, irritable bowel syndrome, gastritis, basal cell cancer of the face - resected, hypertension, chronic pain disorder, chronic kidney disease, anxiety, and history of alcoholism presenting with nausea vomiting and dizziness. He did not receive IV t-PA due to minimal deficits.  Possible right pontine DWI-negative infarct - given vertigo, N/V, left hemiparesis    Resultant - left mild hemiparesis  CT head - Atrophy and nonspecific white matter changes.  MRI head - no acute abnormalities.  Multiple old infarcts.  MRA head - 60% left internal carotid artery bifurcation stenosis  With repeat limited MRI tomorrow  2D Echo -EF 55 to 60%.  No cardiac source of emboli identified.  LDL - 122  HgbA1c - 5.7  VTE prophylaxis -Lovenox  aspirin 81 mg daily prior to admission, now on aspirin 81 mg daily. Will recommend ASA 81mg  and plavix 75mg  daily for 3 weeks and then plavix alone.   Patient counseled to be compliant with his antithrombotic  medications  Ongoing aggressive stroke risk factor management  Therapy recommendations:  SNF recommended  Disposition:  Pending  Left ICA stenosis  MRA head and neck showed left ICA 60% stenosis  Left visual acuity decreased 2 weeks ago - ?? BRAO  Asymptomatic for current admission  Recommend to follow up with [facility] as outpt  Hypertension  Blood pressure tends to run high but it is within the parameters for permissive hypertension . Permissive hypertension (OK if < 220/120) but gradually normalize in 5-7 days . Long-term BP goal normotensive  Hyperlipidemia  Lipid lowering medication PTA: none  LDL 122 , goal < 70  Current lipid lowering medication: Now on Crestor 20 mg daily   Continue statin at discharge  Other Stroke Risk Factors  Advanced age  Former cigarette smoker - quit more than 30 years ago.  Obesity, Body mass index is 32.26 kg/m., recommend weight loss, diet and exercise as appropriate   Hx stroke/TIA in imaging  Other Active Problems  History of chronic pain -on methadone  CKD with Cre 1.85   Hospital day # 1  I spent  35 minutes in total face-to-face time with the patient, more than 50% of which was spent in counseling and coordination of care, reviewing test results, images and medication, and discussing the diagnosis of vertigo, DWI negative stroke, treatment plan and potential prognosis. This patient's care requiresreview of multiple databases, neurological assessment, discussion with pt, other specialists and medical decision making of high complexity.   Rosalin Hawking, MD PhD Stroke Neurology 12/17/2017 10:02 PM      To contact Stroke Continuity provider, please refer to [email or web address] After hours, contact General Neurology

## 2017-12-18 ENCOUNTER — Inpatient Hospital Stay (HOSPITAL_COMMUNITY): Payer: Medicare Other

## 2017-12-18 DIAGNOSIS — G8194 Hemiplegia, unspecified affecting left nondominant side: Secondary | ICD-10-CM

## 2017-12-18 DIAGNOSIS — I6350 Cerebral infarction due to unspecified occlusion or stenosis of unspecified cerebral artery: Secondary | ICD-10-CM

## 2017-12-18 DIAGNOSIS — I10 Essential (primary) hypertension: Secondary | ICD-10-CM

## 2017-12-18 DIAGNOSIS — Z85828 Personal history of other malignant neoplasm of skin: Secondary | ICD-10-CM

## 2017-12-18 DIAGNOSIS — G8929 Other chronic pain: Secondary | ICD-10-CM

## 2017-12-18 LAB — BASIC METABOLIC PANEL
Anion gap: 7 (ref 5–15)
BUN: 30 mg/dL — AB (ref 8–23)
CO2: 27 mmol/L (ref 22–32)
CREATININE: 1.73 mg/dL — AB (ref 0.61–1.24)
Calcium: 9 mg/dL (ref 8.9–10.3)
Chloride: 105 mmol/L (ref 98–111)
GFR calc Af Amer: 40 mL/min — ABNORMAL LOW (ref >60)
GFR calc non Af Amer: 34 mL/min — ABNORMAL LOW (ref >60)
Glucose, Bld: 102 mg/dL — ABNORMAL HIGH (ref 70–99)
Potassium: 4.5 mmol/L (ref 3.5–5.1)
Sodium: 139 mmol/L (ref 135–145)

## 2017-12-18 MED ORDER — LISINOPRIL 5 MG PO TABS: 5.0000 mg | ORAL_TABLET | Freq: Every day | ORAL | Status: DC

## 2017-12-18 MED ORDER — ENOXAPARIN SODIUM 40 MG/0.4ML ~~LOC~~ SOLN
40.0000 mg | SUBCUTANEOUS | Status: DC
  Administered 2017-12-19: 40 mg via SUBCUTANEOUS
  Filled 2017-12-18: qty 0.4

## 2017-12-18 MED ORDER — LISINOPRIL 20 MG PO TABS
20.0000 mg | ORAL_TABLET | Freq: Every day | ORAL | Status: DC
  Administered 2017-12-18 – 2017-12-19 (×2): 20 mg via ORAL
  Filled 2017-12-18 (×2): qty 1

## 2017-12-18 MED ORDER — LISINOPRIL 20 MG PO TABS: 20.0000 mg | ORAL_TABLET | Freq: Every day | ORAL | Status: DC

## 2017-12-18 MED ORDER — FUROSEMIDE 40 MG PO TABS
40.0000 mg | ORAL_TABLET | Freq: Every day | ORAL | Status: DC
  Administered 2017-12-18 – 2017-12-19 (×2): 40 mg via ORAL
  Filled 2017-12-18 (×2): qty 1

## 2017-12-18 NOTE — Progress Notes (Signed)
   Subjective: Visited the patient. No more vertigo but has some mild dizziness intermittently. Could walk around but complains of some mild weakness at left side. Has had some left eye pain which is chronic and following BCC in the area.   Objective:    Vital signs in last 24 hours: Vitals:   12/18/17 0359 12/18/17 0801 12/18/17 1133 12/18/17 1624  BP: (!) 197/87 (!) 194/70 (!) 189/80 (!) 164/67  Pulse: 67 64 66 65  Resp: 18 18 18 18   Temp: 98.1 F (36.7 C) 98.2 F (36.8 C) 99 F (37.2 C) 98 F (36.7 C)  TempSrc: Oral Oral Oral Oral  SpO2: 94% 97% 97% 96%  Weight:      Height:       Alert and oriented. In no acute distress.  Decrease in visual acuity on left eye. Decreased motor strength (3-4/5) in Left lower extremity is noticed. Neurologic exam was normal otherwise.   Assessment/Plan:  1. Vertigo: Resolved. But has some dizziness intermittently.  Stroke work up negative so far for acute event. Echo with bubble study was negative so far. Ct Angio Chest/abd/pel For Dissection done yesterday 12/16/17 was negative for pulmonary emboli, aneurysmal dilation or dissection.   MRI HEAD WITHOUT CONTRAST today 12/18/17 showed Chronic ischemic microangiopathy with multiple old, small left hemisphere white matter infarcts. No acute abnormality.Neurology is on board.  -PT evaluation done -Probable D/C to SNF in 1-2 days  -As per neurology recommendation, start 75 mg of plavix in combination with Aspirin 81 mg daily for 3 weeks then Plavix alone 2. Decreased Visual acuity in last 2 weeks, chronic left eye pain: -Follow up with ophthalmology and [facility] after discharge  2.HLD:  - Crestor 20 mg daily  3.HTN: Increased BP today - Increase Lisinopril to 20 mg daily   Dispo: Anticipated discharge in approximately 1 day(s).   Dewayne Hatch, MD 12/18/2017, 7:49 PM Pager: @[email or web address] @

## 2017-12-18 NOTE — Progress Notes (Addendum)
STROKE TEAM PROGRESS NOTE   SUBJECTIVE (INTERVAL HISTORY) No family members present.  The patient still has left blurry vision and mild left arm and leg weakness. However, repeat MRI still no acute infarct. Pt no further complains. He will need follow up with ophthalmology and VVS after discharge.    OBJECTIVE Temp:  [97.6 F (36.4 C)-99 F (37.2 C)] 99 F (37.2 C) (07/01 1133) Pulse Rate:  [64-76] 66 (07/01 1133) Cardiac Rhythm: Heart block (07/01 0700) Resp:  [16-20] 18 (07/01 1133) BP: (188-197)/(70-99) 189/80 (07/01 1133) SpO2:  [94 %-99 %] 97 % (07/01 1133)  CBC:  Recent Labs  Lab 12/16/17 1335  WBC 5.5  NEUTROABS 3.9  HGB 13.5  HCT 41.2  MCV 98.1  PLT 258    Basic Metabolic Panel:  Recent Labs  Lab 12/17/17 0527 12/18/17 0713  NA 137 139  K 4.2 4.5  CL 107 105  CO2 26 27  GLUCOSE 94 102*  BUN 26* 30*  CREATININE 1.85* 1.73*  CALCIUM 8.3* 9.0    Lipid Panel:     Component Value Date/Time   CHOL 201 (H) 12/16/2017 1335   TRIG 240 (H) 12/16/2017 1335   HDL 31 (L) 12/16/2017 1335   CHOLHDL 6.5 12/16/2017 1335   VLDL 48 (H) 12/16/2017 1335   LDLCALC 122 (H) 12/16/2017 1335   HgbA1c:  Lab Results  Component Value Date   HGBA1C 5.7 (H) 12/16/2017   Urine Drug Screen:     Component Value Date/Time   LABOPIA NONE DETECTED 12/16/2017 1549   COCAINSCRNUR NONE DETECTED 12/16/2017 1549   LABBENZ NONE DETECTED 12/16/2017 1549   AMPHETMU NONE DETECTED 12/16/2017 1549   THCU NONE DETECTED 12/16/2017 1549   LABBARB (A) 12/16/2017 1549    Result not available. Reagent lot number recalled by manufacturer.    Alcohol Level No results found for: [facility]   IMAGING I have personally reviewed the radiological images below and agree with the radiology interpretations.  Ct Head Wo Contrast 12/16/2017 IMPRESSION:  1. Negative for bleed or other acute intracranial process.  2. Atrophy and nonspecific white matter changes.   Mri and  Mra Head Wo Contrast Mr Jodene Nam  Neck Wo Contrast 12/16/2017 IMPRESSION:  1. No acute intracranial abnormality identified.  2. Moderate chronic microvascular ischemic changes and parenchymal volume loss of the brain. Small chronic infarcts within the left inferior cerebellum, right thalamus, and left lentiform nucleus.  3. No large vessel occlusion or aneurysm.  4. Moderate 60% left proximal ICA stenosis. No additional segment of hemodynamically significant stenosis by NASCET criteria.    Ct Angio Chest/abd/pel For Dissection W And/or Wo Contrast 12/16/2017 IMPRESSION:  CTA of the chest: No evidence of aneurysmal dilatation or dissection. No pulmonary embolus is seen. Mild dependent changes in the lungs bilaterally. CT of the abdomen and pelvis: No evidence of aneurysmal dilatation or dissection is seen. Minimal free fluid within the pelvis which may be physiologic in nature.    Transthoracic Echocardiogram with bubble study 12/17/2017 Left ventricle: The cavity size was normal. Wall thickness was   increased in a pattern of mild LVH. Systolic function was normal.   The estimated ejection fraction was in the range of 55% to 60%.   Wall motion was normal; there were no regional wall motion   abnormalities. Doppler parameters are consistent with abnormal   left ventricular relaxation (grade 1 diastolic dysfunction). - Aortic valve: There was trivial regurgitation. - Ascending aorta: The ascending aorta was mildly dilated. - Pulmonary arteries:  Systolic pressure was mildly increased. PA   peak pressure: 33 mm Hg (S). Impressions: - Normal LV systolic function; mild LVH; mild diastolic   dysfunction; mildly dilated ascending aorta; trace TR with mild   pulmonary hypertension.  Mr Brain Limited Wo Contrast  Result Date: 12/18/2017 CLINICAL DATA:  Stroke follow-up EXAM: MRI HEAD WITHOUT CONTRAST TECHNIQUE: Multiplanar, multiecho pulse sequences of the brain and surrounding structures were obtained without intravenous  contrast. COMPARISON:  None. FINDINGS: BRAIN: There is no acute infarct, acute hemorrhage or mass effect. Multiple small left corona radiata and centrum semiovale infarcts. Early confluent hyperintense T2-weighted signal of the periventricular and deep white matter, most commonly due to chronic ischemic microangiopathy. The CSF spaces are normal for age, with no hydrocephalus. Susceptibility-sensitive sequences show no chronic microhemorrhage or superficial siderosis. VASCULAR: Major intracranial arterial and venous sinus flow voids are preserved. SKULL AND UPPER CERVICAL SPINE: The visualized skull base, calvarium, upper cervical spine and extracranial soft tissues are normal. SINUSES/ORBITS: No fluid levels or advanced mucosal thickening. No mastoid or middle ear effusion. The orbits are normal. IMPRESSION: Chronic ischemic microangiopathy with multiple old, small left hemisphere white matter infarcts. No acute abnormality. Electronically Signed   By: Ulyses Jarred M.D.   On: 12/18/2017 06:05    PHYSICAL EXAM Vitals:   12/17/17 2342 12/18/17 0359 12/18/17 0801 12/18/17 1133  BP: (!) 191/99 (!) 197/87 (!) 194/70 (!) 189/80  Pulse: 76 67 64 66  Resp: 18 18 18 18   Temp: 97.6 F (36.4 C) 98.1 F (36.7 C) 98.2 F (36.8 C) 99 F (37.2 C)  TempSrc: Oral Oral Oral Oral  SpO2: 99% 94% 97% 97%  Weight:      Height:        General - 84 year old male in no acute distress although he appears mildly pale. Heart - Regular rate and rhythm - no murmer appreciated Lungs - Clear but decreased breath sounds throughout.   Extremities - Distal pulses weak to absent. Trace to 1+ edema bilaterally. Skin - Warm and dry  Mental Status: Alert, oriented, thought content appropriate.  Speech fluent without evidence of aphasia.  Able to follow 3 step commands without difficulty. Cranial Nerves: II: Discs not visualized; Visual fields normal, left decrease visual acuity, pupils equal, round, reactive to  light. III,IV, VI: ptosis not present, extra-ocular motions intact bilaterally V,VII: smile symmetric, facial light touch sensation normal bilaterally VIII: hearing normal bilaterally, no nystagmus bilaterally IX,X: gag reflex present XI: bilateral shoulder shrug intact. XII: midline tongue extension Motor: RUE - 5/5    LUE - proximal 5/5, bicep 4/5 and distally 4+/5 RLE - 5/5    LLE -  3/5 proximal and 4/5 distal Tone and bulk:normal tone throughout; no atrophy noted Sensory: Light touch mildly decreased on the left side Cerebellar: normal finger-to-nose but performed very slowly bilaterally. Gait: not tested   ASSESSMENT/PLAN Zachary [name] is a 84 y.o. male with history of obesity, depression, irritable bowel syndrome, gastritis, basal cell cancer of the face - resected, hypertension, chronic pain disorder, chronic kidney disease, anxiety, and history of alcoholism presenting with nausea vomiting and dizziness. He did not receive IV t-PA due to minimal deficits.  Likely DWI-negative posterior circulation infarct - given vertigo, N/V, left hemiparesis  Resultant - left mild hemiparesis  CT head - Atrophy and nonspecific white matter changes.  MRI head x 2 - no acute abnormalities.  Multiple old infarcts.  MRA head - 60% left internal carotid artery bifurcation stenosis  2D  Echo -EF 55 to 60%.  No cardiac source of emboli identified.  LDL - 122  HgbA1c - 5.7  VTE prophylaxis -Lovenox  aspirin 81 mg daily prior to admission, now on aspirin 81 mg daily. Will recommend ASA 81mg  and plavix 75mg  daily for 3 weeks and then plavix alone.   Patient counseled to be compliant with his antithrombotic medications  Ongoing aggressive stroke risk factor management  Therapy recommendations:  SNF recommended  Disposition:  Pending  Left ICA stenosis  MRA head and neck showed left ICA 60% stenosis  Left visual acuity decreased 2 weeks ago - ?? BRAO  Asymptomatic for current  admission  Recommend to arrange for VVS as well as ophthalmology follow up as outpt  Hypertension . Blood pressure on the high end . Resume home meds with lasix and lisinopril . Increase lisinopril to 20mg  daily . Gradually normalize BP in 3-5 days . Long-term BP goal normotensive  Hyperlipidemia  Lipid lowering medication PTA: none  LDL 122 , goal < 70  Current lipid lowering medication: Now on Crestor 20 mg daily   Continue statin at discharge  Other Stroke Risk Factors  Advanced age  Former cigarette smoker - quit more than 30 years ago.  Obesity, Body mass index is 32.26 kg/m., recommend weight loss, diet and exercise as appropriate   Hx stroke/TIA in imaging  Other Active Problems  History of chronic pain -on methadone  CKD with Cre 1.85->1.73  S/p left hip placement   Hospital day # 2  Neurology will sign off. Please call with questions. Pt will follow up with stroke clinic NP at [facility] in about 4 weeks. Thanks for the consult.    Rosalin Hawking, MD PhD Stroke Neurology 12/18/2017 1:23 PM      To contact Stroke Continuity provider, please refer to [email or web address] After hours, contact General Neurology

## 2017-12-18 NOTE — Progress Notes (Signed)
Physical Therapy Treatment/Vestibular Assessment Patient Details Name: FLOYDE DINGLEY MRN: [id number] DOB: [birth year] Today's Date: 12/18/2017    History of Present Illness Pt is an 84 y.o. male admitted 12/16/17 with c/o dizziness, falls, nausea/vomitting. MRI shows no acute intracranial abnormality; small chronic infarcts within the left inferior cerebellum, right thalamus, and left lentiform nucleus. PMH includes CKD III, HTN, L knee OA, chronic pain disorder, skin CA w/ surgery to L eye.     PT Comments    Pt was able to get up and move around the room with a RW, self limiting due to fatigue and dizziness when up.  He has some interesting, likely multifactorial vestibular and visual issues.  His left eye is not working well and he does have some note able left beating nystagmus with left gaze.   Further assessment, compensation, and treatment to follow.  He remains appropriate for SNF level rehab at discharge.    Follow Up Recommendations  SNF;Supervision for mobility/OOB     Equipment Recommendations  Rolling walker with 5" wheels    Recommendations for Other Services   NA     Precautions / Restrictions Precautions Precautions: Fall    Mobility  Bed Mobility Overal bed mobility: Needs Assistance Bed Mobility: Supine to Sit     Supine to sit: Min assist Sit to supine: Supervision   General bed mobility comments: Min hand held assist to pull to sitting EOB.  Extra time needed due to symptom provocation.   Transfers Overall transfer level: Needs assistance Equipment used: Rolling walker (2 wheeled) Transfers: Sit to/from Stand Sit to Stand: +2 safety/equipment;Min assist         General transfer comment: Min assist at trunk to power up to standing, verbal cues for safe hand placement and RW use during transitions.   Ambulation/Gait Ambulation/Gait assistance: Min assist;+2 safety/equipment Gait Distance (Feet): 20 Feet Assistive device: Rolling walker (2  wheeled) Gait Pattern/deviations: Step-through pattern;Shuffle;Trunk flexed     General Gait Details: Pt self limiting ambulation to doorway of room with RW, rigid trunk, flexed shuffling posture, cues to stand before walking for a few extra seconds and to stay inside of RW during gait.           Balance Overall balance assessment: Needs assistance Sitting-balance support: No upper extremity supported;Feet supported Sitting balance-Leahy Scale: Fair Sitting balance - Comments: close supervision EOB, not specifically challenged   Standing balance support: Bilateral upper extremity supported Standing balance-Leahy Scale: Poor Standing balance comment: needs external support in standing.          12/18/17 1057  Vestibular Assessment  General Observation L eye h/o skin CA surgery 6 years ago.  Pt reporting new L eye pain, blurriness, uses eye drops at baseline, new diploplia, no tinnitus, no fullness, no recent URI, no recent head trauma, no recent change in meds, wears glasses to see, no obvious hearing deficits.  Reports ~2 week worsening of vision/diploplia, imbalance, N/V.   Symptom Behavior  Type of Dizziness Spinning  Frequency of Dizziness constant at first, now seems more intermittent  Duration of Dizziness when upright  Aggravating Factors Activity in general  Relieving Factors Lying supine;Closing eyes (closing left eye specifically)  Occulomotor Exam  Occulomotor Alignment Abnormal (disconjugate)  Spontaneous Absent  Gaze-induced Left beating nystagmus with L gaze  Smooth Pursuits Saccades  Comment pt blinking frequently and keeping left eye closed as it helped his vision. Test of skew was seemingly negative.  Vestibulo-Occular Reflex  VOR 1 Head Only (x  1 viewing) HIT with startle, difficult to see true result due to guarding and inability to stop blinking and focus gaze, pt has difficulty keeping focus for any x1 seated horizontal movements, vertical not tested   Auditory  Comments grossly equal bil per fringer rub test  Positional Testing  Dix-Hallpike  (NT due to no indication that we needed to)                         Cognition Arousal/Alertness: Awake/alert Behavior During Therapy: [facility] for tasks assessed/performed Overall Cognitive Status: (Not specifically tested) Area of Impairment: Following commands;Safety/judgement;Problem solving;Memory                     Memory: Decreased short-term memory Following Commands: Follows multi-step commands inconsistently Safety/Judgement: Decreased awareness of deficits;Decreased awareness of safety   Problem Solving: Requires verbal cues General Comments: Pt with noted decreased safety awareness during session. He is alert and oriented x4. Decreased understanding of his deficits.       Exercises      General Comments General comments (skin integrity, edema, etc.): Dizziness in standing with pt reporting "it is hard to say how it is" but not room or himself spinning.       Pertinent Vitals/Pain Pain Assessment: 0-10 Pain Score: 5  Pain Location: headache posterior Pain Descriptors / Indicators: Headache Pain Intervention(s): Limited activity within patient's tolerance;Monitored during session;Repositioned    Home Living Family/patient expects to be discharged to:: Assisted living             Home Equipment: Kasandra Knudsen - single point Additional Comments: From [facility]    Prior Function Level of Independence: Needs assistance  Gait / Transfers Assistance Needed: Using cane for mobility.  ADL's / Homemaking Assistance Needed: Independent with basic ADL. Sometimes uses a shower with a seat but other times does wash-ups. Meals either delivered to room or pt goes to pick them up at the dining room. Assistance with meals.      PT Goals (current goals can now be found in the care plan section) Acute Rehab PT Goals Patient Stated Goal: to read the newspaper  again Progress towards PT goals: Progressing toward goals    Frequency    Min 2X/week      PT Plan Current plan remains appropriate       AM-PAC PT "6 Clicks" Daily Activity  Outcome Measure  Difficulty turning over in bed (including adjusting bedclothes, sheets and blankets)?: None Difficulty moving from lying on back to sitting on the side of the bed? : A Little Difficulty sitting down on and standing up from a chair with arms (e.g., wheelchair, bedside commode, etc,.)?: A Little Help needed moving to and from a bed to chair (including a wheelchair)?: A Little Help needed walking in hospital room?: A Little Help needed climbing 3-5 steps with a railing? : A Lot 6 Click Score: 18    End of Session Equipment Utilized During Treatment: Gait belt Activity Tolerance: Patient limited by fatigue Patient left: in chair;with call bell/phone within reach Nurse Communication: Mobility status PT Visit Diagnosis: Other abnormalities of gait and mobility (R26.89);Muscle weakness (generalized) (M62.81)     Time: 1287-8676 PT Time Calculation (min) (ACUTE ONLY): 31 min  Charges:  $Therapeutic Activity: 23-37 mins          Edgar Corrigan B. [place], [place], DPT [phone]            12/18/2017, 11:10 AM

## 2017-12-18 NOTE — Progress Notes (Signed)
Internal Medicine Attending  Date: 12/18/2017  Patient name: Zachary [name] Medical record number: [id number] Date of birth: 1929/09/16 Age: 84 y.o. Gender: male  I saw and evaluated the patient. I reviewed the resident's note by Dr. Myrtie [name] and I agree with the resident's findings and plans as documented in her progress note.  [name] noted some improvement in his dizziness when questioned on rounds this morning. Although the repeat MRI was negative, he continues to have weakness in the left upper and especially left lower extremity. Neurology feels he has a likely DWI-negative posterior circulation infarct given his vertigo, nausea and vomiting, and left sided weaknesst. I think this is a reasonable explanation for his symptoms and physical exam and he has been started on dual antiplatelet therapy, a high intensity statin, and reinstitution of antihypertensives now that he is out of the acute CVA timeframe. He is continuing with physical therapy. The plan is for transfer to a skilled nursing facility once a bed becomes available.

## 2017-12-18 NOTE — Evaluation (Signed)
Occupational Therapy Evaluation Patient Details Name: Zachary [name] MRN: [id number] DOB: 1929-10-05 Today's Date: 12/18/2017    History of Present Illness Pt is an 84 y.o. male admitted 12/16/17 with c/o dizziness, falls, nausea/vomitting. MRI shows no acute intracranial abnormality; small chronic infarcts within the left inferior cerebellum, right thalamus, and left lentiform nucleus. PMH includes CKD III, HTN, L knee OA, chronic pain disorder, skin CA w/ surgery to L eye.    Clinical Impression   PTA, pt was living at ALF and independent with cane for basic ADL. He has meals provided and medication management assistance. Pt presents to OT evaluation with significant dizziness, double vision intermittently, headache, decreased activity tolerance for ADL, and generalized weakness impacting his ability to participate in ADL at [facility]. He requires min assist for UB and LB dressing and bathing, heavy min assist for simulated toilet transfers, and up to min guard assist seated at EOB to sustain balance during static and dynamic ADL tasks. Pt would benefit from continued OT services while admitted to improve independence and safety with ADL and functional mobility. Recommend PT vestibular consult as well and note that this has been ordered. Recommend short-term SNF level rehabilitation post-acute D/C.     Follow Up Recommendations  SNF;Supervision/Assistance - 24 hour    Equipment Recommendations  Other (comment)(defer to next venue of care)    Recommendations for Other Services       Precautions / Restrictions Precautions Precautions: Fall      Mobility Bed Mobility Overal bed mobility: Needs Assistance Bed Mobility: Supine to Sit;Sit to Supine     Supine to sit: Min assist Sit to supine: Supervision   General bed mobility comments: Increased time and effort with min assist to raise trunk from [facility].   Transfers Overall transfer level: Needs assistance Equipment used: Rolling walker (2  wheeled) Transfers: Sit to/from Stand Sit to Stand: Min assist         General transfer comment: Heavy min assist and requiring momentum to stand. Cues for correct hand placement.     Balance Overall balance assessment: Needs assistance Sitting-balance support: No upper extremity supported;Feet supported Sitting balance-Leahy Scale: Fair     Standing balance support: Bilateral upper extremity supported;During functional activity Standing balance-Leahy Scale: Poor Standing balance comment: Heavy reliance on BUE support                           ADL either performed or assessed with clinical judgement   ADL Overall ADL's : Needs assistance/impaired Eating/Feeding: Set up;Sitting Eating/Feeding Details (indicate cue type and reason): with supervision to min guard seated at EOB for balance Grooming: Set up;Sitting;Wash/dry face Grooming Details (indicate cue type and reason): with supervision to min guard for balance at EOB Upper Body Bathing: Sitting;Minimal assistance   Lower Body Bathing: Minimal assistance;Sit to/from stand   Upper Body Dressing : Minimal assistance;Sitting   Lower Body Dressing: Minimal assistance;Sit to/from stand   Toilet Transfer: Minimal Insurance claims handler Details (indicate cue type and reason): Assist for stability and powering up to standing. Pt highly unstable. He was able to take a few side steps at EOB.  Toileting- Clothing Manipulation and Hygiene: Moderate assistance;Sit to/from stand       Functional mobility during ADLs: Minimal assistance;Rolling walker General ADL Comments: Pt requiring increased time and demonstrating decreased activity tolerance. Significant dizziness during session today and poor ability to verbalize sensations. Closing L eye (recent surgery) and reporting double vision with  both eyes open.      Vision Baseline Vision/History: (recent L eye surgery - cancer) Patient Visual Report:  Diplopia Vision Assessment?: Yes Eye Alignment: Within Functional Limits Ocular Range of Motion: Within Functional Limits Alignment/Gaze Preference: Within Defined Limits Tracking/Visual Pursuits: (able to track horizontally ) Diplopia Assessment: Disappears with one eye closed;Present all the time/all directions(disappears with L eye closed) Additional Comments: Pt reporting double vision "with the initial look" but then this resolves when he blinks. Pt with recent L eye surgery.      Perception     Praxis      Pertinent Vitals/Pain Pain Assessment: 0-10 Pain Score: 5  Pain Location: Headache - alternating from frontal and posterior Pain Descriptors / Indicators: Headache     Hand Dominance     Extremity/Trunk Assessment Upper Extremity Assessment Upper Extremity Assessment: Generalized weakness   Lower Extremity Assessment Lower Extremity Assessment: Generalized weakness       Communication Communication Communication: No difficulties   Cognition Arousal/Alertness: Awake/alert Behavior During Therapy: WFL for tasks assessed/performed Overall Cognitive Status: Impaired/Different from baseline Area of Impairment: Following commands;Safety/judgement;Problem solving;Memory                     Memory: Decreased short-term memory Following Commands: Follows multi-step commands inconsistently Safety/Judgement: Decreased awareness of deficits;Decreased awareness of safety   Problem Solving: Requires verbal cues General Comments: Pt with noted decreased safety awareness during session. He is alert and oriented x4. Decreased understanding of his deficits.    General Comments  Dizziness in standing with pt reporting "it is hard to say how it is" but not room or himself spinning.     Exercises     Shoulder Instructions      Home Living Family/patient expects to be discharged to:: Assisted living                             Home Equipment: Kasandra Knudsen -  single point   Additional Comments: [facility]      Prior Functioning/Environment Level of Independence: Needs assistance  Gait / Transfers Assistance Needed: Using cane for mobility.  ADL's / Homemaking Assistance Needed: Independent with basic ADL. Sometimes uses a shower with a seat but other times does wash-ups. Meals either delivered to room or pt goes to pick them up at the dining room. Assistance with meals.             OT Problem List: Decreased strength;Decreased range of motion;Decreased activity tolerance;Impaired balance (sitting and/or standing);Decreased safety awareness;Decreased knowledge of use of DME or AE;Decreased knowledge of precautions;Decreased cognition;Impaired vision/perception;Pain      OT Treatment/Interventions: Self-care/ADL training;Therapeutic exercise;Energy conservation;DME and/or AE instruction;Therapeutic activities;Patient/family education;Balance training;Cognitive remediation/compensation;Visual/perceptual remediation/compensation    OT Goals(Current goals can be found in the care plan section) Acute Rehab OT Goals Patient Stated Goal: drink coffee and watch the news OT Goal Formulation: With patient Time For Goal Achievement: 01/01/18 Potential to Achieve Goals: Good ADL Goals Pt Will Perform Grooming: with modified independence;standing Pt Will Perform Upper Body Dressing: with modified independence;sitting Pt Will Perform Lower Body Dressing: with modified independence;sit to/from stand Pt Will Transfer to Toilet: with modified independence;ambulating;regular height toilet Pt Will Perform Toileting - Clothing Manipulation and hygiene: with modified independence;sit to/from stand  OT Frequency: Min 2X/week   Barriers to D/C:            Co-evaluation  AM-PAC PT "6 Clicks" Daily Activity     Outcome Measure Help from another person eating meals?: A Little Help from another person taking care of  personal grooming?: A Little Help from another person toileting, which includes using toliet, bedpan, or urinal?: A Lot Help from another person bathing (including washing, rinsing, drying)?: A Little Help from another person to put on and taking off regular upper body clothing?: A Little Help from another person to put on and taking off regular lower body clothing?: A Little 6 Click Score: 17   End of Session Equipment Utilized During Treatment: Gait belt;Rolling walker Nurse Communication: Mobility status;Other (comment)(OT provided more coffee for pt)  Activity Tolerance: Patient tolerated treatment well Patient left: in bed;with call bell/phone within reach;with bed alarm set  OT Visit Diagnosis: Other abnormalities of gait and mobility (R26.89);Dizziness and giddiness (R42)                Time: 3779-3968 OT Time Calculation (min): 35 min Charges:  OT General Charges $OT Visit: 1 Visit OT Evaluation $OT Eval Moderate Complexity: 1 Mod OT Treatments $Self Care/Home Management : 8-22 mins G-Codes:     Norman Herrlich, MS OTR/L  Pager: Zachary [name] 12/18/2017, 8:51 AM

## 2017-12-18 NOTE — Clinical Social Work Note (Signed)
Clinical Social Work Assessment  Patient Details  Name: Zachary [name] MRN: [id number] Date of Birth: June 04, 1930  Date of referral:  12/18/17               Reason for consult:  Facility Placement                Permission sought to share information with:  Chartered certified accountant granted to share information::  Yes, Verbal Permission Granted  Name::        Agency::  [facility], SNF  Relationship::     Contact Information:     Housing/Transportation Living arrangements for the past 2 months:  [place] of Information:  Patient Patient Interpreter Needed:  None Criminal Activity/Legal Involvement Pertinent to Current Situation/Hospitalization:  No - Comment as needed Significant Relationships:  Siblings Lives with:  Self, Facility Resident Do you feel safe going back to the place where you live?  Yes Need for family participation in patient care:  No (Coment)  Care giving concerns:  Patient from [facility] but is currently recommended for SNF placement.   Social Worker assessment / plan:  CSW met with patient and discussed recommendation for SNF. CSW discussed options with the patient, and will follow up. CSW to contact [facility] to discuss his care, as well as fax out SNF referral. CSW to follow.  Employment status:  Retired Forensic scientist:  Medicare PT Recommendations:  [place] / Referral to community resources:  [place]  Patient/Family's Response to care:  Patient would prefer to be able to return back to [facility], if possible, but will look into SNF if they are unable to care for him presently.  Patient/Family's Understanding of and Emotional Response to Diagnosis, Current Treatment, and Prognosis:  Patient acknowledged that he needs more help than his baseline, but that he is really hoping that they could set him up with extra help at [facility] so that he can  return home. Patient discussed how that's where he's comfortable and he'd really just like to be able to return there if he can. Patient confirmed that he will do what is necessary, and he will go to SNF if they are unable to provide extra help for him.  Emotional Assessment Appearance:  Appears stated age Attitude/Demeanor/Rapport:  Engaged Affect (typically observed):  Pleasant Orientation:  Oriented to Self, Oriented to Place, Oriented to  Time, Oriented to Situation Alcohol / Substance use:  Not Applicable Psych involvement (Current and /or in the community):  No (Comment)  Discharge Needs  Concerns to be addressed:  Care Coordination Readmission within the last 30 days:  No Current discharge risk:  Dependent with Mobility Barriers to Discharge:  Continued Medical Work up, Bee, [place] 12/18/2017, 12:37 PM

## 2017-12-18 NOTE — NC FL2 (Signed)
[place] LEVEL OF CARE SCREENING TOOL     IDENTIFICATION  Patient Name: Zachary [name] Birthdate: [birth year] Sex: male Admission Date (Current Location): 12/16/2017  [facility], [facility], [facility] and [place] Number:  Herbalist and Address:  [facility]. [facility], [place] [address] [place], [place] [address]      Provider Number: [id number]  Attending Physician Name and Address:  Aldine Contes, MD  Relative Name and Phone Number:       Current Level of Care: Hospital Recommended Level of Care: [place] Prior Approval Number:    Date Approved/Denied:   PASRR Number: [id number]  Discharge Plan: SNF    Current Diagnoses: Patient Active Problem List   Diagnosis Date Noted  . Cerebral thrombosis with cerebral infarction 12/17/2017  . Nausea and vomiting   . Hyperlipidemia   . Vertigo 12/16/2017  . Dry eyes 05/01/2017  . Cough 04/28/2017  . Senile purpura ([place]) 07/18/2016  . Aortic atherosclerosis ([place]) 02/26/2015  . Chronic kidney disease, stage 3 ([place]) 11/08/2013  . Allergic rhinitis 09/13/2013  . Osteoarthritis of left knee 03/07/2013  . Swelling of lower extremity 12/07/2012  . Gastritis 07/06/2012  . Obesity (BMI 30.0-34.9) 07/06/2012  . Squamous cell skin cancer 07/06/2012  . Healthcare maintenance 07/06/2012  . Constipation due to pain medication 06/03/2011  . Arthritis of hip 05/21/2010  . Dizziness 10/30/2006  . Anxiety 06/22/2006  . Major depressive disorder, recurrent severe without psychotic features ([place]) 06/22/2006  . Chronic pain disorder 06/22/2006  . Essential hypertension 06/22/2006  . Gastroesophageal reflux disease with stricture 06/22/2006  . Diverticulosis of colon 06/22/2006  . Benign prostatic hypertrophy with urinary obstruction 06/22/2006  . Facial basal cell cancer 06/22/2006    Orientation RESPIRATION BLADDER Height & Weight     Self, Time, Situation, Place  Normal Continent Weight: 206 lb (93.4  kg) Height:  5\' 7"  (170.2 cm)  BEHAVIORAL SYMPTOMS/MOOD NEUROLOGICAL BOWEL NUTRITION STATUS      Continent Diet(heart healthy)  AMBULATORY STATUS COMMUNICATION OF NEEDS Skin   Limited Assist Verbally Normal                       Personal Care Assistance Level of Assistance  Bathing, Feeding, Dressing Bathing Assistance: Limited assistance Feeding assistance: Independent Dressing Assistance: Limited assistance     Functional Limitations Info  Sight, Hearing, Speech Sight Info: Adequate Hearing Info: Adequate Speech Info: Adequate    SPECIAL CARE FACTORS FREQUENCY  PT (By licensed PT), OT (By licensed OT)     PT Frequency: 5x/wk OT Frequency: 5x/wk            Contractures Contractures Info: Not present    Additional Factors Info  Code Status, Allergies, Psychotropic Code Status Info: Full Allergies Info: Clindamycin/lincomycin, Lincomycin Hcl Psychotropic Info: Remeron 15mg  daily at bed; Risperdal 0.25mg  daily at bed; Zoloft 150mg  daily; Ativan 0.5mg  3x/day         Current Medications (12/18/2017):  This is the current hospital active medication list Current Facility-Administered Medications  Medication Dose Route Frequency Provider Last Rate Last Dose  . aspirin chewable tablet 81 mg  81 mg Oral Daily Rosalin Hawking, MD   81 mg at 12/17/17 0901  . clopidogrel (PLAVIX) tablet 75 mg  75 mg Oral Daily Rosalin Hawking, MD      . enoxaparin (LOVENOX) injection 30 mg  30 mg Subcutaneous Q24H Ledell Noss, MD   30 mg at 12/17/17 0901  . feeding supplement (ENSURE  ENLIVE) (ENSURE ENLIVE) liquid 237 mL  237 mL Oral BID BM Ledell Noss, MD   237 mL at 12/17/17 1445  . finasteride (PROSCAR) tablet 5 mg  5 mg Oral Daily Ledell Noss, MD   5 mg at 12/17/17 0901  . fluticasone (FLONASE) 50 MCG/ACT nasal spray 1 spray  1 spray Each Nare Daily Ledell Noss, MD   1 spray at 12/17/17 1000  . lisinopril (PRINIVIL,ZESTRIL) tablet 5 mg  5 mg Oral Daily Lorella Nimrod, MD   5 mg at 12/17/17 1519  .  LORazepam (ATIVAN) injection 0.5 mg  0.5 mg Intravenous TID Ledell Noss, MD   0.5 mg at 12/17/17 2253  . methadone (DOLOPHINE) tablet 5 mg  5 mg Oral Q8H Ledell Noss, MD   5 mg at 12/18/17 0636  . mirtazapine (REMERON) tablet 15 mg  15 mg Oral QHS Ledell Noss, MD   15 mg at 12/17/17 2253  . pantoprazole (PROTONIX) EC tablet 40 mg  40 mg Oral Daily Ledell Noss, MD   40 mg at 12/17/17 0901  . polyethylene glycol (MIRALAX / GLYCOLAX) packet 17 g  17 g Oral Daily Ledell Noss, MD   17 g at 12/17/17 0902  . polyvinyl alcohol (LIQUIFILM TEARS) 1.4 % ophthalmic solution 1 drop  1 drop Both Eyes TID Ledell Noss, MD   1 drop at 12/17/17 2253  . risperiDONE (RISPERDAL) tablet 0.25 mg  0.25 mg Oral QHS Ledell Noss, MD   0.25 mg at 12/17/17 2253  . rosuvastatin (CRESTOR) tablet 20 mg  20 mg Oral q1800 Katherine Roan, MD   20 mg at 12/17/17 1713  . senna (SENOKOT) tablet 8.6 mg  1 tablet Oral QHS Ledell Noss, MD   8.6 mg at 12/17/17 2253  . sertraline (ZOLOFT) tablet 150 mg  150 mg Oral Daily Ledell Noss, MD   150 mg at 12/17/17 0901  . tamsulosin (FLOMAX) capsule 0.4 mg  0.4 mg Oral Daily Ledell Noss, MD   0.4 mg at 12/17/17 0901     Discharge Medications: Please see discharge summary for a list of discharge medications.  Relevant Imaging Results:  Relevant Lab Results:   Additional Information SS#: [id number]  Geralynn Ochs, LCSW

## 2017-12-19 ENCOUNTER — Inpatient Hospital Stay (HOSPITAL_COMMUNITY): Payer: Medicare Other

## 2017-12-19 ENCOUNTER — Telehealth: Payer: Self-pay

## 2017-12-19 DIAGNOSIS — Z7902 Long term (current) use of antithrombotics/antiplatelets: Secondary | ICD-10-CM

## 2017-12-19 DIAGNOSIS — R109 Unspecified abdominal pain: Secondary | ICD-10-CM

## 2017-12-19 DIAGNOSIS — H5500 Unspecified nystagmus: Secondary | ICD-10-CM

## 2017-12-19 DIAGNOSIS — R1013 Epigastric pain: Secondary | ICD-10-CM

## 2017-12-19 DIAGNOSIS — Z96642 Presence of left artificial hip joint: Secondary | ICD-10-CM

## 2017-12-19 DIAGNOSIS — G8929 Other chronic pain: Secondary | ICD-10-CM

## 2017-12-19 DIAGNOSIS — Z881 Allergy status to other antibiotic agents status: Secondary | ICD-10-CM

## 2017-12-19 DIAGNOSIS — M25552 Pain in left hip: Secondary | ICD-10-CM

## 2017-12-19 MED ORDER — ROSUVASTATIN CALCIUM 20 MG PO TABS: 20.0000 mg | ORAL_TABLET | Freq: Every day | ORAL | 3 refills | Status: DC

## 2017-12-19 MED ORDER — CLOPIDOGREL BISULFATE 75 MG PO TABS: 75.0000 mg | ORAL_TABLET | Freq: Every day | ORAL | 3 refills | Status: DC

## 2017-12-19 MED ORDER — LISINOPRIL 20 MG PO TABS: 20.0000 mg | ORAL_TABLET | Freq: Every day | ORAL | 3 refills | Status: DC

## 2017-12-19 NOTE — Discharge Summary (Signed)
Name: Zachary [name] MRN: [id number] DOB: 07-Sep-1929 84 y.o. PCP: Jean Rosenthal, MD  Date of Admission: 12/16/2017 12:29 PM Date of Discharge: 12/19/2017 Attending Physician: Aldine Contes, MD  Discharge Diagnosis:  1. DWI-negative posterior circulation infarct     Discharge Medications:     Disposition and follow-up:   ZacharySkylor E [name] was discharged from [facility] in Stable condition.  At the hospital follow up visit please address:  1.  Vertiigo  2.  Labs / imaging needed at time of follow-up: none  3.  Pending labs/ test needing follow-up: none Follow-up Appointments:  Contact information for follow-up providers    [facility]. Schedule an appointment as soon as possible for a visit in 4 week(s).   Specialty:  Neurology Contact information: [address] [place] [phone]           Contact information for after-discharge care    Destination    [facility] .   Service:  Skilled Nursing Contact information: [place] [facility] Course by problem list: 1. Vertigo + nystagmus  Zachary [name] is an 84 y/o male with HTN, CKD3, GERD, chronic pain, BCC of the face, anxiety and BPH presented with vertigo and dizziness. Physical exam at hospital was positive for nystagmus and decrease motor strength at left side particularly left lower extremities. Ct head Wo Contrast on  12/16/2017 was negative for bleed or other acute intracranial process and just showed trophy and nonspecific white matter changes.  Head and neck MRI and MRA showed NO acute intracranial abnormality but Small chronic infarcts within the left inferior cerebellum, right thalamus, and left lentiform nucleus and moderate chronic microvascular ischemic changes and parenchymal volume loss of the brain. No large vessel occlusion  or aneurysm.  Moderate 60% left proximal ICA stenosis.  Vertigo and nystagmus improved after a day and second MRI the day before discharge did not shown acute ischemic event. Patient was treated with dual anti plt therapy, statin. Patient also complains of some left hip pain at the area of previous hip replacement surgery. X-ray did not reveal any abnormality.    Other Pertinent Labs, Studies, and Procedures:  Transthoracic Echocardiogram with bubble study (12/17/2017): with estimated EF of 55 to 60%. Normal LV systolic function; mild LVH; mild diastolic dysfunction; mildly dilated ascending aorta; trace TR with mild pulmonary hypertension.  X-ray Hip / Left: (12/19/2017) FINDINGS: There is a prosthetic left hip joint. Radiographic positioning of the prosthetic components is good. The interface with the native bone is normal. No acute fracture of the visualized portions of the femur is observed. The left hemipelvis appears intact as well. IMPRESSION: There is no acute abnormality of the prosthetic left hip joint nor of the native bone   LDL:122 (non fasting) HDl:31 HbA1c: 5.7 Discharge Vitals:   BP (!) 173/75 (BP Location: Right Arm)   Pulse 69   Temp 98.2 F (36.8 C) (Oral)   Resp 20   Ht 5\' 7"  (1.702 m)   Wt 206 lb (93.4 kg)   SpO2 94%   BMI 32.26 kg/m     Discharge Instructions:   1)Take ASA 81 mg daily + Plavix 75 mg daily for 3 weeks, then stop ASA and continue Plavix.   2)Take Crestor 20 mg daily.   3)Take Lisinopril 20 mg Daily and  follow upp with your PCP to adjust the dose if required.   4)Follow up with ophthalmologist for your decreased vision and recommend to arrenge for VVS   Discharge Instructions    Ambulatory referral to Neurology   Complete by:  As directed    Follow up with stroke clinic NP (Jessica Vanschaick or Cecille Rubin, if both not available, consider Zachery Dauer, or [place]) at [facility] in about 4 weeks. Thanks.      Signed: Dewayne Hatch, MD 12/19/2017, 2:49 PM   Pager: @[phone] @

## 2017-12-19 NOTE — Discharge Summary (Addendum)
Name: Zachary [name] MRN: [id number] DOB: 1929-12-19 84 y.o. PCP: Jean Rosenthal, MD  Date of Admission: 12/16/2017 12:29 PM Date of Discharge: 12-19-2017 Attending Physician: Aldine Contes, MD  Discharge Diagnosis: 1. 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   artificial tears Oint ophthalmic ointment Use as directed in left eye at bedtime   aspirin 81 MG EC tablet Take 1 tablet (81 mg total) by mouth daily. Swallow whole.   carboxymethylcellulose 0.5 % Soln Commonly known as:  REFRESH PLUS Place 1 drop into both eyes 3 (three) times daily.   clopidogrel 75 MG tablet Commonly known as:  PLAVIX Take 1 tablet (75 mg total) by mouth daily. Start taking on:  12/20/2017   diclofenac sodium 1 % Gel Commonly known as:  VOLTAREN Apply 4 g topically 4 (four) times daily as needed.   feeding supplement Liqd Take 237 mLs by mouth 3 (three) times daily with meals.   finasteride 5 MG tablet Commonly known as:  PROSCAR Take 1 tablet (5 mg total) by mouth daily.   fluticasone 50 MCG/ACT nasal spray Commonly known as:  FLONASE Place 2 sprays into both nostrils daily.   furosemide 40 MG tablet Commonly known as:  LASIX Take 1 tablet (40 mg total) by mouth daily.   lisinopril 20 MG tablet Commonly known as:  PRINIVIL,ZESTRIL Take 1 tablet (20 mg total) by mouth daily. Start taking on:  12/20/2017 What changed:    medication strength  how much to take   LORazepam 0.5 MG tablet Commonly known as:  ATIVAN Take 1 tablet (0.5 mg total) every 12 (twelve) hours by mouth. In applesauce,  dosing to be supervised by caregiver What changed:    when to take this  additional instructions   methadone 5 MG tablet Commonly known as:  DOLOPHINE Take 1 tablet (5 mg total) by mouth every 8 (eight) hours. Rx 3 of 3.   mirtazapine 15 MG tablet Commonly known as:  REMERON Take 15 mg by mouth at bedtime.   omeprazole 20 MG capsule Commonly known as:  PRILOSEC Take 1 capsule (20 mg total) by mouth daily.   polyethylene glycol powder powder Commonly known as:  GLYCOLAX/MIRALAX Take 17 g by mouth daily.   risperiDONE 0.25 MG tablet Commonly known as:  RISPERDAL Take 0.25 mg by mouth 2 (two) times daily.   rosuvastatin 20 MG tablet Commonly known as:  CRESTOR Take 1 tablet (20 mg total) by mouth daily at 6 PM.   senna 8.6 MG tablet Commonly known as:  SENOKOT Take 3 tablets by mouth at bedtime.   sertraline 100 MG tablet Commonly known as:  ZOLOFT Take 150 mg by mouth daily.   Simethicone 125 MG Caps Take 1 capsule (125 mg total) by mouth 4 (four) times daily as needed.   TAB-A-VITE Tabs Take 1 tablet by mouth daily.   tamsulosin 0.4 MG Caps capsule Commonly known as:  FLOMAX Take 1 capsule (0.4 mg total) by mouth daily.   traMADol 50 MG tablet Commonly known as:  Veatrice Bourbon  Take 1 tablet (50 mg total) by mouth daily as needed for moderate pain. What changed:  when to take this       Disposition and follow-up:   Zachary [name] was discharged from [facility] in Good condition.  At the hospital follow up visit please address:  1.  Vertigo - Discuss resolution of symptoms. Ensure that he is taking both aspirin and plavix, after 3 months ( around October 2019) consider stopping the aspirin and continuing the plavix indefinitely.   2.  Labs / imaging needed at time of follow-up: none   3.  Pending labs/ test needing follow-up: none   Follow-up Appointments:  Contact information for follow-up providers    [facility].  Schedule an appointment as soon as possible for a visit in 4 week(s).   Specialty:  Neurology Why:  Freda Jackson information: [address] [place]. Go on 12/26/2017.   Why:  You have an appointment scheduled for 9:15 AM, please call and reschedule this if it doesnt work for your schedule  Contact information: [address] [place] [phone]           Contact information for after-discharge care    Destination    [facility] .   Service:  Skilled Nursing Contact information: [place] [facility] Course by problem list: Vertigo with nystagmus  Zachary [name] is an 84 year old man with PMH CKD 3, HTN, GERD, BPH who presented with dizziness at rest and was found to have a nystagmus and generalized weakness. MRI and MRA head and neck showed moderate chronic microvascular ischemic changesand small chronic infarcts within the left inferior cerebellum, right thalamus, and left lentiform nucleus but no cute abnormality or large vessel occlusion. He was found to have 60% stenosis of the left ICA. Neurology was consulted. Echo revealed EF 55-60%, no wall motion abnormalities, grade 1 diastolic dysfunction, mild dilation of the ascending aorta. Lipid testing showed ldl 122, HDL 31, Tag 240 ( non fasting). Hemoglobin A1c 5.7. The vertigo and nystagmus the day following admission. He was discharged with aspirin and plavix, crestor 20 mg daily, with plans to discontinue the aspirin after 3 weeks. His BP remained elevated on home lisinopril 5 mg so this was increased to 20 mg daily. PT evaluated, he was able to get up and move around the room but this was self limited by fatigue and dizzines, they recommended SNF rehabilitation.  Repeat MRI on the day prior to discharge again did not  reveal an acute ischemic event. Despite unrevealing imaging, his symptoms were felt to be secondary to a posterior circulation stroke. He had arrangements made for neurology and internal medicine clinic follow up.   He described pain of the left hip after fall, an xray was obtained and did not reveal acute fracture or loosening of the prosthesis.   Discharge Vitals:   BP (!) 175/72 (BP Location: Left Arm)   Pulse 66   Temp 98.4 F (36.9 C) (Oral)   Resp 16   Ht 5\' 7"  (1.702 m)   Wt 206 lb (93.4 kg)   SpO2 93%   BMI 32.26 kg/m   Pertinent Labs, Studies, and Procedures:   MRI and MRA  1. No acute intracranial abnormality identified. 2. Moderate chronic microvascular ischemic changes and parenchymal volume loss of the brain. Small chronic infarcts within the left inferior cerebellum, right thalamus, and left lentiform nucleus. 3. No large vessel occlusion or aneurysm. 4. Moderate 60% left proximal ICA stenosis. No additional segment of hemodynamically significant stenosis by NASCET criteria.   Echocardiogram  Study Conclusions  - Left ventricle: The cavity size was normal. Wall thickness was   increased in a pattern of mild LVH. Systolic function was normal.   The estimated ejection fraction was in the range of 55% to 60%.   Wall motion was normal; there were no regional wall motion   abnormalities. Doppler parameters are consistent with abnormal   left ventricular relaxation (grade 1 diastolic dysfunction). - Aortic valve: There was trivial regurgitation. - Ascending aorta: The ascending aorta was mildly dilated. - Pulmonary arteries: Systolic pressure was mildly increased. PA   peak pressure: 33 mm Hg (S).  Impressions:  - Normal LV systolic function; mild LVH; mild diastolic   dysfunction; mildly dilated ascending aorta; trace TR with mild   pulmonary hypertension.  Discharge Instructions: Discharge Instructions    Ambulatory referral to Neurology   Complete by:   As directed    Follow up with stroke clinic NP (Jessica Vanschaick or Cecille Rubin, if both not available, consider Zachery Dauer, or Ahern) at [facility] in about 4 weeks. Thanks.   Diet - low sodium heart healthy   Complete by:  As directed    Diet - low sodium heart healthy   Complete by:  As directed    Discharge instructions   Complete by:  As directed    Zachary [name], Zachary [name] were hospitalized for dizziness and we think that this was related to a stroke despite the images not being able to pick up that stroke.   We have scheduled a follow up for you in our clinic on July 9th at 9:15 in the morning, please call us to reschedule if this doesn't work for your schedule  Please note the changes that we have made to your medications:  START taking plavix 75 mg daily  CONTINUE taking aspirin 81 mg daily  AFTER 3 WEEKS you should stop taking the aspirin  START taking Crestor  START taking lisinopril 20 mg daily ( you were previously taking 5 mg)   Please call our clinic if you have any questions or concerns, we may be able to help and keep you from a long and expensive emergency room wait. Our clinic and after hours phone number is [phone], the best time to call is Monday through Friday 9 am to 4 pm but there is always someone available 24/7 if you have an emergency. If you need medication refills please notify your pharmacy one week in advance and they will send [place] a request.   Increase activity slowly   Complete by:  As directed    Increase activity slowly   Complete by:  As directed       Signed: Ledell Noss, MD 12/19/2017, 3:42 PM   Pager: [phone]

## 2017-12-19 NOTE — Telephone Encounter (Signed)
Hospital TOC per Dr blum discharge 12/19/2017, appt 12/26/2017. Per dr blum, pt will stay at [facility].

## 2017-12-19 NOTE — Progress Notes (Signed)
CSW met with patient to provide bed offers. Patient prefers [facility], as he knows some of his friends from [place] have gone there. [facility] has a bed available for the patient, will be doing paperwork with him at the hospital prior to transfer.  CSW paged MD about potential discharge today, if medically stable. CSW to follow.  Laveda Abbe, [place] Clinical Social Worker [phone]

## 2017-12-19 NOTE — Progress Notes (Signed)
Internal Medicine Attending  Date: 12/19/2017  Patient name: Zachary [name] Medical record number: [id number] Date of birth: 1929-08-23 Age: 84 y.o. Gender: male  I saw and evaluated the patient. I reviewed the resident's note by Dr. Myrtie Hawk and I agree with the resident's findings and plans as documented in her progress note.  When seen on rounds [name] had numerous complaints including abdominal pain, left hip pain, and left eye pain. I have known him for several years and these complaints are chronic. When the resident performed the abdominal exam he had some guarding and wincing. I performed an abdominal exam when he was distracted and he had no tenderness to palpation or rebound. He had normal active bowel sounds. He also had the left hip pain which is chronic. Nonetheless, given his complaints and a recent fall, a hip plain film was obtained and was unremarkable. The left eye pain is also a very chronic issue and is being addressed with eyedrops and ophthalmology follow-up. His neuro exam revealed some improvement in the strength the left upper and lower extremities. He was unsure if he had any vertigo as he had yet to get out of bed when he was seen on rounds this morning.  At this point, we have completed his workup and initiated duel antiplatelet therapy, high intensity statin therapy, and a slight escalation in his antihypertensive regimen. Thus, he is stable for discharge to a skilled nursing facility for further physical therapy for what we consider to be a likely DWI-negative posterior circulation infarct.  Follow-up will be with neurology and in the Internal Medicine Center.

## 2017-12-19 NOTE — Progress Notes (Signed)
   Subjective: Mr Buckle is feeling ok today regarding dizziness and nausea. He complains of some pain at his left hip, in the area had the femoral head replacement surgery as well as some abdominal pain. He mentions that he his last bowel movement was on Saturday and did not know if it was abnormal. His weakness is improved. Denies any vertigo. We plan to discharge him today after evaluating his hip pain with X-ray.  Objective:  Vital signs in last 24 hours: Vitals:   12/19/17 0416 12/19/17 0851 12/19/17 1114 12/19/17 1529  BP: (!) 173/78 (!) 146/67 (!) 173/75 (!) 175/72  Pulse: 64 67 69 66  Resp: 18 20 20 16   Temp: 97.9 F (36.6 C) 98.4 F (36.9 C) 98.2 F (36.8 C) 98.4 F (36.9 C)  TempSrc: Oral Oral Oral Oral  SpO2: 92% 92% 94% 93%  Weight:      Height:       Ph/E: General: Alert and oriented, in no acute distress Cardiac: Nl S1 and S2, No murmur Lungs: Clear in auscultation with normal breath sounds Abdomen is soft, with mild distention, mild diffuse tenderness (was not reproducable when distracted the patient) Mild tenderness on left hip Extremities: Mild LE edema at left side Neurological exam: No Nystagmus, Mild weakness in left lower extremities (motor strength at LLE: 4/5)  Assessment/Plan:   1)Vertigo and nystagmus, Left hemiparesia: Improved. Neurology is on board and visited him yesterday. Second MRI yesterday still did not show any acute infarction, how ever given his vertigo, nausea and left side weakness, DWI negative posterior circulation infarct is considered as the ethyology for him. As per neurology and as our team recommendation. The plan is:    - ASA 81 mg daily + Plavix 75 mg daily for 3 weeks, then stop ASA and continue Plavix.   - Crestor 20 mg daily.   - Lisinopril 20 mg Daily    - Discharge to SNF today   -Follow up in internal med clinic in a week to adjust BP med if required    - Follow up with ophthalmologist for your decreased vision and  recommend to arrenge for VVS    2)Left hip pain: At the site of previous surgury. X-ray of Hip came back unremarkable     Recommend strating activity and Follow up as outpatient,   Dispo: Anticipated discharge: today  Dewayne Hatch, MD 12/19/2017, 4:11 PM Pager: @[phone] @

## 2017-12-19 NOTE — Progress Notes (Signed)
Pt id d/c to [facility], pt is alert and oriented with no new concerns, pt will be transported from hospital by [facility]. RN attempted to call report to receiving nurse at [facility] but was unable to get any on to answer the phone.

## 2017-12-19 NOTE — Care Management Important Message (Signed)
Important Message  Patient Details  Name: Zachary [name] MRN: [id number] Date of Birth: Sep 26, 1929   Medicare Important Message Given:  Yes    [name] 12/19/2017, 2:59 PM

## 2017-12-19 NOTE — Care Management Note (Signed)
Case Management Note  Patient Details  Name: Zachary [name] MRN: [id number] Date of Birth: 1929-06-26  Subjective/Objective:                    Action/Plan: Pt discharging to [facility] today. CM signing off.  Expected Discharge Date:  12/19/17               Expected Discharge Plan:  Skilled Nursing Facility  In-House Referral:  Clinical Social Work  Discharge planning Services     Post Acute Care Choice:    Choice offered to:     DME Arranged:    DME Agency:     HH Arranged:    [place] Agency:     Status of Service:  Completed, signed off  If discussed at [employer], dates discussed:    Additional Comments:  Pollie Friar, RN 12/19/2017, 3:46 PM

## 2017-12-20 NOTE — Progress Notes (Signed)
2200[facility] still has not arrived to pick patient up for transport to [facility]. Charge nurse Phil RN called [facility] and informed patient still on list for pick up just running late.This nurse was told during shift change report that report had been called to [facility].This nurse found out at 2200 that report had not been given based on previous nurse progress note. This nurse has attempted to call report three times to number [phone] and has received no answer.2245 This nurse called Loralie Champagne for assistance. 2310 This nurse was finally able to give report to [facility] LPN. . Patient still awaiting transport from [facility].

## 2017-12-20 NOTE — Progress Notes (Signed)
0020  Patient is confused alert to self and hospital but not oriented to date and time right now.[facility] here to pick patient up for transport to [facility]. Patient is upset that it has taken so long for transportation to arrive to take him to nursing home.Patient stated," Since it's so late, I might as well stay the night here."Explained to patient that he has to go to nursing tonight since he has already been discharged.Patient now asking for few minutes to get his thoughts and cup of coffee before allowing [facility] to transport him to [facility].0045 Patient left with PTAR.

## 2017-12-26 ENCOUNTER — Ambulatory Visit: Payer: Medicare Other

## 2017-12-26 ENCOUNTER — Encounter: Payer: Self-pay | Admitting: Internal Medicine

## 2018-01-01 NOTE — Telephone Encounter (Signed)
Unable to speak w/ pt at [facility]

## 2018-01-10 ENCOUNTER — Encounter: Payer: Self-pay | Admitting: *Deleted

## 2018-01-12 ENCOUNTER — Telehealth: Payer: Self-pay | Admitting: Internal Medicine

## 2018-01-12 NOTE — Telephone Encounter (Signed)
Thank you, it looks like his blood pressure was controlled with lisinopril 20 mg daily while he was hospitalized.

## 2018-01-12 NOTE — Telephone Encounter (Signed)
PT states that pt has not been taking his BP meds because he doesn't have any ordered, RN went over his med list and she states she will notify nursing at facility, she states pt has nosymptoms except his usual h/a.  Triage will call facility Monday to check on medlist

## 2018-01-12 NOTE — Telephone Encounter (Signed)
PT from [facility] request VO for PT.  Also PT [facility]) reporting patient with a BP of 180/110.  Please call back as soon as possible.
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
## 2018-01-17 ENCOUNTER — Telehealth: Payer: Self-pay

## 2018-01-17 ENCOUNTER — Ambulatory Visit: Payer: Self-pay | Admitting: Adult Health

## 2018-01-17 NOTE — Telephone Encounter (Signed)
Patient no show for appointment.

## 2018-01-18 ENCOUNTER — Encounter: Payer: Self-pay | Admitting: Adult Health

## 2018-01-24 ENCOUNTER — Other Ambulatory Visit: Payer: Self-pay | Admitting: *Deleted

## 2018-01-24 DIAGNOSIS — G894 Chronic pain syndrome: Secondary | ICD-10-CM

## 2018-01-24 NOTE — Telephone Encounter (Signed)
Call from [facility] [place] - states pt is down to his last 3 tabs of Methadone and will be out med by tomorrow. Requesting hard copies. Stated someone would be able to pick up hard copies tomorrow - need to call [phone] for pick up.

## 2018-01-24 NOTE — Telephone Encounter (Signed)
Hi Glenda,   I think I might have accidentally refused the medication. I will talk to the attending tomorrow morning.   Thank you  Tensley Wery

## 2018-01-25 ENCOUNTER — Other Ambulatory Visit: Payer: Self-pay | Admitting: *Deleted

## 2018-01-25 ENCOUNTER — Telehealth: Payer: Self-pay | Admitting: *Deleted

## 2018-01-25 DIAGNOSIS — G894 Chronic pain syndrome: Secondary | ICD-10-CM

## 2018-01-25 MED ORDER — METHADONE HCL 5 MG PO TABS: 5.0000 mg | ORAL_TABLET | Freq: Three times a day (TID) | ORAL | 0 refills | Status: DC

## 2018-01-25 NOTE — Telephone Encounter (Signed)
Reviewed with Dr. Eileen Stanford.  Unable to check on Zachary [name] narcotic database, but reviewed chart and dose appears appropriate based on previous documentation.

## 2018-01-25 NOTE — Telephone Encounter (Signed)
Hi Helen,   Thanks for letting me know. I believe PT can follow up again sometime tomorrow to see if he will be compliant.

## 2018-01-25 NOTE — Telephone Encounter (Signed)
Dr Eileen Stanford could not send Methadone electronically. Please re-send Thanks

## 2018-01-25 NOTE — Addendum Note (Signed)
Addended by: Ebbie Latus on: 01/25/2018 09:10 AM   Modules accepted: Orders

## 2018-01-25 NOTE — Telephone Encounter (Signed)
Thank You.

## 2018-01-25 NOTE — Telephone Encounter (Signed)
Talked to [facility] to let her know Methadone rxs can be sent electronically to the pharmacy. She stated prior rxs have been mailed to the pharmacy so she wasn't sure.

## 2018-01-25 NOTE — Telephone Encounter (Signed)
Pt is refusing PT services from [facility], states 'he wants to be left alone"

## 2018-01-25 NOTE — Addendum Note (Signed)
Addended by: Gilles Chiquito B on: 01/25/2018 10:33 AM   Modules accepted: Orders

## 2018-02-22 ENCOUNTER — Other Ambulatory Visit: Payer: Self-pay | Admitting: *Deleted

## 2018-02-22 ENCOUNTER — Other Ambulatory Visit: Payer: Self-pay | Admitting: Internal Medicine

## 2018-02-22 DIAGNOSIS — F419 Anxiety disorder, unspecified: Secondary | ICD-10-CM

## 2018-02-22 DIAGNOSIS — G894 Chronic pain syndrome: Secondary | ICD-10-CM

## 2018-02-22 NOTE — Telephone Encounter (Signed)
Fax from [facility] - states pt has been refusing artificial tears ointment. States he does not want to use it anymore. Please advise. If discontinued, please remove from current med list. Thanks

## 2018-02-22 NOTE — Telephone Encounter (Signed)
Hi,   If he feels he does not need the artificial tears, that's fine. As far as the methadone and Ativan, I think it is appropriate since he has been on it chronically. Previously on Ativan 1mg  scheduled with PRN, now on only 0.5mg  TID. This will have to be e-scribed by an attending as I do not have privileges.   Thank you

## 2018-02-26 MED ORDER — METHADONE HCL 5 MG PO TABS: 5.0000 mg | ORAL_TABLET | Freq: Three times a day (TID) | ORAL | 0 refills | Status: DC

## 2018-02-26 MED ORDER — LORAZEPAM 0.5 MG PO TABS: 0.5000 mg | ORAL_TABLET | Freq: Two times a day (BID) | ORAL | 0 refills | Status: DC

## 2018-02-26 NOTE — Telephone Encounter (Signed)
Reviewed chart, both meds last addressed 04/2017 with review of database and toxassure. He is overdue for a visit with [place], missed his appointment in July, none currently scheduled. Will refill with 1 month supply, but these are high risk medicines for him and we need to be following him closely in order to continue prescribing these. Please schedule for appointment.

## 2018-02-27 NOTE — Telephone Encounter (Signed)
[facility] informed to discontinued artificial tears per Dr Eileen Stanford.

## 2018-03-23 ENCOUNTER — Other Ambulatory Visit: Payer: Self-pay | Admitting: *Deleted

## 2018-03-23 DIAGNOSIS — G894 Chronic pain syndrome: Secondary | ICD-10-CM

## 2018-03-25 NOTE — Telephone Encounter (Signed)
Thanks Bonnita Nasuti,   Will follow up on it.

## 2018-03-27 ENCOUNTER — Telehealth: Payer: Self-pay

## 2018-03-27 NOTE — Telephone Encounter (Signed)
This is being addressed in separate refill encounter from 03/23/2018. [name], RN, BSN

## 2018-03-27 NOTE — Telephone Encounter (Signed)
Zachary [name] with [name] requesting a refill on methadone (DOLOPHINE) 5 MG tablet.

## 2018-03-28 NOTE — Telephone Encounter (Signed)
Thanks Lauren,   I will address this at my continuity clinic on Friday as I am currently on an Elective.

## 2018-03-30 ENCOUNTER — Encounter: Payer: Medicare Other | Admitting: Internal Medicine

## 2018-03-30 MED ORDER — METHADONE HCL 5 MG PO TABS: 5.0000 mg | ORAL_TABLET | Freq: Three times a day (TID) | ORAL | 0 refills | Status: DC

## 2018-04-03 ENCOUNTER — Other Ambulatory Visit: Payer: Self-pay | Admitting: *Deleted

## 2018-04-03 DIAGNOSIS — F419 Anxiety disorder, unspecified: Secondary | ICD-10-CM

## 2018-04-03 NOTE — Telephone Encounter (Signed)
Call from [facility] for a refill on Lorazepam. Thanks

## 2018-04-04 MED ORDER — LORAZEPAM 0.5 MG PO TABS: 0.5000 mg | ORAL_TABLET | Freq: Two times a day (BID) | ORAL | 0 refills | Status: DC

## 2018-04-25 ENCOUNTER — Other Ambulatory Visit: Payer: Self-pay

## 2018-04-25 DIAGNOSIS — G894 Chronic pain syndrome: Secondary | ICD-10-CM

## 2018-04-25 NOTE — Telephone Encounter (Signed)
methadone (DOLOPHINE) 5 MG tablet, refill request @  [facility], [place] [address] [phone] (Phone) [phone] (Fax)

## 2018-04-25 NOTE — Telephone Encounter (Signed)
Patient cancelled appointment with Dr. Eileen Stanford on 03-30-18.  He is currently booked for November and December and will not have any openings until mid February.  Forwarding back to triage.

## 2018-04-27 MED ORDER — METHADONE HCL 5 MG PO TABS: 5.0000 mg | ORAL_TABLET | Freq: Three times a day (TID) | ORAL | 0 refills | Status: DC

## 2018-05-08 ENCOUNTER — Other Ambulatory Visit: Payer: Self-pay | Admitting: *Deleted

## 2018-05-08 DIAGNOSIS — F419 Anxiety disorder, unspecified: Secondary | ICD-10-CM

## 2018-05-08 NOTE — Telephone Encounter (Signed)
No f/u appt has been scheduled; I will send message to front office.

## 2018-05-09 MED ORDER — LORAZEPAM 0.5 MG PO TABS: 0.5000 mg | ORAL_TABLET | Freq: Two times a day (BID) | ORAL | 0 refills | Status: DC

## 2018-05-21 ENCOUNTER — Telehealth: Payer: Self-pay

## 2018-05-21 NOTE — Telephone Encounter (Signed)
Zachary [name] with [facility] needs to speak with a nurse about  LORazepam (ATIVAN) 0.5 MG tablet. Please call back.

## 2018-05-21 NOTE — Telephone Encounter (Signed)
Per shawna at [facility] the ativan is being dosed by a psych NP, VO to continue with pysch order for this medication and discontinue ativan from [facility], do you agree?

## 2018-05-22 NOTE — Telephone Encounter (Signed)
Hi Helen,   Thanks for the update. I agree with the plan.

## 2018-05-30 ENCOUNTER — Other Ambulatory Visit: Payer: Self-pay

## 2018-05-30 DIAGNOSIS — G894 Chronic pain syndrome: Secondary | ICD-10-CM

## 2018-05-30 NOTE — Telephone Encounter (Signed)
Zachary [name] with [name] requesting a refill on methadone (DOLOPHINE) 5 MG tablet(Expired) @  [place], [place] [address] [phone] (Phone) [phone] (Fax)

## 2018-05-30 NOTE — Telephone Encounter (Signed)
Dr Eileen Stanford does not have any available appts.

## 2018-05-31 MED ORDER — METHADONE HCL 5 MG PO TABS: 5.0000 mg | ORAL_TABLET | Freq: Three times a day (TID) | ORAL | 0 refills | Status: DC

## 2018-05-31 NOTE — Telephone Encounter (Signed)
Received a refill request for Zachary [name] methadone for his chronic pain. I called [facility] and verified it was last refilled on 04/28/2018 #90

## 2018-06-26 ENCOUNTER — Other Ambulatory Visit: Payer: Self-pay | Admitting: *Deleted

## 2018-06-26 DIAGNOSIS — G894 Chronic pain syndrome: Secondary | ICD-10-CM

## 2018-06-26 NOTE — Telephone Encounter (Signed)
Hi Helen,  Is the 90 days up?

## 2018-06-27 NOTE — Telephone Encounter (Signed)
Call from [facility]-checking on status of tramadol and methadone refill.  States pt will be out soon.Regenia Skeeter, Darlene Cassady1/8/20209:39 AM

## 2018-06-28 ENCOUNTER — Other Ambulatory Visit: Payer: Self-pay | Admitting: Internal Medicine

## 2018-06-28 MED ORDER — METHADONE HCL 5 MG PO TABS: 5.0000 mg | ORAL_TABLET | Freq: Three times a day (TID) | ORAL | 0 refills | Status: DC

## 2018-06-28 MED ORDER — TRAMADOL HCL 50 MG PO TABS: 50.0000 mg | ORAL_TABLET | Freq: Every day | ORAL | 3 refills | Status: DC | PRN

## 2018-06-28 NOTE — Telephone Encounter (Signed)
Hi Kaye,   Thanks for the update. I'm aware that Mr. Hupp has chronic pain syndrome however I'm unsure how much of the narcotics he is taking. With his age, I'm slightly worried and wonder if he requires the methadone. I have personally not seen him at the office.   Is it possible for you to provide me [facility] number?  Thanks!

## 2018-07-12 ENCOUNTER — Telehealth: Payer: Self-pay | Admitting: Internal Medicine

## 2018-07-12 NOTE — Telephone Encounter (Signed)
Return call to [place] at [place] - requesting for the doctor over the residence to sign off orders; given Dr Evette Doffing, Attending this am.

## 2018-07-12 NOTE — Telephone Encounter (Signed)
Per Elmyra Ricks at [facility] has a question about an order [id number]

## 2018-07-26 ENCOUNTER — Other Ambulatory Visit: Payer: Self-pay | Admitting: *Deleted

## 2018-07-26 DIAGNOSIS — G894 Chronic pain syndrome: Secondary | ICD-10-CM

## 2018-07-26 NOTE — Telephone Encounter (Signed)
Last rx was written 06/28/18.

## 2018-07-30 ENCOUNTER — Other Ambulatory Visit: Payer: Self-pay | Admitting: Internal Medicine

## 2018-07-30 DIAGNOSIS — G894 Chronic pain syndrome: Secondary | ICD-10-CM

## 2018-07-30 MED ORDER — METHADONE HCL 5 MG PO TABS: 5.0000 mg | ORAL_TABLET | Freq: Three times a day (TID) | ORAL | 0 refills | Status: DC

## 2018-07-30 NOTE — Telephone Encounter (Signed)
Needs refill on methadone (DOLOPHINE) 5 MG tablet [facility], [place] [address] ; pt contact [phone]

## 2018-07-31 ENCOUNTER — Telehealth: Payer: Self-pay | Admitting: *Deleted

## 2018-07-31 ENCOUNTER — Other Ambulatory Visit: Payer: Self-pay | Admitting: Internal Medicine

## 2018-07-31 DIAGNOSIS — W19XXXS Unspecified fall, sequela: Secondary | ICD-10-CM

## 2018-07-31 MED ORDER — "BANDAGE ROLL 3""X75"" MISC": 0 refills | Status: AC

## 2018-07-31 MED ORDER — CARRASYN V WOUND DRESSING EX GEL: CUTANEOUS | 0 refills | Status: AC | PRN

## 2018-07-31 NOTE — Telephone Encounter (Signed)
Received fax from [name] Sydnee Levans) with the following statement "Resident had an unwitnessed fall-has scrapes on forehead, knees, and right elbow. Left hand bruised and cuts. May we get order to clean and apply antibiotic ointment daily."  Will send to pcp for review.    Please note, facility is unable to accept verbal orders and will need to have authorization documented on letterhead or prescription (hard copy) and then faxed to [phone].Despina Hidden Cassady2/11/20203:28 PM

## 2018-07-31 NOTE — Telephone Encounter (Signed)
Hi Kaye,   Thanks for the update. I will print prescriptions and leave them in my box.

## 2018-08-10 ENCOUNTER — Other Ambulatory Visit: Payer: Self-pay | Admitting: Internal Medicine

## 2018-12-25 ENCOUNTER — Encounter: Payer: Self-pay | Admitting: Podiatry

## 2018-12-25 ENCOUNTER — Ambulatory Visit (INDEPENDENT_AMBULATORY_CARE_PROVIDER_SITE_OTHER): Payer: Medicare Other | Admitting: Podiatry

## 2018-12-25 ENCOUNTER — Other Ambulatory Visit: Payer: Self-pay

## 2018-12-25 DIAGNOSIS — L853 Xerosis cutis: Secondary | ICD-10-CM | POA: Diagnosis not present

## 2018-12-25 DIAGNOSIS — B351 Tinea unguium: Secondary | ICD-10-CM | POA: Diagnosis not present

## 2018-12-25 DIAGNOSIS — M79674 Pain in right toe(s): Secondary | ICD-10-CM

## 2018-12-25 DIAGNOSIS — M79675 Pain in left toe(s): Secondary | ICD-10-CM

## 2018-12-25 NOTE — Patient Instructions (Signed)
Diabetes Mellitus and Foot Care Foot care is an important part of your health, especially when you have diabetes. Diabetes may cause you to have problems because of poor blood flow (circulation) to your feet and legs, which can cause your skin to:  Become thinner and drier.  Break more easily.  Heal more slowly.  Peel and crack. You may also have nerve damage (neuropathy) in your legs and feet, causing decreased feeling in them. This means that you may not notice minor injuries to your feet that could lead to more serious problems. Noticing and addressing any potential problems early is the best way to prevent future foot problems. How to care for your feet Foot hygiene  Wash your feet daily with warm water and mild soap. Do not use hot water. Then, pat your feet and the areas between your toes until they are completely dry. Do not soak your feet as this can dry your skin.  Trim your toenails straight across. Do not dig under them or around the cuticle. File the edges of your nails with an emery board or nail file.  Apply a moisturizing lotion or petroleum jelly to the skin on your feet and to dry, brittle toenails. Use lotion that does not contain alcohol and is unscented. Do not apply lotion between your toes. Shoes and socks  Wear clean socks or stockings every day. Make sure they are not too tight. Do not wear knee-high stockings since they may decrease blood flow to your legs.  Wear shoes that fit properly and have enough cushioning. Always look in your shoes before you put them on to be sure there are no objects inside.  To break in new shoes, wear them for just a few hours a day. This prevents injuries on your feet. Wounds, scrapes, corns, and calluses  Check your feet daily for blisters, cuts, bruises, sores, and redness. If you cannot see the bottom of your feet, use a mirror or ask someone for help.  Do not cut corns or calluses or try to remove them with medicine.  If you  find a minor scrape, cut, or break in the skin on your feet, keep it and the skin around it clean and dry. You may clean these areas with mild soap and water. Do not clean the area with peroxide, alcohol, or iodine.  If you have a wound, scrape, corn, or callus on your foot, look at it several times a day to make sure it is healing and not infected. Check for: ? Redness, swelling, or pain. ? Fluid or blood. ? Warmth. ? Pus or a bad smell. General instructions  Do not cross your legs. This may decrease blood flow to your feet.  Do not use heating pads or hot water bottles on your feet. They may burn your skin. If you have lost feeling in your feet or legs, you may not know this is happening until it is too late.  Protect your feet from hot and cold by wearing shoes, such as at the beach or on hot pavement.  Schedule a complete foot exam at least once a year (annually) or more often if you have foot problems. If you have foot problems, report any cuts, sores, or bruises to your health care provider immediately. Contact a health care provider if:  You have a medical condition that increases your risk of infection and you have any cuts, sores, or bruises on your feet.  You have an injury that is not   healing.  You have redness on your legs or feet.  You feel burning or tingling in your legs or feet.  You have pain or cramps in your legs and feet.  Your legs or feet are numb.  Your feet always feel cold.  You have pain around a toenail. Get help right away if:  You have a wound, scrape, corn, or callus on your foot and: ? You have pain, swelling, or redness that gets worse. ? You have fluid or blood coming from the wound, scrape, corn, or callus. ? Your wound, scrape, corn, or callus feels warm to the touch. ? You have pus or a bad smell coming from the wound, scrape, corn, or callus. ? You have a fever. ? You have a red line going up your leg. Summary  Check your feet every day  for cuts, sores, red spots, swelling, and blisters.  Moisturize feet and legs daily.  Wear shoes that fit properly and have enough cushioning.  If you have foot problems, report any cuts, sores, or bruises to your health care provider immediately.  Schedule a complete foot exam at least once a year (annually) or more often if you have foot problems. This information is not intended to replace advice given to you by your health care provider. Make sure you discuss any questions you have with your health care provider. Document Released: 06/03/2000 Document Revised: 07/19/2017 Document Reviewed: 07/08/2016 Elsevier Patient Education  2020 [employer]   Onychomycosis/Fungal Toenails  WHAT IS IT? An infection that lies within the keratin of your nail plate that is caused by a fungus.  WHY ME? Fungal infections affect all ages, sexes, races, and creeds.  There may be many factors that predispose you to a fungal infection such as age, coexisting medical conditions such as diabetes, or an autoimmune disease; stress, medications, fatigue, genetics, etc.  Bottom line: fungus thrives in a warm, moist environment and your shoes offer such a location.  IS IT CONTAGIOUS? Theoretically, yes.  You do not want to share shoes, nail clippers or files with someone who has fungal toenails.  Walking around barefoot in the same room or sleeping in the same bed is unlikely to transfer the organism.  It is important to realize, however, that fungus can spread easily from one nail to the next on the same foot.  HOW DO WE TREAT THIS?  There are several ways to treat this condition.  Treatment may depend on many factors such as age, medications, pregnancy, liver and kidney conditions, etc.  It is best to ask your doctor which options are available to you.  1. No treatment.   Unlike many other medical concerns, you can live with this condition.  However for many people this can be a painful condition and may lead to  ingrown toenails or a bacterial infection.  It is recommended that you keep the nails cut short to help reduce the amount of fungal nail. 2. Topical treatment.  These range from herbal remedies to prescription strength nail lacquers.  About 40-50% effective, topicals require twice daily application for approximately 9 to 12 months or until an entirely new nail has grown out.  The most effective topicals are medical grade medications available through physicians offices. 3. Oral antifungal medications.  With an 80-90% cure rate, the most common oral medication requires 3 to 4 months of therapy and stays in your system for a year as the new nail grows out.  Oral antifungal medications do require   blood work to make sure it is a safe drug for you.  A liver function panel will be performed prior to starting the medication and after the first month of treatment.  It is important to have the blood work performed to avoid any harmful side effects.  In general, this medication safe but blood work is required. 4. Laser Therapy.  This treatment is performed by applying a specialized laser to the affected nail plate.  This therapy is noninvasive, fast, and non-painful.  It is not covered by insurance and is therefore, out of pocket.  The results have been very good with a 80-95% cure rate.  [facility] is the only practice in the area to offer this therapy. 5. Permanent Nail Avulsion.  Removing the entire nail so that a new nail will not grow back. 
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
## 2019-02-13 ENCOUNTER — Ambulatory Visit: Payer: Medicare Other | Admitting: Internal Medicine

## 2019-02-13 ENCOUNTER — Other Ambulatory Visit: Payer: Self-pay

## 2019-02-13 DIAGNOSIS — I10 Essential (primary) hypertension: Secondary | ICD-10-CM

## 2019-02-13 NOTE — Progress Notes (Signed)
I tried to reach out to [name] who is currently a resident of [facility] but was not able to reach him.  I was able to speak to his nurse who made me aware that he has a primary care physician in house and is no longer a patient of the internal medicine clinic.

## 2019-02-19 DIAGNOSIS — J189 Pneumonia, unspecified organism: Secondary | ICD-10-CM

## 2019-02-19 HISTORY — DX: Pneumonia, unspecified organism: J18.9

## 2019-03-01 ENCOUNTER — Encounter (HOSPITAL_COMMUNITY): Payer: Self-pay | Admitting: Emergency Medicine

## 2019-03-01 ENCOUNTER — Inpatient Hospital Stay (HOSPITAL_COMMUNITY): Payer: Medicare Other

## 2019-03-01 ENCOUNTER — Other Ambulatory Visit: Payer: Self-pay

## 2019-03-01 ENCOUNTER — Inpatient Hospital Stay (HOSPITAL_COMMUNITY)
Admission: EM | Admit: 2019-03-01 | Discharge: 2019-03-15 | DRG: 178 | Disposition: A | Discharge status: Home / Self Care | Payer: Medicare Other | Attending: Internal Medicine | Admitting: Internal Medicine

## 2019-03-01 ENCOUNTER — Emergency Department (HOSPITAL_COMMUNITY): Payer: Medicare Other

## 2019-03-01 DIAGNOSIS — L539 Erythematous condition, unspecified: Secondary | ICD-10-CM | POA: Diagnosis not present

## 2019-03-01 DIAGNOSIS — Z79891 Long term (current) use of opiate analgesic: Secondary | ICD-10-CM | POA: Diagnosis not present

## 2019-03-01 DIAGNOSIS — Z96642 Presence of left artificial hip joint: Secondary | ICD-10-CM | POA: Diagnosis present

## 2019-03-01 DIAGNOSIS — E875 Hyperkalemia: Secondary | ICD-10-CM | POA: Diagnosis present

## 2019-03-01 DIAGNOSIS — R7881 Bacteremia: Secondary | ICD-10-CM | POA: Diagnosis not present

## 2019-03-01 DIAGNOSIS — Z7902 Long term (current) use of antithrombotics/antiplatelets: Secondary | ICD-10-CM | POA: Diagnosis not present

## 2019-03-01 DIAGNOSIS — I739 Peripheral vascular disease, unspecified: Secondary | ICD-10-CM | POA: Diagnosis present

## 2019-03-01 DIAGNOSIS — K219 Gastro-esophageal reflux disease without esophagitis: Secondary | ICD-10-CM | POA: Diagnosis present

## 2019-03-01 DIAGNOSIS — M1712 Unilateral primary osteoarthritis, left knee: Secondary | ICD-10-CM | POA: Diagnosis present

## 2019-03-01 DIAGNOSIS — N400 Enlarged prostate without lower urinary tract symptoms: Secondary | ICD-10-CM

## 2019-03-01 DIAGNOSIS — Z79899 Other long term (current) drug therapy: Secondary | ICD-10-CM | POA: Diagnosis not present

## 2019-03-01 DIAGNOSIS — N179 Acute kidney failure, unspecified: Secondary | ICD-10-CM | POA: Diagnosis not present

## 2019-03-01 DIAGNOSIS — K449 Diaphragmatic hernia without obstruction or gangrene: Secondary | ICD-10-CM | POA: Diagnosis present

## 2019-03-01 DIAGNOSIS — R296 Repeated falls: Secondary | ICD-10-CM | POA: Diagnosis present

## 2019-03-01 DIAGNOSIS — B952 Enterococcus as the cause of diseases classified elsewhere: Secondary | ICD-10-CM | POA: Diagnosis not present

## 2019-03-01 DIAGNOSIS — N138 Other obstructive and reflux uropathy: Secondary | ICD-10-CM | POA: Diagnosis present

## 2019-03-01 DIAGNOSIS — Z87891 Personal history of nicotine dependence: Secondary | ICD-10-CM

## 2019-03-01 DIAGNOSIS — M40209 Unspecified kyphosis, site unspecified: Secondary | ICD-10-CM | POA: Diagnosis present

## 2019-03-01 DIAGNOSIS — D649 Anemia, unspecified: Secondary | ICD-10-CM | POA: Diagnosis not present

## 2019-03-01 DIAGNOSIS — Y95 Nosocomial condition: Secondary | ICD-10-CM | POA: Diagnosis present

## 2019-03-01 DIAGNOSIS — Z7982 Long term (current) use of aspirin: Secondary | ICD-10-CM

## 2019-03-01 DIAGNOSIS — N401 Enlarged prostate with lower urinary tract symptoms: Secondary | ICD-10-CM | POA: Diagnosis present

## 2019-03-01 DIAGNOSIS — J309 Allergic rhinitis, unspecified: Secondary | ICD-10-CM | POA: Diagnosis present

## 2019-03-01 DIAGNOSIS — K228 Other specified diseases of esophagus: Secondary | ICD-10-CM | POA: Diagnosis present

## 2019-03-01 DIAGNOSIS — Z8673 Personal history of transient ischemic attack (TIA), and cerebral infarction without residual deficits: Secondary | ICD-10-CM | POA: Diagnosis not present

## 2019-03-01 DIAGNOSIS — R1310 Dysphagia, unspecified: Secondary | ICD-10-CM

## 2019-03-01 DIAGNOSIS — R933 Abnormal findings on diagnostic imaging of other parts of digestive tract: Secondary | ICD-10-CM | POA: Diagnosis not present

## 2019-03-01 DIAGNOSIS — K589 Irritable bowel syndrome without diarrhea: Secondary | ICD-10-CM | POA: Diagnosis present

## 2019-03-01 DIAGNOSIS — I340 Nonrheumatic mitral (valve) insufficiency: Secondary | ICD-10-CM | POA: Diagnosis not present

## 2019-03-01 DIAGNOSIS — Z85828 Personal history of other malignant neoplasm of skin: Secondary | ICD-10-CM

## 2019-03-01 DIAGNOSIS — I371 Nonrheumatic pulmonary valve insufficiency: Secondary | ICD-10-CM | POA: Diagnosis present

## 2019-03-01 DIAGNOSIS — G8929 Other chronic pain: Secondary | ICD-10-CM

## 2019-03-01 DIAGNOSIS — R42 Dizziness and giddiness: Secondary | ICD-10-CM

## 2019-03-01 DIAGNOSIS — Z5329 Procedure and treatment not carried out because of patient's decision for other reasons: Secondary | ICD-10-CM | POA: Diagnosis not present

## 2019-03-01 DIAGNOSIS — I700 Atherosclerosis of aorta: Secondary | ICD-10-CM | POA: Diagnosis present

## 2019-03-01 DIAGNOSIS — Z1211 Encounter for screening for malignant neoplasm of colon: Secondary | ICD-10-CM | POA: Diagnosis not present

## 2019-03-01 DIAGNOSIS — Z20828 Contact with and (suspected) exposure to other viral communicable diseases: Secondary | ICD-10-CM | POA: Diagnosis present

## 2019-03-01 DIAGNOSIS — N183 Chronic kidney disease, stage 3 (moderate): Secondary | ICD-10-CM | POA: Diagnosis not present

## 2019-03-01 DIAGNOSIS — R4702 Dysphasia: Secondary | ICD-10-CM | POA: Diagnosis present

## 2019-03-01 DIAGNOSIS — E785 Hyperlipidemia, unspecified: Secondary | ICD-10-CM | POA: Diagnosis present

## 2019-03-01 DIAGNOSIS — E669 Obesity, unspecified: Secondary | ICD-10-CM | POA: Diagnosis present

## 2019-03-01 DIAGNOSIS — R1314 Dysphagia, pharyngoesophageal phase: Secondary | ICD-10-CM | POA: Diagnosis not present

## 2019-03-01 DIAGNOSIS — M79604 Pain in right leg: Secondary | ICD-10-CM | POA: Diagnosis not present

## 2019-03-01 DIAGNOSIS — M25562 Pain in left knee: Secondary | ICD-10-CM

## 2019-03-01 DIAGNOSIS — J189 Pneumonia, unspecified organism: Secondary | ICD-10-CM | POA: Diagnosis present

## 2019-03-01 DIAGNOSIS — B954 Other streptococcus as the cause of diseases classified elsewhere: Secondary | ICD-10-CM | POA: Diagnosis not present

## 2019-03-01 DIAGNOSIS — Z881 Allergy status to other antibiotic agents status: Secondary | ICD-10-CM

## 2019-03-01 DIAGNOSIS — R69 Illness, unspecified: Secondary | ICD-10-CM

## 2019-03-01 DIAGNOSIS — F419 Anxiety disorder, unspecified: Secondary | ICD-10-CM | POA: Diagnosis present

## 2019-03-01 DIAGNOSIS — Z23 Encounter for immunization: Secondary | ICD-10-CM

## 2019-03-01 DIAGNOSIS — Z8249 Family history of ischemic heart disease and other diseases of the circulatory system: Secondary | ICD-10-CM

## 2019-03-01 DIAGNOSIS — I10 Essential (primary) hypertension: Secondary | ICD-10-CM | POA: Diagnosis present

## 2019-03-01 DIAGNOSIS — K222 Esophageal obstruction: Secondary | ICD-10-CM | POA: Diagnosis present

## 2019-03-01 DIAGNOSIS — D692 Other nonthrombocytopenic purpura: Secondary | ICD-10-CM | POA: Diagnosis present

## 2019-03-01 DIAGNOSIS — D631 Anemia in chronic kidney disease: Secondary | ICD-10-CM | POA: Diagnosis present

## 2019-03-01 DIAGNOSIS — F329 Major depressive disorder, single episode, unspecified: Secondary | ICD-10-CM | POA: Diagnosis present

## 2019-03-01 DIAGNOSIS — Z1159 Encounter for screening for other viral diseases: Secondary | ICD-10-CM

## 2019-03-01 DIAGNOSIS — I129 Hypertensive chronic kidney disease with stage 1 through stage 4 chronic kidney disease, or unspecified chronic kidney disease: Secondary | ICD-10-CM | POA: Diagnosis present

## 2019-03-01 DIAGNOSIS — M25569 Pain in unspecified knee: Secondary | ICD-10-CM

## 2019-03-01 DIAGNOSIS — R109 Unspecified abdominal pain: Secondary | ICD-10-CM

## 2019-03-01 DIAGNOSIS — G894 Chronic pain syndrome: Secondary | ICD-10-CM

## 2019-03-01 DIAGNOSIS — J690 Pneumonitis due to inhalation of food and vomit: Principal | ICD-10-CM

## 2019-03-01 DIAGNOSIS — B955 Unspecified streptococcus as the cause of diseases classified elsewhere: Secondary | ICD-10-CM | POA: Diagnosis not present

## 2019-03-01 DIAGNOSIS — R0902 Hypoxemia: Secondary | ICD-10-CM | POA: Diagnosis present

## 2019-03-01 DIAGNOSIS — K635 Polyp of colon: Secondary | ICD-10-CM | POA: Diagnosis present

## 2019-03-01 DIAGNOSIS — I2510 Atherosclerotic heart disease of native coronary artery without angina pectoris: Secondary | ICD-10-CM | POA: Diagnosis present

## 2019-03-01 DIAGNOSIS — Z6833 Body mass index (BMI) 33.0-33.9, adult: Secondary | ICD-10-CM

## 2019-03-01 DIAGNOSIS — K5900 Constipation, unspecified: Secondary | ICD-10-CM | POA: Diagnosis present

## 2019-03-01 DIAGNOSIS — I351 Nonrheumatic aortic (valve) insufficiency: Secondary | ICD-10-CM | POA: Diagnosis present

## 2019-03-01 LAB — CBC WITH DIFFERENTIAL/PLATELET
Abs Immature Granulocytes: 0.01 10*3/uL (ref 0.00–0.07)
Basophils Absolute: 0 10*3/uL (ref 0.0–0.1)
Basophils Relative: 0 %
Eosinophils Absolute: 0.2 10*3/uL (ref 0.0–0.5)
Eosinophils Relative: 3 %
HCT: 39.1 % (ref 39.0–52.0)
Hemoglobin: 12.5 g/dL — ABNORMAL LOW (ref 13.0–17.0)
Immature Granulocytes: 0 %
Lymphocytes Relative: 16 %
Lymphs Abs: 0.8 10*3/uL (ref 0.7–4.0)
MCH: 30.9 pg (ref 26.0–34.0)
MCHC: 32 g/dL (ref 30.0–36.0)
MCV: 96.5 fL (ref 80.0–100.0)
Monocytes Absolute: 0.2 10*3/uL (ref 0.1–1.0)
Monocytes Relative: 3 %
Neutro Abs: 3.8 10*3/uL (ref 1.7–7.7)
Neutrophils Relative %: 78 %
Platelets: 159 10*3/uL (ref 150–400)
RBC: 4.05 MIL/uL — ABNORMAL LOW (ref 4.22–5.81)
RDW: 13.7 % (ref 11.5–15.5)
WBC: 4.9 10*3/uL (ref 4.0–10.5)
nRBC: 0 % (ref 0.0–0.2)

## 2019-03-01 LAB — URINALYSIS, ROUTINE W REFLEX MICROSCOPIC
Bilirubin Urine: NEGATIVE
Glucose, UA: NEGATIVE mg/dL
Hgb urine dipstick: NEGATIVE
Ketones, ur: NEGATIVE mg/dL
Leukocytes,Ua: NEGATIVE
Nitrite: NEGATIVE
Protein, ur: NEGATIVE mg/dL
Specific Gravity, Urine: 1.013 (ref 1.005–1.030)
pH: 6 (ref 5.0–8.0)

## 2019-03-01 LAB — TROPONIN I (HIGH SENSITIVITY)
Troponin I (High Sensitivity): 7 ng/L (ref <18)
Troponin I (High Sensitivity): 14 ng/L (ref <18)

## 2019-03-01 LAB — LACTIC ACID, PLASMA
Lactic Acid, Venous: 2.6 mmol/L (ref 0.5–1.9)
Lactic Acid, Venous: 2.1 mmol/L (ref 0.5–1.9)

## 2019-03-01 LAB — COMPREHENSIVE METABOLIC PANEL
ALT: 18 U/L (ref 0–44)
AST: 29 U/L (ref 15–41)
Albumin: 3.2 g/dL — ABNORMAL LOW (ref 3.5–5.0)
Alkaline Phosphatase: 128 U/L — ABNORMAL HIGH (ref 38–126)
Anion gap: 10 (ref 5–15)
BUN: 22 mg/dL (ref 8–23)
CO2: 24 mmol/L (ref 22–32)
Calcium: 8.6 mg/dL — ABNORMAL LOW (ref 8.9–10.3)
Chloride: 106 mmol/L (ref 98–111)
Creatinine, Ser: 2.13 mg/dL — ABNORMAL HIGH (ref 0.61–1.24)
GFR calc Af Amer: 31 mL/min — ABNORMAL LOW (ref >60)
GFR calc non Af Amer: 27 mL/min — ABNORMAL LOW (ref >60)
Glucose, Bld: 107 mg/dL — ABNORMAL HIGH (ref 70–99)
Potassium: 3.8 mmol/L (ref 3.5–5.1)
Sodium: 140 mmol/L (ref 135–145)
Total Bilirubin: 0.5 mg/dL (ref 0.3–1.2)
Total Protein: 6.6 g/dL (ref 6.5–8.1)

## 2019-03-01 LAB — BRAIN NATRIURETIC PEPTIDE: B Natriuretic Peptide: 41 pg/mL (ref 0.0–100.0)

## 2019-03-01 LAB — MRSA PCR SCREENING: MRSA by PCR: NEGATIVE

## 2019-03-01 LAB — SARS CORONAVIRUS 2 BY RT PCR (HOSPITAL ORDER, PERFORMED IN ~~LOC~~ HOSPITAL LAB): SARS Coronavirus 2: NEGATIVE

## 2019-03-01 LAB — MAGNESIUM: Magnesium: 1.9 mg/dL (ref 1.7–2.4)

## 2019-03-01 MED ORDER — ASPIRIN EC 81 MG PO TBEC
81.0000 mg | DELAYED_RELEASE_TABLET | Freq: Every day | ORAL | Status: DC
  Administered 2019-03-01 – 2019-03-15 (×15): 81 mg via ORAL
  Filled 2019-03-01 (×15): qty 1

## 2019-03-01 MED ORDER — ALBUTEROL SULFATE (2.5 MG/3ML) 0.083% IN NEBU: 2.5000 mg | INHALATION_SOLUTION | Freq: Four times a day (QID) | RESPIRATORY_TRACT | Status: DC | PRN

## 2019-03-01 MED ORDER — SODIUM CHLORIDE 0.9 % IV BOLUS: 500.0000 mL | Freq: Once | INTRAVENOUS | Status: DC

## 2019-03-01 MED ORDER — MORPHINE SULFATE (PF) 2 MG/ML IV SOLN
2.0000 mg | Freq: Once | INTRAVENOUS | Status: AC
  Administered 2019-03-01: 09:00:00 2 mg via INTRAVENOUS
  Filled 2019-03-01: qty 1

## 2019-03-01 MED ORDER — RISPERIDONE 0.25 MG PO TABS
0.2500 mg | ORAL_TABLET | Freq: Two times a day (BID) | ORAL | Status: DC
  Administered 2019-03-01 – 2019-03-15 (×28): 0.25 mg via ORAL
  Filled 2019-03-01 (×31): qty 1

## 2019-03-01 MED ORDER — ACETAMINOPHEN 650 MG RE SUPP: 650.0000 mg | Freq: Four times a day (QID) | RECTAL | Status: DC | PRN

## 2019-03-01 MED ORDER — PANTOPRAZOLE SODIUM 40 MG PO TBEC
40.0000 mg | DELAYED_RELEASE_TABLET | Freq: Every day | ORAL | Status: DC
  Administered 2019-03-01 – 2019-03-15 (×15): 40 mg via ORAL
  Filled 2019-03-01 (×15): qty 1

## 2019-03-01 MED ORDER — SENNOSIDES-DOCUSATE SODIUM 8.6-50 MG PO TABS: 1.0000 | ORAL_TABLET | Freq: Every evening | ORAL | Status: DC | PRN

## 2019-03-01 MED ORDER — LORAZEPAM 0.5 MG PO TABS
0.5000 mg | ORAL_TABLET | Freq: Three times a day (TID) | ORAL | Status: DC | PRN
  Administered 2019-03-04 – 2019-03-12 (×8): 0.5 mg via ORAL
  Filled 2019-03-01 (×8): qty 1

## 2019-03-01 MED ORDER — SODIUM CHLORIDE 0.9 % IV SOLN
2.0000 g | Freq: Once | INTRAVENOUS | Status: AC
  Administered 2019-03-01: 09:00:00 2 g via INTRAVENOUS
  Filled 2019-03-01: qty 2

## 2019-03-01 MED ORDER — SODIUM CHLORIDE 0.9 % IV SOLN
3.0000 g | Freq: Two times a day (BID) | INTRAVENOUS | Status: DC
  Administered 2019-03-02 – 2019-03-03 (×3): 3 g via INTRAVENOUS
  Filled 2019-03-01 (×3): qty 8
  Filled 2019-03-01: qty 3

## 2019-03-01 MED ORDER — IPRATROPIUM-ALBUTEROL 0.5-2.5 (3) MG/3ML IN SOLN
3.0000 mL | Freq: Once | RESPIRATORY_TRACT | Status: AC
  Administered 2019-03-01: 3 mL via RESPIRATORY_TRACT
  Filled 2019-03-01: qty 3

## 2019-03-01 MED ORDER — AZITHROMYCIN 250 MG PO TABS
250.0000 mg | ORAL_TABLET | Freq: Every day | ORAL | Status: AC
  Administered 2019-03-02 – 2019-03-05 (×4): 250 mg via ORAL
  Filled 2019-03-01 (×4): qty 1

## 2019-03-01 MED ORDER — TRAMADOL HCL 50 MG PO TABS
50.0000 mg | ORAL_TABLET | Freq: Every day | ORAL | Status: DC | PRN
  Administered 2019-03-05 – 2019-03-12 (×4): 50 mg via ORAL
  Filled 2019-03-01 (×4): qty 1

## 2019-03-01 MED ORDER — SERTRALINE HCL 50 MG PO TABS
150.0000 mg | ORAL_TABLET | Freq: Every day | ORAL | Status: DC
  Administered 2019-03-01 – 2019-03-15 (×15): 150 mg via ORAL
  Filled 2019-03-01: qty 1
  Filled 2019-03-01 (×2): qty 3
  Filled 2019-03-01: qty 1
  Filled 2019-03-01: qty 3
  Filled 2019-03-01 (×6): qty 1
  Filled 2019-03-01: qty 3
  Filled 2019-03-01 (×4): qty 1

## 2019-03-01 MED ORDER — VANCOMYCIN HCL IN DEXTROSE 1-5 GM/200ML-% IV SOLN: 1000.0000 mg | INTRAVENOUS | Status: DC

## 2019-03-01 MED ORDER — METHADONE HCL 5 MG PO TABS: 5.0000 mg | ORAL_TABLET | Freq: Three times a day (TID) | ORAL | Status: DC

## 2019-03-01 MED ORDER — AZITHROMYCIN 500 MG PO TABS
500.0000 mg | ORAL_TABLET | Freq: Every day | ORAL | Status: AC
  Administered 2019-03-01: 14:00:00 500 mg via ORAL
  Filled 2019-03-01: qty 1

## 2019-03-01 MED ORDER — MIRTAZAPINE 15 MG PO TABS
30.0000 mg | ORAL_TABLET | Freq: Every day | ORAL | Status: DC
  Administered 2019-03-01 – 2019-03-14 (×14): 30 mg via ORAL
  Filled 2019-03-01: qty 2
  Filled 2019-03-01: qty 1
  Filled 2019-03-01 (×2): qty 2
  Filled 2019-03-01: qty 1
  Filled 2019-03-01 (×2): qty 2
  Filled 2019-03-01: qty 1
  Filled 2019-03-01 (×5): qty 2
  Filled 2019-03-01: qty 1
  Filled 2019-03-01: qty 2

## 2019-03-01 MED ORDER — SENNOSIDES-DOCUSATE SODIUM 8.6-50 MG PO TABS
2.0000 | ORAL_TABLET | Freq: Two times a day (BID) | ORAL | Status: DC
  Administered 2019-03-01 – 2019-03-15 (×27): 2 via ORAL
  Filled 2019-03-01 (×29): qty 2

## 2019-03-01 MED ORDER — ENOXAPARIN SODIUM 40 MG/0.4ML ~~LOC~~ SOLN
40.0000 mg | SUBCUTANEOUS | Status: DC
  Administered 2019-03-01 – 2019-03-07 (×7): 40 mg via SUBCUTANEOUS
  Filled 2019-03-01 (×8): qty 0.4

## 2019-03-01 MED ORDER — METHADONE HCL 10 MG PO TABS
5.0000 mg | ORAL_TABLET | Freq: Three times a day (TID) | ORAL | Status: DC
  Administered 2019-03-01 – 2019-03-15 (×41): 5 mg via ORAL
  Filled 2019-03-01 (×44): qty 1

## 2019-03-01 MED ORDER — SODIUM CHLORIDE 0.9 % IV BOLUS
1000.0000 mL | Freq: Once | INTRAVENOUS | Status: AC
  Administered 2019-03-01: 1000 mL via INTRAVENOUS

## 2019-03-01 MED ORDER — TAMSULOSIN HCL 0.4 MG PO CAPS
0.4000 mg | ORAL_CAPSULE | Freq: Every day | ORAL | Status: DC
  Administered 2019-03-01 – 2019-03-15 (×15): 0.4 mg via ORAL
  Filled 2019-03-01 (×15): qty 1

## 2019-03-01 MED ORDER — VANCOMYCIN HCL 10 G IV SOLR
2000.0000 mg | Freq: Once | INTRAVENOUS | Status: DC
  Filled 2019-03-01: qty 2000

## 2019-03-01 MED ORDER — CARBOXYMETHYLCELLULOSE SODIUM 0.5 % OP SOLN: 2.0000 [drp] | Freq: Three times a day (TID) | OPHTHALMIC | Status: DC

## 2019-03-01 MED ORDER — IPRATROPIUM-ALBUTEROL 0.5-2.5 (3) MG/3ML IN SOLN
3.0000 mL | Freq: Three times a day (TID) | RESPIRATORY_TRACT | Status: DC
  Administered 2019-03-02: 07:00:00 3 mL via RESPIRATORY_TRACT
  Filled 2019-03-01: qty 3

## 2019-03-01 MED ORDER — ATORVASTATIN CALCIUM 40 MG PO TABS
40.0000 mg | ORAL_TABLET | Freq: Every day | ORAL | Status: DC
  Administered 2019-03-01 – 2019-03-14 (×14): 40 mg via ORAL
  Filled 2019-03-01 (×14): qty 1

## 2019-03-01 MED ORDER — CLOPIDOGREL BISULFATE 75 MG PO TABS
75.0000 mg | ORAL_TABLET | Freq: Every day | ORAL | Status: DC
  Administered 2019-03-01 – 2019-03-06 (×6): 75 mg via ORAL
  Filled 2019-03-01 (×6): qty 1

## 2019-03-01 MED ORDER — POLYVINYL ALCOHOL 1.4 % OP SOLN
2.0000 [drp] | Freq: Three times a day (TID) | OPHTHALMIC | Status: DC
  Administered 2019-03-01 – 2019-03-15 (×37): 2 [drp] via OPHTHALMIC
  Filled 2019-03-01 (×3): qty 15

## 2019-03-01 MED ORDER — METHADONE HCL 5 MG PO TABS
5.0000 mg | ORAL_TABLET | Freq: Once | ORAL | Status: AC
  Administered 2019-03-01: 10:00:00 5 mg via ORAL
  Filled 2019-03-01: qty 1

## 2019-03-01 MED ORDER — ACETAMINOPHEN 325 MG PO TABS
650.0000 mg | ORAL_TABLET | Freq: Four times a day (QID) | ORAL | Status: DC | PRN
  Administered 2019-03-03 – 2019-03-07 (×3): 650 mg via ORAL
  Filled 2019-03-01 (×3): qty 2

## 2019-03-01 MED ORDER — SODIUM CHLORIDE 0.9% FLUSH
3.0000 mL | Freq: Two times a day (BID) | INTRAVENOUS | Status: DC
  Administered 2019-03-01 – 2019-03-15 (×17): 3 mL via INTRAVENOUS

## 2019-03-01 MED ORDER — FINASTERIDE 5 MG PO TABS
5.0000 mg | ORAL_TABLET | Freq: Every day | ORAL | Status: DC
  Administered 2019-03-01 – 2019-03-15 (×15): 5 mg via ORAL
  Filled 2019-03-01 (×16): qty 1

## 2019-03-01 MED ORDER — FLUTICASONE PROPIONATE 50 MCG/ACT NA SUSP
2.0000 | Freq: Every day | NASAL | Status: DC
  Administered 2019-03-01 – 2019-03-15 (×12): 2 via NASAL
  Filled 2019-03-01 (×2): qty 16

## 2019-03-01 MED ORDER — IPRATROPIUM-ALBUTEROL 0.5-2.5 (3) MG/3ML IN SOLN
3.0000 mL | Freq: Four times a day (QID) | RESPIRATORY_TRACT | Status: DC
  Administered 2019-03-01 (×2): 3 mL via RESPIRATORY_TRACT
  Filled 2019-03-01 (×2): qty 3

## 2019-03-01 MED ORDER — INFLUENZA VAC A&B SA ADJ QUAD 0.5 ML IM PRSY
0.5000 mL | PREFILLED_SYRINGE | Freq: Once | INTRAMUSCULAR | Status: AC
  Administered 2019-03-02: 13:00:00 0.5 mL via INTRAMUSCULAR
  Filled 2019-03-01: qty 0.5

## 2019-03-01 NOTE — Plan of Care (Signed)
  Problem: Clinical Measurements: Goal: Respiratory complications will improve Outcome: Not Progressing   Problem: Clinical Measurements: Goal: Cardiovascular complication will be avoided Outcome: Progressing   Problem: Activity: Goal: Risk for activity intolerance will decrease Outcome: Progressing   Problem: Elimination: Goal: Will not experience complications related to bowel motility Outcome: Progressing Goal: Will not experience complications related to urinary retention Outcome: Progressing   Problem: Pain Managment: Goal: General experience of comfort will improve Outcome: Progressing   Problem: Safety: Goal: Ability to remain free from injury will improve Outcome: Progressing   Problem: Skin Integrity: Goal: Risk for impaired skin integrity will decrease Outcome: Progressing

## 2019-03-01 NOTE — H&P (Signed)
Date: 03/01/2019               Patient Name:  Zachary [name] MRN: [id number]  DOB: 15-Jun-1930 Age / Sex: 85 y.o., male   PCP: Jean Rosenthal, MD         Medical Service: Internal Medicine Teaching Service         Attending Physician: Dr. Evette Doffing, Mallie Mussel, *    First Contact: Dr. Sheppard Coil Pager: [phone]  Second Contact: Dr. Berline Lopes  Pager: [phone]       After Hours (After 5p/  First Contact Pager: [phone]  weekends / holidays): Second Contact Pager: [phone]   Chief Complaint: Fatigue   History of Present Illness:  Zachary [name] is a 85 year-old male with a medical history of essential HTN, BPH, CKD 3, CVA in 2019, and chronic pain who presented from ALF for evaluation of a 1-2 weeks history of fatigue.  He states he is able to ambulate with a cane at baseline and for the past week he has needed a walker for additional support during ambulation.  In addition to this he has also been experiencing shortness of breath at rest associated with a dry cough.  He denies recent illness, fevers, chills, and sick contacts.  Denies COVID exposures. He noticed he has been having trouble swallowing for "a while." At times, he has has to use his hand to push in his throat to help bring the food down. He is not sure how long ago this started. He denies nausea or vomiting, abdominal pain, urinary symptoms, and changes in bowel movement (usually constipated due to chronic opiate use).    Meds:  Methadone 5 mg q8h Tramadol 50 mg QD PRN  Finasteride 5 mg daily Tamsulosin 0.4 mg daily Mirtazapine 30 mg QHS  Atorvastatin 40 mg daily Lorazepam 0.5 mg every 8 hours PRN Risperdal 0.25 mg twice daily Sertraline 150 mg daily Omeprazole 40 mg daily Aspirin 81 mg daily Plavix 75 mg daily Flonase BID Levocetirizine 5 mg QD  Miralax daliy  Senokot BID    Allergies: Allergies as of 03/01/2019 - Review Complete 03/01/2019  Allergen Reaction Noted  . Clindamycin/lincomycin Rash 01/23/2013   . Lincomycin hcl Rash 01/23/2013   Past Medical History:  Diagnosis Date  . Alcoholism ([place])    Remote  . Anxiety 06/22/2006  . Aortic atherosclerosis ([place]) 02/26/2015   Seen on CT scan, currently asymptomatic  . Arthritis of hip 05/21/2010   s/p left hip total arthroplasty   . Benign prostatic hypertrophy with urinary obstruction 06/22/2006  . Cataract   . Cerebral thrombosis with cerebral infarction 12/17/2017  . Chronic kidney disease, stage 3 ([place]) 11/08/2013  . Chronic pain disorder 06/22/2006  . Constipation due to pain medication 06/03/2011  . Diverticulosis of colon 06/22/2006  . Essential hypertension 06/22/2006  . Facial basal cell cancer 06/22/2006  . Gastritis 07/06/2012   Seen on EGD 08/02/2006   . Gastroesophageal reflux disease with stricture 06/22/2006   With esophagitis, requiring dilatation 08/02/2006   . Hyperlipidemia   . Irritable bowel syndrome   . Major depression 06/22/2006  . Obesity (BMI 30.0-34.9) 07/06/2012  . Osteoarthritis of left knee 03/07/2013  . Squamous cell skin cancer 07/06/2012   In situ   . Swelling of left lower extremity 12/07/2012   Responds to lasix 40 mg daily       Family History:  Family History  Problem Relation Age of Onset  . Pneumonia Mother 19  .  Heart attack Father 33  . Early death Brother        Specifics unknown  . Healthy Daughter   . Healthy Son   . Heart disease Brother        Specifics unknown  . Pancreatic cancer Sister   . Early death Sister 3       Hit by truck on [address]  . Healthy Daughter      Social History: Currently living at [facility]. Able to ambulate with cane or walker at baseline. Minimal assist for ADLs. Former smoker, quit 50 years ago. Denies alcohol and illicit substance use.   Review of Systems: A complete ROS was negative except as per HPI.   Physical Exam: Blood pressure (!) 139/115, pulse 99, temperature (!) 100.4 F (38 C), temperature source Rectal, resp. rate 15, height 5\' 7"  (1.702 m), weight 96.2  kg, SpO2 96 %.  General: chronically ill appearing male, dyspneic, in mild distress  HENT: NCAT, neck supple and FROM, OP clear without exudates or erythema, dry MM  Eyes: anicteric sclera, PERRL Cardiac: tachycardic, nl S1/S2, no murmurs, rubs or gallops, no JVD  Pulm: diffuse expiratory wheezes, no crackles, dyspneic but able to speak in full sentences  Abd: soft, NTND, bowel sounds are hypoactive  Neuro: A&Ox3, diffusely weak but able to lift all extremities against gravity, follows commands  Ext: no peripheral edema, 2+ DP pulses on LLE and unable to palpate on RLE  MSK: L knee with full ROM, point tenderness on lateral aspect of L knee with mild bruising, no erythema noted  Derm: chronic bilateral LE erythema with associated warmth, no purulence or fluctuance noted  Psych: attentive, appropriate affect, answers questions appropriately    EKG: personally reviewed my interpretation is sinus tachycardia, frequent PVCs  CXR: personally reviewed my interpretation is right middle and lower lobe opacities   Assessment & Plan by Problem: Active Problems:   Pneumonia   AKI (acute kidney injury) ([place])   # Aspiration pneumonia: Patient is presenting with a 1-week history of fatigue, dyspnea, and dry cough and was found to be febrile with T 100.4. CXR shows a RML and RLL opacities. UA without WBC, leukocytes, or nitrites, and Bcx were collected. COVID negative. He was started on vanc/cefepime in the ED for hospital acquired pneumonia. However, with his reported history of dysphagia and having to push on his throat at times to swallow, I am concerned he could have aspirated and will therefore change antibiotics to Unasyn for anaerobic coverage. PSI score 117 corresponding to a 8.2-9.3 % mortality. He has diffuse expiratory wheezes on exam but is moving good air and is oxygenating well on room air. Ordered scheduled duonebs q6h and a swallow evaluation. Will continue to monitor closely.   # L knee  pain: Patient complaining of L knee pain that has been present for the past month after he fell at ALF. No imaging performed at the time. On exam, he is tender to palpation over lateral aspect of L knee, there is also mild bruising around this area. He is weight bearing and able to ambulate. I suspect this is likely related to ostearthritis, but will order L knee XR for further evaluation. Low suspicion for septic arthritis. Will continue his home pain regimen as below.   # AKI on CKD 3: Baseline Cr 1.7-1.9 now 2.13 on presentation. He does appear slightly hypovolemic on exam which I suspect is due to poor intake per his report. Will hold his diuretics today and  give 1L NS.   # Essential hypertension: Previously on lisinopril. I reviewed his ALF medication list. He is not on any anti-hypertensive medications other than an alpha blocker for BPH. Will monitor.   # History of CVA in 2019: Continue Plavix and high intensity statin.   # BPH: Continue home finasteride and tamsulosin.   # Chronic pain: Will continue home methadone 5 mg TID and tramadol 50 mg daily as needed. Will also continue his bowel regimen, Senokot BID and miralax daily.    Diet: HH IVF: 1L NS VTE ppx: SQ enoxaparin Code status: Patient asked I call his sister Carlye Grippe to discuss this. Admitted as FULL CODE pending conversation with sister.   Dispo: Admit patient to Inpatient with expected length of stay greater than 2 midnights.  SignedWelford Roche, MD 03/01/2019, 12:50 PM  Pager:[phone]

## 2019-03-01 NOTE — Progress Notes (Signed)
PT Cancellation Note  Patient Details Name: Zachary [name] MRN: [id number] DOB: [birth year]   Cancelled Treatment:    Reason Eval/Treat Not Completed: Patient declined, no reason specified  Reports feeling tired and SOB and not up to moving. Will follow.  Marguarite Arbour A [name] 03/01/2019, 2:35 PM Wray Kearns, PT, DPT Acute Rehabilitation Services Pager [phone] Office [phone]

## 2019-03-01 NOTE — ED Notes (Signed)
Lunch tray ordered 

## 2019-03-01 NOTE — Progress Notes (Signed)
Pharmacy Antibiotic Note  Zachary [name] is a 85 y.o. male admitted on 03/01/2019 with pneumonia.  Pharmacy has been consulted for vancomycin dosing. Also with cefepime ordered x 1 per EDP. SCr 2.13 on admit.  Plan: Vancomycin 2g IV x 1; then Vancomycin 1000 mg IV Q 48 hrs. Goal AUC 400-550. Expected AUC: 445 SCr used: 2.13 Cefepime 2g IV x 1 per EDP - f/u if to continue Monitor clinical progress, c/s, renal function F/u de-escalation plan/LOT, vancomycin levels as indicated  ADDENDUM:  Pharmacy consulted to transition to Unasyn. Pt has received 1x dose of cefepime ordered this AM.  Plan: D/c vancomycin Start Unasyn 3g IV q12h Monitor clinical progress, c/s, renal function F/u de-escalation plan/LOT    Height: 5\' 7"  (170.2 cm) Weight: 212 lb (96.2 kg) IBW/kg (Calculated) : 66.1  Temp (24hrs), Avg:100.1 F (37.8 C), Min:99.8 F (37.7 C), Max:100.4 F (38 C)  Recent Labs  Lab 03/01/19 0612 03/01/19 0620 03/01/19 0810  WBC 4.9  --   --   CREATININE 2.13*  --   --   LATICACIDVEN  --  2.6* 2.1*    Estimated Creatinine Clearance: 27 mL/min (A) (by C-G formula based on SCr of 2.13 mg/dL (H)).    Allergies  Allergen Reactions  . Clindamycin/Lincomycin Rash  . Lincomycin Hcl Rash    Elicia Lamp, PharmD, BCPS Please check AMION for all [facility] contact numbers Clinical Pharmacist 03/01/2019 9:39 AM
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
## 2019-03-01 NOTE — ED Triage Notes (Signed)
Pt presents to ED from [facility]. Pt c/o SOB, CP. Pain is central, unchanging. Pt also R sided facial droop - hx of stroke. Per EMS unsure if this is residual. Pt also c/o leg pain. EMS VS HTN, tachy. Pt originally found at 82% RA.
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
## 2019-03-01 NOTE — Progress Notes (Signed)
Pharmacy Antibiotic Note  Zachary [name] is a 85 y.o. male admitted on 03/01/2019 with pneumonia.  Pharmacy has been consulted for vancomycin dosing. Also with cefepime ordered x 1 per EDP. SCr 2.13 on admit.  Plan: Vancomycin 2g IV x 1; then Vancomycin 1000 mg IV Q 48 hrs. Goal AUC 400-550. Expected AUC: 445 SCr used: 2.13 Cefepime 2g IV x 1 per EDP - f/u if to continue Monitor clinical progress, c/s, renal function F/u de-escalation plan/LOT, vancomycin levels as indicated   Height: 5\' 7"  (170.2 cm) Weight: 212 lb (96.2 kg) IBW/kg (Calculated) : 66.1  Temp (24hrs), Avg:100.1 F (37.8 C), Min:99.8 F (37.7 C), Max:100.4 F (38 C)  Recent Labs  Lab 03/01/19 0612 03/01/19 0620  WBC 4.9  --   CREATININE 2.13*  --   LATICACIDVEN  --  2.6*    Estimated Creatinine Clearance: 27 mL/min (A) (by C-G formula based on SCr of 2.13 mg/dL (H)).    Allergies  Allergen Reactions  . Clindamycin/Lincomycin Rash  . Lincomycin Hcl Rash    Elicia Lamp, PharmD, BCPS Please check AMION for all [facility] contact numbers Clinical Pharmacist 03/01/2019 8:36 AM

## 2019-03-01 NOTE — ED Notes (Signed)
Pharmacy contacted, secure code was given to this RN, waiting for the pt's dose of Methadone.

## 2019-03-02 ENCOUNTER — Inpatient Hospital Stay (HOSPITAL_COMMUNITY): Payer: Medicare Other

## 2019-03-02 LAB — BLOOD CULTURE ID PANEL (REFLEXED)

## 2019-03-02 LAB — CBC
HCT: 33.8 % — ABNORMAL LOW (ref 39.0–52.0)
Hemoglobin: 10.9 g/dL — ABNORMAL LOW (ref 13.0–17.0)
MCH: 31 pg (ref 26.0–34.0)
MCHC: 32.2 g/dL (ref 30.0–36.0)
MCV: 96 fL (ref 80.0–100.0)
Platelets: 122 10*3/uL — ABNORMAL LOW (ref 150–400)
RBC: 3.52 MIL/uL — ABNORMAL LOW (ref 4.22–5.81)
RDW: 14.3 % (ref 11.5–15.5)
WBC: 9.4 10*3/uL (ref 4.0–10.5)
nRBC: 0 % (ref 0.0–0.2)

## 2019-03-02 LAB — BASIC METABOLIC PANEL
Anion gap: 7 (ref 5–15)
BUN: 27 mg/dL — ABNORMAL HIGH (ref 8–23)
CO2: 24 mmol/L (ref 22–32)
Calcium: 8.2 mg/dL — ABNORMAL LOW (ref 8.9–10.3)
Chloride: 106 mmol/L (ref 98–111)
Creatinine, Ser: 2.06 mg/dL — ABNORMAL HIGH (ref 0.61–1.24)
GFR calc Af Amer: 33 mL/min — ABNORMAL LOW (ref >60)
GFR calc non Af Amer: 28 mL/min — ABNORMAL LOW (ref >60)
Glucose, Bld: 103 mg/dL — ABNORMAL HIGH (ref 70–99)
Potassium: 4 mmol/L (ref 3.5–5.1)
Sodium: 137 mmol/L (ref 135–145)

## 2019-03-02 NOTE — Progress Notes (Signed)
  Speech Language Pathology Treatment: Dysphagia  Patient Details Name: Zachary [name] MRN: [id number] DOB: 01-10-1930 Today's Date: 03/02/2019 Time: 8675-4492 SLP Time Calculation (min) (ACUTE ONLY): 18 min  Assessment / Plan / Recommendation Clinical Impression  Skilled treatment of intake with thin via cup/straw, puree and soft solids without overt s/s of aspiration noted during trial; esophagram completed 03/02/19 with results indicating potential distal esophageal stricture.  Minimal verbal cueing for swallowing precautions/strategies during tx session; pt denied any difficulty with pharyngeal clearance and did not exhibit any esophageal symptoms during trial (eg: multiple swallows, grimacing with swallowing/odynophagia, belching with intake, regurgitation, etc.); recommend Dysphagia 3 (mechanical soft)/thin liquids with esophageal precautions in place; ST will f/u for diet tolerance/education while in acute setting.    HPI HPI: Pt is an 85 yo male admitted from ALF with PNA. PMH: HTN, BPH, CKD 3, CVA in 2019, chronic pain, GERD, esophageal stricture, anxiety. Per chart, pt has had symptoms of dysphagia at least as far back as 2013, when an esophagram showed borderline esophageal dysmotility, small HH, intermittent tertiary contractions, and laryngeal penetration.       SLP Plan  Continue with current plan of care       Recommendations  Diet recommendations: Dysphagia 3 (mechanical soft);Thin liquid Liquids provided via: Cup Medication Administration: Crushed with puree Supervision: Patient able to self feed;Intermittent supervision to cue for compensatory strategies Compensations: Slow rate;Small sips/bites;Multiple dry swallows after each bite/sip;Other (Comment)(esophageal precautions) Postural Changes and/or Swallow Maneuvers: Seated upright 90 degrees;Upright 30-60 min after meal                Oral Care Recommendations: Oral care QID Follow up Recommendations: Other  (comment)(TBD) SLP Visit Diagnosis: Dysphagia, unspecified (R13.10) Plan: Continue with current plan of care                       Elvina Sidle, M.S., CCC-SLP 03/02/2019, 5:51 PM

## 2019-03-02 NOTE — Progress Notes (Signed)
   Subjective: Patient seen at the bedside around this morning.  The patient was just beginning a physical therapy session.  The patient says his bottom is sore from lying down so much and this is the first time he sat up.  He also says that he feels a little "foggy".  Feels very hungry.  No other complaints at this time.  Objective:  Vital signs in last 24 hours: Vitals:   03/01/19 1944 03/01/19 2100 03/02/19 0518 03/02/19 0716  BP:   (!) 140/57   Pulse:      Resp:      Temp:  97.9 F (36.6 C) 97.9 F (36.6 C)   TempSrc:  Oral Oral   SpO2: 95%   93%  Weight:      Height:       Physical Exam: General: Sitting up in bed.  NAD HEENT: NCAT CV: RRR, normal S1 and S2 no murmurs rubs or gallops appreciated PULM: Diminished breath sounds, crackles appreciated in the RLL field. NEURO: Alert and oriented x4, no focal deficits appreciated  Assessment/Plan:  Principal Problem:   Pneumonia Active Problems:   Essential hypertension   Dizziness   AKI (acute kidney injury) ([name])  In summary Zachary [name] is a 85 year old male with a history of HTN, BPH, CKD stage III, CVA in 2019 and chronic pain who presented from an ALF with a 1 to 2-week history of fatigue and dysphasia with solid foods. Work-up was significant for a a fever of 100.4 and a chest x-ray that revealed a right lower lung ill-defined opacity, concerning for aspiration pneumonia.   #Aspiration pneumonia: Patient initially presented with a fever of 100.4 and CXR significant for what appeared to be a developing RLL pneumonia. Given this finding in addition to difficulties with swallowing, the concern is he has aspiration pneumonia.  He was started on vancomycin and cefepime in the ED, but his abx were switched to Unasyn to cover for anaerobic organisms given the concern for aspiration.  - Continue Unasyn 3g q12hrs (day 2) - Continue azithromycin 500 mg daily - DuoNebs q6hrs PRN  - Swallow study ordered  #CKD stage III #AKI:  Baseline Cr 1.7-1.9, was 2.13 and appeared hypovolemic on exam when he first presented. Given history of dysphagia he likely has decreased intake overall. Cr today was 2.06.  -  #Hx CVA in 2019 #HTN: Was in lisinopril and torsemide 40 mg daily. Holding these for now. - Continue aspirin 81 mg and Plavix 75 mg daily - Continue atorvastatin 40 mg daily  #BPH - Flomax 0.4 mg daily - Finasteride 5 mg daily  #Chronic pain: On chronic pain medications at baseline.  - Methadone 5 mg TID -Tramadol 50 mg once daily as needed  #FEN/GI - Diet: NPO, ice chips and sips with meds - IVF: None  #VTE ppx: SQ Lovenox   #Code status: Patient would like [place] to talk to his sister, Zachary [name], to discuss this. Admitted as FULL CODE pending conversation with sister.   Dispo: Pending medical course.   Zachary Plater, MD Internal Medicine, PGY1 Pager: [phone]  03/02/2019,10:44 AM

## 2019-03-02 NOTE — Evaluation (Signed)
Physical Therapy Evaluation Patient Details Name: Zachary [name] MRN: [id number] DOB: 09-03-1929 Today's Date: 03/02/2019   History of Present Illness  Pt is an 85 yo male admitted from ALF with PNA. PMH: L hip THA, HTN, BPH, CKD 3, CVA in 2019, chronic pain, GERD, esophageal stricture, anxiety.  Clinical Impression  Pt admitted with above diagnosis. Pt needed min A for bed mobility as well as transfers and safe ambulation. Pt fatigued after short distance with RW, 10'. Educated pt on need for RW at this point as it gives greater stability than cane. SpO2 92-96% on RA throughout session.  Pt currently with functional limitations due to the deficits listed below (see PT Problem List). Pt will benefit from skilled PT to increase their independence and safety with mobility to allow discharge to the venue listed below.       Follow Up Recommendations Home health PT    Equipment Recommendations  None recommended by PT    Recommendations for Other Services       Precautions / Restrictions Precautions Precautions: Fall Restrictions Weight Bearing Restrictions: No      Mobility  Bed Mobility Overal bed mobility: Needs Assistance Bed Mobility: Supine to Sit     Supine to sit: Min assist     General bed mobility comments: min HHA and vc's as pt was unable to push self straight up in bed as he first attempted. Min HHA from SL to erect posture. Pt ablel to position self EOB  Transfers Overall transfer level: Needs assistance Equipment used: Rolling walker (2 wheeled) Transfers: Sit to/from Stand Sit to Stand: Min assist         General transfer comment: min A for power up and to steady, pt reported dizziness with initial standing. vc's for hand placement. RW used  Ambulation/Gait Ambulation/Gait assistance: Herbalist (Feet): 10 Feet Assistive device: Rolling walker (2 wheeled) Gait Pattern/deviations: Step-through pattern;Decreased stride length Gait velocity:  decreased Gait velocity interpretation: <1.31 ft/sec, indicative of household ambulator General Gait Details: educated in stability that RW provides compared to cane while he was ambulating with RW. Pt verbally agreeable. Noted fatigue with ambulation. SpO2 remained 92% on RA  Stairs            Wheelchair Mobility    Modified Rankin (Stroke Patients Only)       Balance Overall balance assessment: Needs assistance;History of Falls Sitting-balance support: No upper extremity supported Sitting balance-Leahy Scale: Fair     Standing balance support: Bilateral upper extremity supported;During functional activity Standing balance-Leahy Scale: Poor Standing balance comment: unsafe in standing without UE support                             Pertinent Vitals/Pain Pain Assessment: Faces Faces Pain Scale: Hurts even more Pain Location: buttocks, abdomen, L hip Pain Descriptors / Indicators: Discomfort;Aching Pain Intervention(s): Limited activity within patient's tolerance;Monitored during session    Home Living Family/patient expects to be discharged to:: Assisted living               Home Equipment: Walker - 2 wheels;Cane - single point Additional Comments: [facility]    Prior Function Level of Independence: Needs assistance   Gait / Transfers Assistance Needed: pt reports he uses his cane for ambulation but has not ambulated out of his apt in quite some time  ADL's / Homemaking Assistance Needed: reports independence with bathing and dressing, meals are  provided  Comments: pt reports he has a RW and knows it would be safer but he sees people with RW and doesn't want to be like that     Hand Dominance   Dominant Hand: Right    Extremity/Trunk Assessment   Upper Extremity Assessment Upper Extremity Assessment: Generalized weakness    Lower Extremity Assessment Lower Extremity Assessment: Generalized weakness    Cervical / Trunk  Assessment Cervical / Trunk Assessment: Kyphotic  Communication   Communication: No difficulties  Cognition Arousal/Alertness: Lethargic Behavior During Therapy: WFL for tasks assessed/performed Overall Cognitive Status: Within Functional Limits for tasks assessed                                 General Comments: pt reports feeling "foggy" but is generally intact and able to answer all questions of orientation as well as some higher level executive functions. Responses are slightly delayed      General Comments General comments (skin integrity, edema, etc.): BP 106/53 in supine, 152/73 in sitting. SpO2 92-96% on RA throughout session    Exercises General Exercises - Lower Extremity Ankle Circles/Pumps: AROM;Both;20 reps;Supine   Assessment/Plan    PT Assessment Patient needs continued PT services  PT Problem List Decreased strength;Decreased activity tolerance;Decreased balance;Decreased mobility;Decreased knowledge of use of DME;Decreased safety awareness;Pain       PT Treatment Interventions DME instruction;Gait training;Functional mobility training;Therapeutic activities;Therapeutic exercise;Balance training;Patient/family education    PT Goals (Current goals can be found in the Care Plan section)  Acute Rehab PT Goals Patient Stated Goal: get something to eat PT Goal Formulation: With patient Time For Goal Achievement: 03/16/19 Potential to Achieve Goals: Good    Frequency Min 3X/week   Barriers to discharge Decreased caregiver support ALF    Co-evaluation               AM-PAC PT "6 Clicks" Mobility  Outcome Measure Help needed turning from your back to your side while in a flat bed without using bedrails?: A Little Help needed moving from lying on your back to sitting on the side of a flat bed without using bedrails?: A Little Help needed moving to and from a bed to a chair (including a wheelchair)?: A Little Help needed standing up from a  chair using your arms (e.g., wheelchair or bedside chair)?: A Little Help needed to walk in hospital room?: A Little Help needed climbing 3-5 steps with a railing? : A Lot 6 Click Score: 17    End of Session Equipment Utilized During Treatment: Gait belt Activity Tolerance: Patient tolerated treatment well Patient left: in chair;with call bell/phone within reach Nurse Communication: Mobility status PT Visit Diagnosis: Unsteadiness on feet (R26.81);Repeated falls (R29.6);Muscle weakness (generalized) (M62.81)    Time: 4496-7591 PT Time Calculation (min) (ACUTE ONLY): 34 min   Charges:   PT Evaluation $PT Eval Moderate Complexity: 1 Mod PT Treatments $Gait Training: 8-22 mins        Leighton Roach, [place]  Pager [phone] Office [place] 03/02/2019, 10:45 AM

## 2019-03-02 NOTE — Progress Notes (Signed)
  Date: 03/02/2019  Patient name: Zachary [name]  Medical record number: [id number]  Date of birth: 07-16-1929   This patient's plan of care was discussed with the house staff. Please see Dr. Redgie Grayer note for complete details. I concur with his findings.   Sid Falcon, MD 03/02/2019, 12:42 PM

## 2019-03-02 NOTE — Evaluation (Addendum)
Occupational Therapy Evaluation Patient Details Name: Zachary [name] MRN: [id number] DOB: 07/12/1929 Today's Date: 03/02/2019    History of Present Illness Pt is an 85 yo male admitted from ALF with PNA. PMH: L hip THA, HTN, BPH, CKD 3, CVA in 2019, chronic pain, GERD, esophageal stricture, anxiety.   Clinical Impression   This 85 y/o male presents with the above. PTA pt was living in ALF, reports mod independence with ADL and functional mobility using SPC, reports facility provides meal and assists with medication management. Pt currently presenting with decreased balance, endurance, and generalized weakness. Pt requiring minA for short distance mobility in room using RW this session; he currently requires setup assist for seated grooming and UB ADL, at least minA for LB ADL. VSS with pt on RA during session. He will benefit from continued acute OT services and recommend follow up [facility] services after discharge to maximize his safety and independence with ADL and mobility. Encouraged pt up OOB daily for meals while admitted. Will follow.     Follow Up Recommendations  Home health OT;Supervision/Assistance - 24 hour(24hr initially)    Equipment Recommendations  None recommended by OT           Precautions / Restrictions Precautions Precautions: Fall Restrictions Weight Bearing Restrictions: No      Mobility Bed Mobility Overal bed mobility: Needs Assistance Bed Mobility: Supine to Sit;Sit to Supine     Supine to sit: Min assist Sit to supine: Min guard   General bed mobility comments: minA for trunk elevation; pt able to return to supine with guarding for safety/lines  Transfers Overall transfer level: Needs assistance Equipment used: Rolling walker (2 wheeled) Transfers: Sit to/from Stand Sit to Stand: Min assist         General transfer comment: min A for power up and to steady, pt reported dizziness with initial standing. vc's for hand placement. RW used     Balance Overall balance assessment: Needs assistance;History of Falls Sitting-balance support: No upper extremity supported Sitting balance-Leahy Scale: Fair     Standing balance support: Bilateral upper extremity supported;During functional activity Standing balance-Leahy Scale: Poor Standing balance comment: unsafe in standing without UE support                           ADL either performed or assessed with clinical judgement   ADL Overall ADL's : Needs assistance/impaired Eating/Feeding: NPO   Grooming: Wash/dry face;Set up;Sitting Grooming Details (indicate cue type and reason): and applying lotion Upper Body Bathing: Set up;Min guard;Sitting   Lower Body Bathing: Minimal assistance;Sit to/from stand   Upper Body Dressing : Set up;Min guard;Sitting   Lower Body Dressing: Minimal assistance;Sit to/from stand Lower Body Dressing Details (indicate cue type and reason): minA for standing balance Toilet Transfer: Minimal assistance;Ambulation;RW Toilet Transfer Details (indicate cue type and reason): simulated via transfer to/from EOB, short distance mobility in room with RW Toileting- Clothing Manipulation and Hygiene: Minimal assistance;Sit to/from stand       Functional mobility during ADLs: Minimal assistance;Rolling walker       Vision         Perception     Praxis      Pertinent Vitals/Pain Pain Assessment: Faces Faces Pain Scale: No hurt Pain Intervention(s): Monitored during session     Hand Dominance Right   Extremity/Trunk Assessment Upper Extremity Assessment Upper Extremity Assessment: Generalized weakness   Lower Extremity Assessment Lower Extremity Assessment: Defer to PT evaluation  Cervical / Trunk Assessment Cervical / Trunk Assessment: Kyphotic   Communication Communication Communication: No difficulties   Cognition Arousal/Alertness: Awake/alert Behavior During Therapy: WFL for tasks assessed/performed Overall  Cognitive Status: Within Functional Limits for tasks assessed                                 General Comments: cognition appears generally intact for basic tasks today; able to answer questions appropriately and appears accurate    General Comments  VSS with pt on RA    Exercises     Shoulder Instructions      Home Living Family/patient expects to be discharged to:: Assisted living                             Home Equipment: Walker - 2 wheels;Cane - single point;Shower seat   Additional Comments: [facility]      Prior Functioning/Environment Level of Independence: Needs assistance  Gait / Transfers Assistance Needed: pt reports he uses his cane for ambulation but has not ambulated out of his apt in quite some time ADL's / Homemaking Assistance Needed: reports independence with bathing and dressing, meals are provided Communication / Swallowing Assistance Needed: pt has had dysphagia  Comments: pt reports he has a RW and knows it would be safer but he sees people with RW and doesn't want to be like that        OT Problem List: Decreased strength;Decreased range of motion;Decreased activity tolerance;Impaired balance (sitting and/or standing);Decreased knowledge of use of DME or AE      OT Treatment/Interventions: Self-care/ADL training;Therapeutic exercise;Neuromuscular education;Energy conservation;DME and/or AE instruction;Therapeutic activities;Patient/family education;Balance training    OT Goals(Current goals can be found in the care plan section) Acute Rehab OT Goals Patient Stated Goal: get something to eat OT Goal Formulation: With patient Time For Goal Achievement: 03/16/19 Potential to Achieve Goals: Good  OT Frequency: Min 2X/week   Barriers to D/C:            Co-evaluation              AM-PAC OT "6 Clicks" Daily Activity     Outcome Measure Help from another person eating meals?: None Help from another  person taking care of personal grooming?: A Little Help from another person toileting, which includes using toliet, bedpan, or urinal?: A Little Help from another person bathing (including washing, rinsing, drying)?: A Little Help from another person to put on and taking off regular upper body clothing?: None Help from another person to put on and taking off regular lower body clothing?: A Little 6 Click Score: 20   End of Session Equipment Utilized During Treatment: Rolling walker Nurse Communication: Mobility status  Activity Tolerance: Patient tolerated treatment well Patient left: in bed;with call bell/phone within reach;with bed alarm set  OT Visit Diagnosis: Muscle weakness (generalized) (M62.81);Unsteadiness on feet (R26.81)                Time: 2297-9892 OT Time Calculation (min): 25 min Charges:  OT General Charges $OT Visit: 1 Visit OT Evaluation $OT Eval Moderate Complexity: 1 Mod OT Treatments $Self Care/Home Management : 8-22 mins  Lou Cal, OT Supplemental Rehabilitation Services Pager [phone] Office [place] 03/02/2019, 3:59 PM

## 2019-03-02 NOTE — Progress Notes (Signed)
PHARMACY - PHYSICIAN COMMUNICATION CRITICAL VALUE ALERT - BLOOD CULTURE IDENTIFICATION (BCID)  Zachary [name] is an 85 y.o. male who presented to [facility] on 03/01/2019 with a chief complaint of shaking, dizziness, cough  Assessment:  On Unasyn for possible aspiration PNA  Name of physician (or Provider) Contacted: Dr. Sheppard Coil ([facility])  Current antibiotics: Unasyn, Azithromycin   Changes to prescribed antibiotics recommended:  Strep species possible contaminant  Cont Unasyn for aspiration PNA - will provide some coverage if true pathogen  Results for orders placed or performed during the hospital encounter of 03/01/19  Blood Culture ID Panel (Reflexed) (Collected: 03/01/2019  8:48 AM)  Result Value Ref Range   Enterococcus species NOT DETECTED NOT DETECTED   Listeria monocytogenes NOT DETECTED NOT DETECTED   Staphylococcus species NOT DETECTED NOT DETECTED   Staphylococcus aureus (BCID) NOT DETECTED NOT DETECTED   Streptococcus species DETECTED (A) NOT DETECTED   Streptococcus agalactiae NOT DETECTED NOT DETECTED   Streptococcus pneumoniae NOT DETECTED NOT DETECTED   Streptococcus pyogenes NOT DETECTED NOT DETECTED   Acinetobacter baumannii NOT DETECTED NOT DETECTED   Enterobacteriaceae species NOT DETECTED NOT DETECTED   Enterobacter cloacae complex NOT DETECTED NOT DETECTED   Escherichia coli NOT DETECTED NOT DETECTED   Klebsiella oxytoca NOT DETECTED NOT DETECTED   Klebsiella pneumoniae NOT DETECTED NOT DETECTED   Proteus species NOT DETECTED NOT DETECTED   Serratia marcescens NOT DETECTED NOT DETECTED   Haemophilus influenzae NOT DETECTED NOT DETECTED   Neisseria meningitidis NOT DETECTED NOT DETECTED   Pseudomonas aeruginosa NOT DETECTED NOT DETECTED   Candida albicans NOT DETECTED NOT DETECTED   Candida glabrata NOT DETECTED NOT DETECTED   Candida krusei NOT DETECTED NOT DETECTED   Candida parapsilosis NOT DETECTED NOT DETECTED   Candida tropicalis NOT DETECTED NOT  DETECTED    Narda Bonds 03/02/2019  7:05 AM

## 2019-03-03 LAB — CBC
HCT: 31 % — ABNORMAL LOW (ref 39.0–52.0)
Hemoglobin: 10.3 g/dL — ABNORMAL LOW (ref 13.0–17.0)
MCH: 31.3 pg (ref 26.0–34.0)
MCHC: 33.2 g/dL (ref 30.0–36.0)
MCV: 94.2 fL (ref 80.0–100.0)
Platelets: 134 10*3/uL — ABNORMAL LOW (ref 150–400)
RBC: 3.29 MIL/uL — ABNORMAL LOW (ref 4.22–5.81)
RDW: 14.2 % (ref 11.5–15.5)
WBC: 8.8 10*3/uL (ref 4.0–10.5)
nRBC: 0 % (ref 0.0–0.2)

## 2019-03-03 LAB — BASIC METABOLIC PANEL
Anion gap: 6 (ref 5–15)
BUN: 27 mg/dL — ABNORMAL HIGH (ref 8–23)
CO2: 25 mmol/L (ref 22–32)
Calcium: 8.3 mg/dL — ABNORMAL LOW (ref 8.9–10.3)
Chloride: 106 mmol/L (ref 98–111)
Creatinine, Ser: 1.85 mg/dL — ABNORMAL HIGH (ref 0.61–1.24)
GFR calc Af Amer: 37 mL/min — ABNORMAL LOW (ref >60)
GFR calc non Af Amer: 32 mL/min — ABNORMAL LOW (ref >60)
Glucose, Bld: 100 mg/dL — ABNORMAL HIGH (ref 70–99)
Potassium: 4 mmol/L (ref 3.5–5.1)
Sodium: 137 mmol/L (ref 135–145)

## 2019-03-03 MED ORDER — AMOXICILLIN-POT CLAVULANATE 875-125 MG PO TABS
1.0000 | ORAL_TABLET | Freq: Two times a day (BID) | ORAL | Status: DC
  Administered 2019-03-03 – 2019-03-04 (×2): 1 via ORAL
  Filled 2019-03-03 (×3): qty 1

## 2019-03-03 NOTE — Progress Notes (Addendum)
   Subjective: Pt seen at the bedside on rounds this AM.  Patient said he is frustrated that he did not realize he had to order breakfast in order for food to come to his room.  He says he is very hungry right now.  Otherwise he feels okay and does not have any other complaints.  We discussed transitioning him from IV to oral antibiotics today.  Patient says he is confident he can swallow pills.  Objective:  Vital signs in last 24 hours: Vitals:   03/02/19 2256 03/03/19 0434 03/03/19 0636 03/03/19 0802  BP:  (!) 157/68  (!) 161/73  Pulse: 83 72  66  Resp: 20 (!) 21  20  Temp:  98.4 F (36.9 C)  98 F (36.7 C)  TempSrc:  Oral  Oral  SpO2: 90% 94%  (!) 16%  Weight:   101 kg   Height:       Physical exam: General: Sitting up in bed watching TV, NAD HEENT: NCAT CV: RRR, normal S1-S2, no murmurs rubs or gallops appreciated PULM: Mild crackles and wheezes in the RLL field.  Clear to auscultation otherwise. NEURO: Alert and oriented x4, no focal deficits  Assessment/Plan:  Principal Problem:   Pneumonia Active Problems:   Essential hypertension   Dizziness   AKI (acute kidney injury) ([place])  In summary Zachary [name] is a 85 year old male with a history of HTN, BPH, CKD stage III, CVA in 2019 and chronic pain who presented from an ALF with a 1 to 2-week history of fatigue and dysphasia with solid foods. Work-up was significant for a a fever of 100.4 and a chest x-ray that revealed a right lower lung ill-defined opacity, concerning for aspiration pneumonia.   #Aspiration pneumonia:Patient initially presented with a fever of 100.4 and CXR significant for what appeared to be a developing RLL pneumonia. Given this finding in addition to difficulties with swallowing, the concern is he has aspiration pneumonia. He was started on vancomycin and cefepimein the ED, but his abx were switched to Unasyn to cover for anaerobic organisms given the concern for aspiration.  - D/c Unasyn, start  Augmentin 875 mg BID  - Continue azithromycin 250 mg daily - DuoNebs q6hrs PRN - Swallow study ordered  #CKD stage III #AKI: Baseline Cr 1.7-1.9, was 2.13 and appeared hypovolemic on exam when he first presented. Given history of dysphagia he likely had decreased intake overall. Pt on liquid diet now and tolerating well. Cr today was 1.85.   #Hx CVA in 2019 #HTN: Was in lisinopril and torsemide 40 mg daily. Holding these for now. - Continue aspirin 81 mg and Plavix 75 mg daily - Continue atorvastatin 40 mg daily  #BPH - Flomax 0.4 mg daily - Finasteride 5 mg daily  #Chronic pain:On chronic pain medications at baseline.  - Methadone 5 mg TID - Tramadol 50 mg once daily as needed  #FEN/GI - Diet: NPO, ice chips and sips with meds - IVF: None  #VTE ppx:SQ Lovenox   #Code status: Patient would like [place] to talk to his sister, Zachary [name], to discuss this. Admitted as FULL CODE pending conversation with sister.  #Dispo: Anticipated discharge tomorrow, assuming the patient tolerates oral antibiotics today.   [name], MD Internal Medicine, PGY1 Pager: [phone]  03/03/2019,12:14 PM

## 2019-03-03 NOTE — Progress Notes (Signed)
  Date: 03/03/2019  Patient name: Zachary [name]  Medical record number: [id number]  Date of birth: 11/02/1929        I have seen and evaluated this patient and I have discussed the plan of care with the house staff. Please see Dr. Redgie Grayer note for complete details. I concur with his findings and plan.   Sid Falcon, MD 03/03/2019, 7:24 PM

## 2019-03-04 ENCOUNTER — Telehealth: Payer: Self-pay | Admitting: Internal Medicine

## 2019-03-04 DIAGNOSIS — B955 Unspecified streptococcus as the cause of diseases classified elsewhere: Secondary | ICD-10-CM

## 2019-03-04 DIAGNOSIS — B952 Enterococcus as the cause of diseases classified elsewhere: Secondary | ICD-10-CM

## 2019-03-04 LAB — BASIC METABOLIC PANEL
Anion gap: 8 (ref 5–15)
BUN: 25 mg/dL — ABNORMAL HIGH (ref 8–23)
CO2: 23 mmol/L (ref 22–32)
Calcium: 8.4 mg/dL — ABNORMAL LOW (ref 8.9–10.3)
Chloride: 105 mmol/L (ref 98–111)
Creatinine, Ser: 1.66 mg/dL — ABNORMAL HIGH (ref 0.61–1.24)
GFR calc Af Amer: 42 mL/min — ABNORMAL LOW (ref >60)
GFR calc non Af Amer: 37 mL/min — ABNORMAL LOW (ref >60)
Glucose, Bld: 108 mg/dL — ABNORMAL HIGH (ref 70–99)
Potassium: 4.3 mmol/L (ref 3.5–5.1)
Sodium: 136 mmol/L (ref 135–145)

## 2019-03-04 LAB — CBC
HCT: 34.8 % — ABNORMAL LOW (ref 39.0–52.0)
Hemoglobin: 11.2 g/dL — ABNORMAL LOW (ref 13.0–17.0)
MCH: 30.4 pg (ref 26.0–34.0)
MCHC: 32.2 g/dL (ref 30.0–36.0)
MCV: 94.3 fL (ref 80.0–100.0)
Platelets: 165 10*3/uL (ref 150–400)
RBC: 3.69 MIL/uL — ABNORMAL LOW (ref 4.22–5.81)
RDW: 13.8 % (ref 11.5–15.5)
WBC: 8.2 10*3/uL (ref 4.0–10.5)
nRBC: 0 % (ref 0.0–0.2)

## 2019-03-04 LAB — CULTURE, BLOOD (ROUTINE X 2)

## 2019-03-04 LAB — SARS CORONAVIRUS 2 (TAT 6-24 HRS): SARS Coronavirus 2: NEGATIVE

## 2019-03-04 MED ORDER — ENSURE ENLIVE PO LIQD
237.0000 mL | Freq: Three times a day (TID) | ORAL | Status: DC
  Administered 2019-03-04 – 2019-03-15 (×26): 237 mL via ORAL
  Filled 2019-03-04 (×2): qty 237

## 2019-03-04 MED ORDER — AMOXICILLIN-POT CLAVULANATE 875-125 MG PO TABS: 1.0000 | ORAL_TABLET | Freq: Two times a day (BID) | ORAL | 0 refills | Status: DC

## 2019-03-04 MED ORDER — METHADONE HCL 5 MG PO TABS: 5.0000 mg | ORAL_TABLET | Freq: Three times a day (TID) | ORAL | 0 refills | Status: DC

## 2019-03-04 MED ORDER — SODIUM CHLORIDE 0.9 % IV SOLN
2.0000 g | INTRAVENOUS | Status: DC
  Administered 2019-03-04 – 2019-03-05 (×2): 2 g via INTRAVENOUS
  Filled 2019-03-04: qty 2
  Filled 2019-03-04: qty 20
  Filled 2019-03-04: qty 2

## 2019-03-04 MED ORDER — AZITHROMYCIN 250 MG PO TABS: 250.0000 mg | ORAL_TABLET | Freq: Every day | ORAL | 0 refills | Status: DC

## 2019-03-04 MED ORDER — ENSURE IMMUNE HEALTH PO LIQD: 237.0000 mL | Freq: Three times a day (TID) | ORAL | Status: DC

## 2019-03-04 MED ORDER — LISINOPRIL 20 MG PO TABS
20.0000 mg | ORAL_TABLET | Freq: Every day | ORAL | Status: DC
  Administered 2019-03-04 – 2019-03-11 (×8): 20 mg via ORAL
  Filled 2019-03-04: qty 1
  Filled 2019-03-04: qty 2
  Filled 2019-03-04 (×7): qty 1

## 2019-03-04 MED ORDER — FUROSEMIDE 40 MG PO TABS
40.0000 mg | ORAL_TABLET | Freq: Every day | ORAL | Status: DC
  Administered 2019-03-04 – 2019-03-11 (×8): 40 mg via ORAL
  Filled 2019-03-04 (×9): qty 1

## 2019-03-04 MED ORDER — TRAMADOL HCL 50 MG PO TABS: 50.0000 mg | ORAL_TABLET | Freq: Every day | ORAL | 3 refills | Status: DC | PRN

## 2019-03-04 NOTE — Telephone Encounter (Signed)
[facility] per Dr Mitzi Hansen ; pt appt 09/21 115pm

## 2019-03-04 NOTE — Progress Notes (Signed)
Occupational Therapy Treatment Patient Details Name: Zachary [name] MRN: [id number] DOB: [birth year] Today's Date: 03/04/2019    History of present illness Pt is an 85 yo male admitted from ALF with PNA. PMH: L hip THA, HTN, BPH, CKD 3, CVA in 2019, chronic pain, GERD, esophageal stricture, anxiety.   OT comments  Pt assisted OOB to sink for grooming and ambulated to chair with min assist. Pt with SOB with minimal exertion, but Sp02 at 92%. Pt does not feel prepared to discharge. Pt has been to [facility] in the past for rehab and may want to go back.  Follow Up Recommendations  Home health OT;Supervision/Assistance - 24 hour(at ALF)    Equipment Recommendations  None recommended by OT    Recommendations for Other Services      Precautions / Restrictions Precautions Precautions: Fall Precaution Comments: SOB with minimal exertion       Mobility Bed Mobility Overal bed mobility: Needs Assistance Bed Mobility: Supine to Sit     Supine to sit: Min assist     General bed mobility comments: assist to raise trunk  Transfers Overall transfer level: Needs assistance Equipment used: Rolling walker (2 wheeled) Transfers: Sit to/from Stand Sit to Stand: Min assist         General transfer comment: assist to rise     Balance Overall balance assessment: Needs assistance;History of Falls   Sitting balance-Leahy Scale: Fair     Standing balance support: Bilateral upper extremity supported Standing balance-Leahy Scale: Poor                             ADL either performed or assessed with clinical judgement   ADL Overall ADL's : Needs assistance/impaired Eating/Feeding: Independent;Bed level   Grooming: Wash/dry hands;Min guard;Standing               Lower Body Dressing: Total assistance;Bed level Lower Body Dressing Details (indicate cue type and reason): pt claims he does not don his socks at baseline Toilet Transfer: Minimal  assistance;Ambulation;RW           Functional mobility during ADLs: Minimal assistance;Rolling walker       Vision       Perception     Praxis      Cognition Arousal/Alertness: Awake/alert Behavior During Therapy: WFL for tasks assessed/performed Overall Cognitive Status: Within Functional Limits for tasks assessed                                          Exercises     Shoulder Instructions       General Comments      Pertinent Vitals/ Pain       Pain Assessment: Faces Faces Pain Scale: Hurts a little bit Pain Location: LEs with lotion on feet Pain Descriptors / Indicators: Sore Pain Intervention(s): Monitored during session;Repositioned  Home Living                                          Prior Functioning/Environment              Frequency  Min 2X/week        Progress Toward Goals  OT Goals(current goals can now be found in the care plan section)  Progress  towards OT goals: Progressing toward goals  Acute Rehab OT Goals Patient Stated Goal: get my strength back OT Goal Formulation: With patient Time For Goal Achievement: 03/16/19 Potential to Achieve Goals: Good  Plan Discharge plan remains appropriate    Co-evaluation                 AM-PAC OT "6 Clicks" Daily Activity     Outcome Measure   Help from another person eating meals?: None Help from another person taking care of personal grooming?: A Little Help from another person toileting, which includes using toliet, bedpan, or urinal?: A Little Help from another person bathing (including washing, rinsing, drying)?: A Lot Help from another person to put on and taking off regular upper body clothing?: A Little Help from another person to put on and taking off regular lower body clothing?: Total 6 Click Score: 16    End of Session Equipment Utilized During Treatment: Gait belt;Rolling walker  OT Visit Diagnosis: Muscle weakness  (generalized) (M62.81);Unsteadiness on feet (R26.81)   Activity Tolerance Patient tolerated treatment well   Patient Left in chair;with call bell/phone within reach   Nurse Communication Mobility status        Time: 8657-8469 OT Time Calculation (min): 25 min  Charges: OT General Charges $OT Visit: 1 Visit OT Treatments $Self Care/Home Management : 23-37 mins  Nestor Lewandowsky, OTR/L Acute Rehabilitation Services Pager: [phone] Office: [phone]   Malka So 03/04/2019, 9:53 AM

## 2019-03-04 NOTE — Progress Notes (Addendum)
Physical Therapy Treatment Patient Details Name: Zachary [name] MRN: [id number] DOB: [birth year] Today's Date: 03/04/2019    History of Present Illness Pt is an 85 yo male admitted from ALF with PNA. PMH: L hip THA, HTN, BPH, CKD 3, CVA in 2019, chronic pain, GERD, esophageal stricture, anxiety.    PT Comments    Pt sitting up in recliner on entry, eager to return to bed. Pt is min A for power up to RW due to hips being too far back in chair. Pt minA for initial steadying with RW, pt with c/o of dizziness which dissipated and pt able to continue to ambulate with min guard back to other side of bed. Pt requires min A for return of R LE to bed. D/c plans ultimately appropriate however pt reports "feeling lousy" and is fatigued with mobility today. PT recommends R/W for discharge.    Follow Up Recommendations  Home health PT     Equipment Recommendations  Rolling walker with 5" wheels       Precautions / Restrictions Precautions Precautions: Fall Restrictions Weight Bearing Restrictions: No    Mobility  Bed Mobility Overal bed mobility: Needs Assistance Bed Mobility: Supine to Sit;Sit to Supine       Sit to supine: Min assist   General bed mobility comments: up in chair on entry, requires min A for management of R LE into bed   Transfers Overall transfer level: Needs assistance Equipment used: Rolling walker (2 wheeled) Transfers: Sit to/from Stand Sit to Stand: Min assist         General transfer comment: min A for power up and steadying   Ambulation/Gait Ambulation/Gait assistance: Min assist;Min guard Gait Distance (Feet): 15 Feet Assistive device: Rolling walker (2 wheeled) Gait Pattern/deviations: Step-through pattern;Decreased stride length Gait velocity: slowed Gait velocity interpretation: <1.31 ft/sec, indicative of household ambulator General Gait Details: min A for initial steadying, progressed to min guard, vc for sequending with RW           Balance Overall balance assessment: Needs assistance;History of Falls Sitting-balance support: No upper extremity supported Sitting balance-Leahy Scale: Fair     Standing balance support: Bilateral upper extremity supported;During functional activity Standing balance-Leahy Scale: Poor Standing balance comment: requires UE assist                            Cognition Arousal/Alertness: Awake/alert Behavior During Therapy: WFL for tasks assessed/performed Overall Cognitive Status: Within Functional Limits for tasks assessed                                           General Comments General comments (skin integrity, edema, etc.): VSS, SaO2 on RA .93%O2,       Pertinent Vitals/Pain Pain Assessment: Faces Faces Pain Scale: Hurts little more Pain Location: B LE from where edema has been reduced, overall feels lousy though Pain Descriptors / Indicators: Aching;Sore Pain Intervention(s): Limited activity within patient's tolerance;Monitored during session;Repositioned           PT Goals (current goals can now be found in the care plan section) Acute Rehab PT Goals Patient Stated Goal: get something to eat PT Goal Formulation: With patient Time For Goal Achievement: 03/16/19 Potential to Achieve Goals: Good Progress towards PT goals: Not progressing toward goals - comment(pt with increased fatigue and "feeling lousy")  Frequency    Min 3X/week      PT Plan Equipment recommendations need to be updated       AM-PAC PT "6 Clicks" Mobility   Outcome Measure  Help needed turning from your back to your side while in a flat bed without using bedrails?: None Help needed moving from lying on your back to sitting on the side of a flat bed without using bedrails?: None Help needed moving to and from a bed to a chair (including a wheelchair)?: A Little Help needed standing up from a chair using your arms (e.g., wheelchair or bedside chair)?: A  Little Help needed to walk in hospital room?: A Little Help needed climbing 3-5 steps with a railing? : A Lot 6 Click Score: 19    End of Session Equipment Utilized During Treatment: Gait belt Activity Tolerance: Patient limited by fatigue Patient left: in bed;with call bell/phone within reach;with bed alarm set Nurse Communication: Mobility status PT Visit Diagnosis: Unsteadiness on feet (R26.81);Repeated falls (R29.6);Muscle weakness (generalized) (M62.81)     Time: 1120-1140 PT Time Calculation (min) (ACUTE ONLY): 20 min  Charges:  $Gait Training: 8-22 mins                     Chuong Casebeer B. Migdalia Dk PT, DPT Acute Rehabilitation Services Pager [phone] Office [phone]    [place] 03/04/2019, 2:16 PM

## 2019-03-04 NOTE — Progress Notes (Addendum)
   Subjective: Patient seen at the bedside this morning on rounds.  Says he has having "shaking spells" nightly and he does not know why.  Says last night it happened around 3 or 4 AM.  Reports that it scares him. Otherwise, patient has no complaints at this time.  Denies SOB or chest pain.  Objective:  Vital signs in last 24 hours: Vitals:   03/03/19 2116 03/03/19 2117 03/04/19 0548 03/04/19 0754  BP:  (!) 148/67  (!) 173/77  Pulse:  71  69  Resp:  (!) 24    Temp: 98.4 F (36.9 C)  98.3 F (36.8 C) 98.9 F (37.2 C)  TempSrc: Oral  Oral Oral  SpO2:  93%    Weight:      Height:       Physical exam: General: Sitting up in bed watching TV, NAD HEENT: NCAT CV: RRR, normal S1-S2, no murmurs rubs or gallops appreciated.  No lower extremity edema noted PULM: Mild crackles and wheezes in the RLL field. Clear to auscultation otherwise. NEURO: Alert and oriented x4, no focal deficits  Assessment/Plan:  Principal Problem:   Pneumonia Active Problems:   Essential hypertension   Dizziness   AKI (acute kidney injury) ([place])   In summary Zachary [name] is a 85 year old male with a history of HTN, BPH, CKD stage III, CVA in 2019 and chronic pain who presented from an ALF with a 1 to 2-week history of fatigue and dysphasia with solid foods. Work-up was significant for a a fever of 100.4 and a chest x-ray that revealed a right lower lung ill-defined opacity, concerning for aspiration pneumonia.  The patient has tolerated the transition from Unasyn to Augmentin well and reports no trouble swallowing.  #Aspiration pneumonia: Patient initially presented with a fever of 100.4 and CXR significant for what appeared to be a developing RLL pneumonia. Given this finding in addition to difficulties with swallowing, the concern is he has aspiration pneumonia. He was started on vancomycin and cefepime in the ED, but his abx were switched to Unasyn to cover for anaerobic organisms given the concern for  aspiration.   Continue Augmentin 875 mg BID for 7 day course of abx (day 2/7)   DuoNebs q6hrs PRN  #CKD stage III #AKI: Baseline Cr 1.7-1.9, was 2.13 and appeared hypovolemic on exam when he first presented. Given history of dysphagia he likely had decreased intake overall. Pt on liquid diet now and tolerating well. Cr today was 1.85.    #Hx CVA in 2019 #HTN: Was in lisinopril and torsemide 40 mg daily. Will restart upon d/c  Continue aspirin 81 mg and Plavix 75 mg daily  Continue atorvastatin 40 mg daily   #BPH  Flomax 0.4 mg daily  Finasteride 5 mg daily   #Chronic pain: On chronic pain medications at baseline.   Methadone 5 mg TID  Tramadol 50 mg once daily PRN   #FEN/GI  Diet: NPO, ice chips and sips with meds  IVF: None  #VTE ppx: SQ Lovenox    #Code status: Full   #Dispo: Anticipated discharge today.   [name], MD Internal Medicine, PGY1 Pager: [phone]  03/04/2019,9:35 AM

## 2019-03-04 NOTE — TOC Initial Note (Signed)
Transition of Care [facility]) - Initial/Assessment Note    Patient Details  Name: Zachary [name] MRN: [id number] Date of Birth: 06/02/1930  Transition of Care [facility]) CM/SW Contact:    Zachary [name], [place] Phone Number: 03/04/2019, 11:08 AM  Clinical Narrative:                 CSW met with pt at bedside. Introduced self, role, reason for visit. Pt from [facility] and has lived there for "about 4 years." Pt has had PT/OT at [facility] before. We discussed that MD team has consulted CSW because pt may be ready for dc today; pt unhappy with this as he states he is not ready to leave yet "they havent done anything for me!"  CSW has left message with [name] to make them aware of possible dc today and will f/u if I have not heard back from them. FL2 done, negative COVID on 9/13.   Expected Discharge Plan: Assisted Living(w home health) Barriers to Discharge: Continued Medical Work up   Patient Goals and CMS Choice Patient states their goals for this hospitalization and ongoing recovery are:: to have some answers about how I feel CMS Medicare.gov Compare Post Acute Care list provided to:: (n/a) Choice offered to / list presented to : Patient  Expected Discharge Plan and Services Expected Discharge Plan: Assisted Living(w home health) In-house Referral: Clinical Social Work Discharge Planning Services: CM Consult Post Acute Care Choice: Home Health, Resumption of Svcs/PTA Provider Living arrangements for the past 2 months: Assisted Living Facility HH Arranged: PT, OT [place] Agency: [place]  Prior Living Arrangements/Services Living arrangements for the past 2 months: [place] Lives with:: Facility Resident Patient language and need for interpreter reviewed:: Yes(no needs) Do you feel safe going back to the place where you live?: Yes      Need for Family Participation in Patient Care: Yes (Comment)(assistance with ADL/IADLs) Care giver support system  in place?: Yes (comment)(facility resident) Current home services: Home OT, Home PT Criminal Activity/Legal Involvement Pertinent to Current Situation/Hospitalization: No - Comment as needed  Activities of Daily Living Home Assistive Devices/Equipment: Cane (specify quad or straight), Walker (specify type) ADL Screening (condition at time of admission) Patient's cognitive ability adequate to safely complete daily activities?: No Is the patient deaf or have difficulty hearing?: Yes Does the patient have difficulty seeing, even when wearing glasses/contacts?: No Does the patient have difficulty concentrating, remembering, or making decisions?: No Patient able to express need for assistance with ADLs?: Yes Does the patient have difficulty dressing or bathing?: No Independently performs ADLs?: Yes (appropriate for developmental age) Does the patient have difficulty walking or climbing stairs?: Yes Weakness of Legs: Both Weakness of Arms/Hands: None  Permission Sought/Granted Permission sought to share information with : Facility Sport and exercise psychologist, Family Supports Permission granted to share information with : Yes, Verbal Permission Granted  Share Information with NAME: Zachary [name]  Permission granted to share info w AGENCY: [place] granted to share info w Relationship: sister  Permission granted to share info w Contact Information: [phone]  Emotional Assessment Appearance:: Appears stated age Attitude/Demeanor/Rapport: Engaged, Self-Confident Affect (typically observed): Appropriate, Blunt, Frustrated Orientation: : Oriented to Self, Oriented to Place, Oriented to  Time, Oriented to Situation Alcohol / Substance Use: Not Applicable Psych Involvement: No (comment)  Admission diagnosis:  Knee pain [M25.569] HCAP (healthcare-associated pneumonia) [J18.9] Severe comorbid illness [R69] Patient Active Problem List   Diagnosis Date Noted  . Pneumonia  03/01/2019  .  AKI (acute kidney injury) ([place]) 03/01/2019  . Cerebral thrombosis with cerebral infarction 12/17/2017  . Hyperlipidemia   . Vertigo 12/16/2017  . Dry eyes 05/01/2017  . Senile purpura ([place]) 07/18/2016  . Aortic atherosclerosis ([place]) 02/26/2015  . Chronic kidney disease, stage 3 ([place]) 11/08/2013  . Allergic rhinitis 09/13/2013  . Osteoarthritis of left knee 03/07/2013  . Swelling of lower extremity 12/07/2012  . Gastritis 07/06/2012  . Obesity (BMI 30.0-34.9) 07/06/2012  . Squamous cell skin cancer 07/06/2012  . Healthcare maintenance 07/06/2012  . Constipation due to pain medication 06/03/2011  . Arthritis of hip 05/21/2010  . Dizziness 10/30/2006  . Anxiety 06/22/2006  . Major depressive disorder, recurrent severe without psychotic features ([place]) 06/22/2006  . Chronic pain disorder 06/22/2006  . Essential hypertension 06/22/2006  . Gastroesophageal reflux disease with stricture 06/22/2006  . Diverticulosis of colon 06/22/2006  . Benign prostatic hypertrophy with urinary obstruction 06/22/2006  . Facial basal cell cancer 06/22/2006   PCP:  Jean Rosenthal, MD Pharmacy:  No Pharmacies Listed    Social Determinants of Health (SDOH) Interventions    Readmission Risk Interventions No flowsheet data found.

## 2019-03-04 NOTE — Plan of Care (Signed)
Ambulated to chair with therapy today.   Patient reporting that he feels something in his upper chest, and that he doesn't feel any better than he did up on admission.  No complaints of pain today, +BM.  Will continue to monitor.

## 2019-03-04 NOTE — NC FL2 (Addendum)
[place] LEVEL OF CARE SCREENING TOOL     IDENTIFICATION  Patient Name: Zachary [name] Birthdate: [birth year] Sex: male Admission Date (Current Location): 03/01/2019  [place] and [place] Number:  [name] [phone] [place] and Address:  [facility], [place] [address] [place], [place] [address]      Provider Number: [id number]  Attending Physician Name and Address:  Axel Filler, *  Relative Name and Phone Number:  Valli Glance; sister, [phone]    Current Level of Care: Hospital Recommended Level of Care: [place] Prior Approval Number:    Date Approved/Denied:   PASRR Number: [id number]  Discharge Plan: Other (Comment)(Assisted Living Facility)    Current Diagnoses: Patient Active Problem List   Diagnosis Date Noted  . Pneumonia 03/01/2019  . AKI (acute kidney injury) ([place]) 03/01/2019  . Cerebral thrombosis with cerebral infarction 12/17/2017  . Hyperlipidemia   . Vertigo 12/16/2017  . Dry eyes 05/01/2017  . Senile purpura ([place]) 07/18/2016  . Aortic atherosclerosis ([place]) 02/26/2015  . Chronic kidney disease, stage 3 ([place]) 11/08/2013  . Allergic rhinitis 09/13/2013  . Osteoarthritis of left knee 03/07/2013  . Swelling of lower extremity 12/07/2012  . Gastritis 07/06/2012  . Obesity (BMI 30.0-34.9) 07/06/2012  . Squamous cell skin cancer 07/06/2012  . Healthcare maintenance 07/06/2012  . Constipation due to pain medication 06/03/2011  . Arthritis of hip 05/21/2010  . Dizziness 10/30/2006  . Anxiety 06/22/2006  . Major depressive disorder, recurrent severe without psychotic features ([place]) 06/22/2006  . Chronic pain disorder 06/22/2006  . Essential hypertension 06/22/2006  . Gastroesophageal reflux disease with stricture 06/22/2006  . Diverticulosis of colon 06/22/2006  . Benign prostatic hypertrophy with urinary obstruction 06/22/2006  . Facial basal cell cancer 06/22/2006    Orientation  RESPIRATION BLADDER Height & Weight     Self, Time, Situation, Place  Normal Continent Weight: 222 lb 10.6 oz (101 kg)(with scale bed on 6C-01) Height:  5\' 7"  (170.2 cm)  BEHAVIORAL SYMPTOMS/MOOD NEUROLOGICAL BOWEL NUTRITION STATUS      Continent Diet(mechanical soft; thin liquids)  AMBULATORY STATUS COMMUNICATION OF NEEDS Skin   Limited Assist Verbally Skin abrasions(abrasions on bilateral arms)                       Personal Care Assistance Level of Assistance  Bathing, Feeding, Dressing Bathing Assistance: Limited assistance Feeding assistance: Independent Dressing Assistance: Limited assistance     Functional Limitations Info  Hearing, Sight, Speech Sight Info: Adequate Hearing Info: Adequate Speech Info: Adequate    SPECIAL CARE FACTORS FREQUENCY  OT (By licensed OT), PT (By licensed PT)     PT Frequency: 3x week OT Frequency: 2x week            Contractures Contractures Info: Not present    Additional Factors Info  Code Status, Allergies, Psychotropic Code Status Info: Full Code Allergies Info: Clindamycin/lincomycin, Lincomycin Hcl Psychotropic Info: mirtazapine (REMERON) tablet 30 mg daily at bedtime PO; risperiDONE (RISPERDAL) tablet 0.25 mg daily PO; sertraline (ZOLOFT) tablet 150 mg 2x daily PO         Current Medications (03/04/2019):  This is the current hospital active medication list Current Facility-Administered Medications  Medication Dose Route Frequency Provider Last Rate Last Dose  . acetaminophen (TYLENOL) tablet 650 mg  650 mg Oral Q6H PRN Welford Roche, MD   650 mg at 03/03/19 0042   Or  . acetaminophen (TYLENOL) suppository 650 mg  650 mg Rectal Q6H  PRN Welford Roche, MD      . albuterol (PROVENTIL) (2.5 MG/3ML) 0.083% nebulizer solution 2.5 mg  2.5 mg Nebulization Q6H PRN Axel Filler, MD      . amoxicillin-clavulanate (AUGMENTIN) 875-125 MG per tablet 1 tablet  1 tablet Oral Q12H [name], MD   1  tablet at 03/04/19 [phone]  . aspirin EC tablet 81 mg  81 mg Oral Daily Welford Roche, MD   81 mg at 03/04/19 0919  . atorvastatin (LIPITOR) tablet 40 mg  40 mg Oral q1800 Welford Roche, MD   40 mg at 03/03/19 1758  . azithromycin (ZITHROMAX) tablet 250 mg  250 mg Oral Daily Welford Roche, MD   250 mg at 03/04/19 0920  . clopidogrel (PLAVIX) tablet 75 mg  75 mg Oral Daily Welford Roche, MD   75 mg at 03/04/19 0920  . enoxaparin (LOVENOX) injection 40 mg  40 mg Subcutaneous Q24H Welford Roche, MD   40 mg at 03/04/19 0921  . finasteride (PROSCAR) tablet 5 mg  5 mg Oral Daily Welford Roche, MD   5 mg at 03/04/19 0919  . fluticasone (FLONASE) 50 MCG/ACT nasal spray 2 spray  2 spray Each Nare Daily Welford Roche, MD   2 spray at 03/04/19 0921  . LORazepam (ATIVAN) tablet 0.5 mg  0.5 mg Oral Q8H PRN Welford Roche, MD   0.5 mg at 03/04/19 0920  . methadone (DOLOPHINE) tablet 5 mg  5 mg Oral Q8H Santos-Sanchez, Idalys, MD   5 mg at 03/04/19 0542  . mirtazapine (REMERON) tablet 30 mg  30 mg Oral QHS Welford Roche, MD   30 mg at 03/03/19 2124  . pantoprazole (PROTONIX) EC tablet 40 mg  40 mg Oral Daily Welford Roche, MD   40 mg at 03/04/19 0919  . polyvinyl alcohol (LIQUIFILM TEARS) 1.4 % ophthalmic solution 2 drop  2 drop Both Eyes TID Axel Filler, MD   2 drop at 03/03/19 2126  . risperiDONE (RISPERDAL) tablet 0.25 mg  0.25 mg Oral BID Welford Roche, MD   0.25 mg at 03/04/19 0920  . senna-docusate (Senokot-S) tablet 2 tablet  2 tablet Oral BID Welford Roche, MD   2 tablet at 03/04/19 0920  . sertraline (ZOLOFT) tablet 150 mg  150 mg Oral Daily Welford Roche, MD   150 mg at 03/04/19 0919  . sodium chloride flush (NS) 0.9 % injection 3 mL  3 mL Intravenous Q12H Welford Roche, MD   3 mL at 03/04/19 0934  . tamsulosin (FLOMAX) capsule 0.4 mg  0.4 mg Oral Daily Santos-Sanchez,  Idalys, MD   0.4 mg at 03/04/19 0919  . traMADol (ULTRAM) tablet 50 mg  50 mg Oral Daily PRN Welford Roche, MD         Discharge Medications: TAKE these medications   acetaminophen 325 MG tablet Commonly known as: TYLENOL Take 650 mg by mouth every 8 (eight) hours as needed for headache.   AmLactin 12 % lotion Generic drug: ammonium lactate Apply 1 application topically 2 (two) times daily as needed for dry skin (apply to both legs and feet).   amoxicillin-clavulanate 875-125 MG tablet Commonly known as: AUGMENTIN Take 1 tablet by mouth every 12 (twelve) hours for 9 doses.   aspirin 81 MG EC tablet Take 1 tablet (81 mg total) by mouth daily. Swallow whole.   atorvastatin 40 MG tablet Commonly known as: LIPITOR Take 40 mg by mouth at bedtime.   azithromycin 250 MG tablet Commonly known as: ZITHROMAX Take  1 tablet (250 mg total) by mouth daily.   Bandage Roll 3"x75" Misc Use as directed   Biofreeze 4 % Gel Generic drug: Menthol (Topical Analgesic) Apply 1 application topically every 8 (eight) hours as needed (pain (right lower chest)).   carboxymethylcellulose 0.5 % Soln Commonly known as: REFRESH PLUS Place 1 drop into both eyes 3 (three) times daily.   clopidogrel 75 MG tablet Commonly known as: PLAVIX Take 1 tablet (75 mg total) by mouth daily.   diclofenac sodium 1 % Gel Commonly known as: VOLTAREN Apply 4 g topically 4 (four) times daily as needed.   feeding supplement Liqd Take 237 mLs by mouth 3 (three) times daily with meals.   finasteride 5 MG tablet Commonly known as: Proscar Take 1 tablet (5 mg total) by mouth daily.   fluticasone 50 MCG/ACT nasal spray Commonly known as: FLONASE Place 2 sprays into both nostrils daily.   furosemide 40 MG tablet Commonly known as: LASIX Take 1 tablet (40 mg total) by mouth daily.   levocetirizine 5 MG tablet Commonly known as: XYZAL Take 5 mg by mouth at bedtime.   lisinopril 20 MG  tablet Commonly known as: ZESTRIL Take 1 tablet (20 mg total) by mouth daily.   LORazepam 0.5 MG tablet Commonly known as: ATIVAN Take 0.5 mg by mouth See admin instructions. Take one tablet (0.5 mg) by mouth three times daily, may also take one tablet (0.5 mg) every 12 hours as needed for anxiety   methadone 5 MG tablet Commonly known as: DOLOPHINE Take 1 tablet (5 mg total) by mouth every 8 (eight) hours for 30 days. What changed: additional instructions   mirtazapine 30 MG tablet Commonly known as: REMERON Take 30 mg by mouth at bedtime.   omeprazole 20 MG capsule Commonly known as: PRILOSEC Take 1 capsule (20 mg total) by mouth daily.   polyethylene glycol powder 17 GM/SCOOP powder Commonly known as: GLYCOLAX/MIRALAX Take 17 g by mouth daily.   risperiDONE 0.25 MG tablet Commonly known as: RISPERDAL Take 0.25 mg by mouth 2 (two) times daily.   rosuvastatin 20 MG tablet Commonly known as: CRESTOR Take 1 tablet (20 mg total) by mouth daily at 6 PM.   senna 8.6 MG tablet Commonly known as: SENOKOT Take 3 tablets by mouth at bedtime.   sertraline 100 MG tablet Commonly known as: ZOLOFT Take 100 mg by mouth at bedtime. Take with 50 mg tablet for a total dose of 150 mg at bedtime   sertraline 50 MG tablet Commonly known as: ZOLOFT Take 50 mg by mouth at bedtime. Take with 100 mg tablet for a total dose of 150 mg at bedtime   Simethicone 125 MG Caps Take 1 capsule (125 mg total) by mouth 4 (four) times daily as needed. What changed:   when to take this  reasons to take this   Tab-A-Vite Tabs Take 1 tablet by mouth daily.   tamsulosin 0.4 MG Caps capsule Commonly known as: Flomax Take 1 capsule (0.4 mg total) by mouth daily.   traMADol 50 MG tablet Commonly known as: ULTRAM Take 1 tablet (50 mg total) by mouth daily as needed for moderate pain. What changed: when to take this   wound dressings gel Apply topically as needed for wound care.      Relevant Imaging Results:  Relevant Lab Results:   Additional Information SS# [id number]; COVID negative on 9/13  [place], [place]

## 2019-03-04 NOTE — Discharge Instructions (Signed)

## 2019-03-04 NOTE — Discharge Summary (Addendum)
Name: Zachary [name] MRN: [id number] DOB: 1930-02-21 85 y.o. PCP: Jean Rosenthal, MD  Date of Admission: 03/01/2019  5:52 AM Date of Discharge:   03/15/2019 Attending Physician: Aldine Contes, MD  Discharge Diagnosis: 1. Aspiration pneumonia 2. Step bovis bacteremia  3. AKI  Discharge Medications: Allergies as of 03/15/2019       Reactions   Clindamycin/lincomycin Rash   Lincomycin Hcl Rash        Medication List     STOP taking these medications    traMADol 50 MG tablet Commonly known as: ULTRAM       TAKE these medications    acetaminophen 325 MG tablet Commonly known as: TYLENOL Take 650 mg by mouth every 8 (eight) hours as needed for headache.   AmLactin 12 % lotion Generic drug: ammonium lactate Apply 1 application topically 2 (two) times daily as needed for dry skin (apply to both legs and feet).   amLODipine 10 MG tablet Commonly known as: NORVASC Take 1 tablet (10 mg total) by mouth daily. Start taking on: March 16, 2019   amoxicillin 500 MG capsule Commonly known as: AMOXIL Take 2 capsules (1,000 mg total) by mouth 3 (three) times daily.   aspirin 81 MG EC tablet Take 1 tablet (81 mg total) by mouth daily. Swallow whole.   atorvastatin 40 MG tablet Commonly known as: LIPITOR Take 40 mg by mouth at bedtime.   Bandage Roll 3"x75" Misc Use as directed   Biofreeze 4 % Gel Generic drug: Menthol (Topical Analgesic) Apply 1 application topically every 8 (eight) hours as needed (pain (right lower chest)).   carboxymethylcellulose 0.5 % Soln Commonly known as: REFRESH PLUS Place 1 drop into both eyes 3 (three) times daily.   clopidogrel 75 MG tablet Commonly known as: PLAVIX Take 1 tablet (75 mg total) by mouth daily.   diclofenac sodium 1 % Gel Commonly known as: VOLTAREN Apply 4 g topically 4 (four) times daily as needed.   feeding supplement Liqd Take 237 mLs by mouth 3 (three) times daily with meals.   ferrous gluconate 324  MG tablet Commonly known as: FERGON Take 1 tablet (324 mg total) by mouth daily with breakfast. Start taking on: March 16, 2019   finasteride 5 MG tablet Commonly known as: Proscar Take 1 tablet (5 mg total) by mouth daily.   fluticasone 50 MCG/ACT nasal spray Commonly known as: FLONASE Place 2 sprays into both nostrils daily.   furosemide 40 MG tablet Commonly known as: LASIX Take 1 tablet (40 mg total) by mouth daily.   levocetirizine 5 MG tablet Commonly known as: XYZAL Take 5 mg by mouth at bedtime.   lisinopril 20 MG tablet Commonly known as: ZESTRIL Take 1 tablet (20 mg total) by mouth daily.   LORazepam 0.5 MG tablet Commonly known as: ATIVAN Take 1 tablet (0.5 mg total) by mouth See admin instructions. Take one tablet (0.5 mg) by mouth three times daily, may also take one tablet (0.5 mg) every 12 hours as needed for anxiety   methadone 5 MG tablet Commonly known as: DOLOPHINE Take 1 tablet (5 mg total) by mouth every 8 (eight) hours. What changed: additional instructions   mirtazapine 30 MG tablet Commonly known as: REMERON Take 30 mg by mouth at bedtime.   omeprazole 20 MG capsule Commonly known as: PRILOSEC Take 1 capsule (20 mg total) by mouth daily.   polyethylene glycol powder 17 GM/SCOOP powder Commonly known as: GLYCOLAX/MIRALAX Take 17 g by mouth daily.  risperiDONE 0.25 MG tablet Commonly known as: RISPERDAL Take 0.25 mg by mouth 2 (two) times daily.   rosuvastatin 20 MG tablet Commonly known as: CRESTOR Take 1 tablet (20 mg total) by mouth daily at 6 PM.   senna 8.6 MG tablet Commonly known as: SENOKOT Take 3 tablets by mouth at bedtime.   sertraline 100 MG tablet Commonly known as: ZOLOFT Take 100 mg by mouth at bedtime. Take with 50 mg tablet for a total dose of 150 mg at bedtime   sertraline 50 MG tablet Commonly known as: ZOLOFT Take 50 mg by mouth at bedtime. Take with 100 mg tablet for a total dose of 150 mg at bedtime    Simethicone 125 MG Caps Take 1 capsule (125 mg total) by mouth 4 (four) times daily as needed. What changed:  when to take this reasons to take this   Tab-A-Vite Tabs Take 1 tablet by mouth daily.   tamsulosin 0.4 MG Caps capsule Commonly known as: Flomax Take 1 capsule (0.4 mg total) by mouth daily.   wound dressings gel Apply topically as needed for wound care.               Durable Medical Equipment  (From admission, onward)           Start     Ordered   03/15/19 1219  For home use only DME Walker rolling  [facility])  Once    Question:  Patient needs a walker to treat with the following condition  Answer:  Physical deconditioning   03/15/19 1229           Disposition and follow-up:   Zachary [name] was discharged from [facility] in fair condition. At the hospital follow up visit please address:  1. Aspiration pneumonia: Resolved. Presented w temp of 100.4 and a chest x-ray that revealed a RLL opacity. Pt reported recent swallowing issues prior to admission as well. Started on IV Unasyn & for 2 days then transitioned to Augmentin and Azithro.  Esophagram was performed, details below.  2. Strep bovis bacteremia: One of the blood culture grew back Strep bovis, raising concerns for endocarditis or GI malignancy. Patient was started on ceftriaxone subsequently after finishing Augmentin and Azithro. Cardiology and GI were consulted. Colonoscopy showed no evidence of malignancy.  TEE was significant for a mobile echogenic structure on the pulmonary valve. Endocarditis could not be excluded. We plan for 6 weeks of IV antibiotics, however the patient refused a PICC line.  Patient was willing to take oral antibiotics instead. The patient was discharged with oral Amoxicillin.   Amoxicillin 1000 mg TID (stop date 10/29)   3. AKI: Baseline Cr 1.7-1.9, was 2.13 which resolved with some IVF and increased PO intake over the course of the hospitalization. Cr  1.44 on day of discharge.    4. Hyperkalemia: K+ at 5.4 on the day of discharge. Please check with BMP. Restarted home Lasix 40 mg daily.   5. Labs / imaging needed at time of follow-up: CBC, BMP to check potassium   6. Pending labs/ test needing follow-up: none  Follow-up Appointments:  [facility] [place], [place] [address]  Please schedule follow-up appointment either Monday or Tuesday of the upcoming week.   Hospital Course by problem list: 1. Aspiration pneumonia: 85 year old male with a history of HTN, BPH, CKD stage III, CVA in 2019 and chronic pain who presented from an  ALF with a 1 to 2-week history of fatigue and dysphasia with solid foods. Work-up was significant for a a fever of 100.4 and a chest x-ray that revealed a right lower lung ill-defined opacity, concerning for aspiration pneumonia. The patient was started on IV Unasyn and for 2 days then transitioned to Augmentin and Azithromycin with no problems. Esophogram was performed while he was here, details are below in the procedure section.  2. Strep bovis bacteremia: Patient had a blood culture that came back positive for Strep bovis, raising concerns for a possible endocarditis and/or GI malignancy. Patient was started on ceftriaxone. Cardiology and GI were consulted for TEE and colonoscopy. TEE showed mobile echogenic structure on the pulmonary valve, endocarditis could not be ruled out.  The patient decided he did not want a PICC line for IV antibiotics but was amendable to oral abx.  Amoxicillin 1000 mg 3 times daily (stop date 10/29)  3. AKI: Baseline Cr 1.7-1.9, was 2.13 and appeared hypovolemic on exam when he first presented. Given history of dysphagia he likely had decreased intake overall. Pt on liquid diet now and tolerating well. Cr today was 1.44 on day of discharge (at baseline).  4. Hyperkalemia: K+ 5.4 on day of discharge. Will restart Lasix 40 mg  daily. Plan to check BMP at outpatient follow up.   Discharge Vitals:   BP (!) 173/77 (BP Location: Right Arm)   Pulse 69   Temp 98.9 F (37.2 C) (Oral)   Resp (!) 24   Ht 5\' 7"  (1.702 m)   Wt 101 kg Comment: with scale bed on 6C-01  SpO2 93%   BMI 34.87 kg/m   Pertinent Labs, Studies, and Procedures:  CBC Latest Ref Rng & Units 03/04/2019 03/03/2019 03/02/2019  WBC 4.0 - 10.5 K/uL 8.2 8.8 9.4  Hemoglobin 13.0 - 17.0 g/dL 11.2(L) 10.3(L) 10.9(L)  Hematocrit 39.0 - 52.0 % 34.8(L) 31.0(L) 33.8(L)  Platelets 150 - 400 K/uL 165 134(L) 122(L)   BMP Latest Ref Rng & Units 03/04/2019 03/03/2019 03/02/2019  Glucose 70 - 99 mg/dL 108(H) 100(H) 103(H)  BUN 8 - 23 mg/dL 25(H) 27(H) 27(H)  Creatinine 0.61 - 1.24 mg/dL 1.66(H) 1.85(H) 2.06(H)  BUN/Creat Ratio 10 - 24 - - -  Sodium 135 - 145 mmol/L 136 137 137  Potassium 3.5 - 5.1 mmol/L 4.3 4.0 4.0  Chloride 98 - 111 mmol/L 105 106 106  CO2 22 - 32 mmol/L 23 25 24   Calcium 8.9 - 10.3 mg/dL 8.4(L) 8.3(L) 8.2(L)   CXR: IMPRESSION: Low volume chest with atelectasis or pneumonia in the infrahilar Lungs  Esophogram: IMPRESSION: 1. Potential distal esophageal stricture although this could not be completely assessed due to the inability to place the patient upright. Repeat esophagram after resolution of acute symptoms so the patient can participate with upright positioning is recommended to further evaluate. Alternatively, endoscopy could be used to further assess. 2. Otherwise no gross abnormality identified.  TEE: IMPRESSIONS 1. Left ventricular ejection fraction, by visual estimation, is 60 to 65%. The left ventricle has normal function. Normal left ventricular size. There is no left ventricular hypertrophy. 2. Global right ventricle has normal systolic function.The right ventricular size is normal. No increase in right ventricular wall thickness. 3. Mild pulmonic regurgitation; there is both a central jet and an eccentric  anterior-directed jet. There is a small, mobile echogenic structure on the pulmonary artery side of the valve. Cannot exclude vegetation. 4. The mitral valve is normal in structure. Mild mitral valve regurgitation. 5. The  tricuspid valve is normal in structure. Tricuspid valve regurgitation was not visualized by color flow Doppler. 6. The aortic valve is tricuspid Aortic valve regurgitation was not visualized by color flow Doppler.  Discharge Instructions:    Signed: [name], MD Internal Medicine, PGY1 Pager: [phone]  03/14/2019,6:04 PM

## 2019-03-04 NOTE — Social Work (Signed)
CSW messaged MD Dr. Sheppard Coil, pt refusing discharge today. Will update ALF.  Westley Hummer, MSW, [place] Work [phone]

## 2019-03-04 NOTE — Progress Notes (Signed)
Patient transferred via wheelchair to 6N-10, all patient belongings and medications transferred with patient.  Report given to receiving RN.

## 2019-03-04 NOTE — Progress Notes (Signed)
  Speech Language Pathology Treatment: Dysphagia  Patient Details Name: Zachary [name] MRN: [id number] DOB: 1929/07/24 Today's Date: 03/04/2019 Time: 1008-1030 SLP Time Calculation (min) (ACUTE ONLY): 22 min  Assessment / Plan / Recommendation Clinical Impression  Pt was encountered awake/alert sitting upright in recliner chair and he was pleasant and cooperative throughout this tx session.  Pt was observed with po trials from his breakfast meal tray (chopped french toast & water via straw sip). Pt was observed to have an intermittent baseline congested cough upon SLP arrival; however, it did not appear to be related to po intake and no clinical s/sx of aspiration were observed with trials of thin liquid or Dysphagia 3 solids.  Pt demonstrated timely mastication of solids with trace oral residue that he was sensate to and cleared with an independent liquid wash.  Pt was re-educated regarding safe swallow strategies including sitting upright, taking small bites/sips, etc.  Pt verbalized understanding with teach back.  Recommend continuation of a Dysphagia 3 (mech soft)/Thin liquid diet at this time.     HPI HPI: Pt is an 85 yo male admitted from ALF with PNA. PMH: HTN, BPH, CKD 3, CVA in 2019, chronic pain, GERD, esophageal stricture, anxiety. Per chart, pt has had symptoms of dysphagia at least as far back as 2013, when an esophagram showed borderline esophageal dysmotility, small HH, intermittent tertiary contractions, and laryngeal penetration.       SLP Plan  Continue with current plan of care       Recommendations  Diet recommendations: Dysphagia 3 (mechanical soft);Thin liquid Liquids provided via: Cup;Straw Medication Administration: Crushed with puree Supervision: Patient able to self feed;Intermittent supervision to cue for compensatory strategies Compensations: Slow rate;Small sips/bites;Multiple dry swallows after each bite/sip;Other (Comment) Postural Changes and/or Swallow  Maneuvers: Seated upright 90 degrees;Upright 30-60 min after meal                Oral Care Recommendations: Oral care BID Follow up Recommendations: Other (comment)(tbd) SLP Visit Diagnosis: Dysphagia, unspecified (R13.10) Plan: Continue with current plan of care       Bretta Bang, M.S., [place] Office: [phone]    [place] 03/04/2019, 10:34 AM

## 2019-03-05 ENCOUNTER — Inpatient Hospital Stay (HOSPITAL_BASED_OUTPATIENT_CLINIC_OR_DEPARTMENT_OTHER): Payer: Medicare Other

## 2019-03-05 DIAGNOSIS — R7881 Bacteremia: Secondary | ICD-10-CM | POA: Diagnosis not present

## 2019-03-05 DIAGNOSIS — B954 Other streptococcus as the cause of diseases classified elsewhere: Secondary | ICD-10-CM

## 2019-03-05 LAB — CBC
HCT: 34.2 % — ABNORMAL LOW (ref 39.0–52.0)
Hemoglobin: 11.8 g/dL — ABNORMAL LOW (ref 13.0–17.0)
MCH: 31.8 pg (ref 26.0–34.0)
MCHC: 34.5 g/dL (ref 30.0–36.0)
MCV: 92.2 fL (ref 80.0–100.0)
Platelets: 155 10*3/uL (ref 150–400)
RBC: 3.71 MIL/uL — ABNORMAL LOW (ref 4.22–5.81)
RDW: 13.6 % (ref 11.5–15.5)
WBC: 7 10*3/uL (ref 4.0–10.5)
nRBC: 0 % (ref 0.0–0.2)

## 2019-03-05 LAB — BASIC METABOLIC PANEL
Anion gap: 11 (ref 5–15)
BUN: 26 mg/dL — ABNORMAL HIGH (ref 8–23)
CO2: 22 mmol/L (ref 22–32)
Calcium: 8.4 mg/dL — ABNORMAL LOW (ref 8.9–10.3)
Chloride: 103 mmol/L (ref 98–111)
Creatinine, Ser: 1.45 mg/dL — ABNORMAL HIGH (ref 0.61–1.24)
GFR calc Af Amer: 50 mL/min — ABNORMAL LOW (ref >60)
GFR calc non Af Amer: 43 mL/min — ABNORMAL LOW (ref >60)
Glucose, Bld: 109 mg/dL — ABNORMAL HIGH (ref 70–99)
Potassium: 4.1 mmol/L (ref 3.5–5.1)
Sodium: 136 mmol/L (ref 135–145)

## 2019-03-05 LAB — ECHOCARDIOGRAM COMPLETE
Height: 67 in
Weight: 3516.78 oz

## 2019-03-05 MED ORDER — PERFLUTREN LIPID MICROSPHERE
1.0000 mL | INTRAVENOUS | Status: AC | PRN
  Administered 2019-03-05: 2 mL via INTRAVENOUS
  Filled 2019-03-05: qty 10

## 2019-03-05 NOTE — Progress Notes (Signed)
   Subjective: Patient seen at the bedside this morning on rounds.Says he did have an episode of shakes and chills overnight. We explained to the patient that his blood cultures grew strep bovis and this could be a sign of endocarditis or GI malignancy. Explained we will obtain echocardiogram today and he will eventually need a colonoscopy in the outpatient setting. Patient voiced understanding and asked [place] to call his sister.  Objective:  Vital signs in last 24 hours: Vitals:   03/04/19 1700 03/04/19 1753 03/04/19 1955 03/05/19 0456  BP:   (!) 194/84 (!) 177/85  Pulse:   68 81  Resp:   (!) 24 15  Temp: (!) 97.3 F (36.3 C) (!) 97.3 F (36.3 C) 98.5 F (36.9 C) 98.9 F (37.2 C)  TempSrc: Oral Oral Oral Oral  SpO2: 94%  96% 96%  Weight:   99.7 kg   Height:   5\' 7"  (1.702 m)    Physical exam: General: Laying in bed watching TV, NAD HEENT: NCAT CV: RRR, normal S1-S2, no murmurs rubs or gallops appreciated. No lower extremity edema noted PULM: Clear to auscultation bilaterally NEURO: Alert and oriented x4, no focal deficits  Assessment/Plan:  Principal Problem:   Pneumonia Active Problems:   Essential hypertension   Dizziness   AKI (acute kidney injury) ([place])   Streptococcal bacteremia  In summary Zachary [name] is a 85 year old male with a history of HTN, BPH, CKD stage III, CVA in 2019 and chronic pain who presented from an ALF with a 1 to 2-week history of fatigue and dysphasia with solid foods. Pt was treated for aspiration pneumonia and course was complicated by Strep bovis + cultures, raising concerns for endocarditis and malignancy.   #Aspiration pneumonia #Strep bovis bacteremia: Pt has been treated for presumed aspiration pneumonia with Augmentin.  However, his blood cultures grew Group D Strep, raising concerns for potential endocarditis or malignancy. Patient endorses having nighttime "shakes"which may be Reiger's consistent with a dwelling infection. - Obtain TTE.   Consider TEE if TTE he is unremarkable - Continue ceftriaxone (day 4 of antibiotics) - Will need outpatient follow-up for colonoscopy -DuoNebs every 6 hours PRN  #CKD stage III #AKI: Baseline Cr 1.7-1.9, was 2.13 and appeared hypovolemic on exam when he first presented. Given history of dysphagia he likely had decreased intake overall. Pt on liquid diet now and tolerating well. Cr today was 1.45.    #Hx CVA in 2019 #HTN - Lisinopril 20 mg daily - aspirin 81 mg and Plavix 75 mg daily - atorvastatin 40 mg daily   #BPH - Flomax 0.4 mg daily - Finasteride 5 mg daily   #Chronic pain: On chronic pain medications at baseline.  - Methadone 5 mg TID - Tramadol 50 mg once daily PRN   #FEN/GI - Diet: NPO, ice chips and sips with meds - IVF: None  #VTE ppx: SQ Lovenox    #Code status: Full   #Dispo: Pending medical course  [name], MD Internal Medicine, PGY1 Pager: [phone]  03/05/2019,6:36 AM

## 2019-03-05 NOTE — TOC Progression Note (Addendum)
Transition of Care [facility]) - Progression Note    Patient Details  Name: Zachary [name] MRN: [id number] Date of Birth: 1930-05-20  Transition of Care [facility]) CM/SW [place], [place] Phone Number: 03/05/2019, 10:52 AM  Clinical Narrative: 11:02am- Spoke with pt MD, Dr. Sheppard Coil, aware pt will remain inpatient for further medical work up. Have updated pt ALF and will provide further assist as needed.     10:52am- Following for pt stability for discharge. CSW reached out to [place] at [employer]. Pt paperwork sent yesterday has been received. Will still need FL2 addendum filled out, script for Ativan, and updated summary/orders if dc today.    Expected Discharge Plan: Assisted Living(w home health) Barriers to Discharge: Continued Medical Work up  Expected Discharge Plan and Services Expected Discharge Plan: Assisted Living(w home health) In-house Referral: Clinical Social Work Discharge Planning Services: CM Consult Post Acute Care Choice: Home Health, Resumption of Svcs/PTA Provider Living arrangements for the past 2 months: [place] Expected Discharge Date: 03/04/19                HH Arranged: PT, OT HH Agency: [place]         Social Determinants of Health (SDOH) Interventions    Readmission Risk Interventions No flowsheet data found.

## 2019-03-05 NOTE — Progress Notes (Signed)
  Echocardiogram 2D Echocardiogram has been performed.  Zachary [name] 03/05/2019, 2:31 PM

## 2019-03-06 DIAGNOSIS — Z1211 Encounter for screening for malignant neoplasm of colon: Secondary | ICD-10-CM

## 2019-03-06 DIAGNOSIS — R7881 Bacteremia: Secondary | ICD-10-CM

## 2019-03-06 DIAGNOSIS — R1310 Dysphagia, unspecified: Secondary | ICD-10-CM

## 2019-03-06 DIAGNOSIS — B955 Unspecified streptococcus as the cause of diseases classified elsewhere: Secondary | ICD-10-CM

## 2019-03-06 DIAGNOSIS — R933 Abnormal findings on diagnostic imaging of other parts of digestive tract: Secondary | ICD-10-CM

## 2019-03-06 LAB — CBC
HCT: 33.7 % — ABNORMAL LOW (ref 39.0–52.0)
Hemoglobin: 11.4 g/dL — ABNORMAL LOW (ref 13.0–17.0)
MCH: 31.3 pg (ref 26.0–34.0)
MCHC: 33.8 g/dL (ref 30.0–36.0)
MCV: 92.6 fL (ref 80.0–100.0)
Platelets: 160 10*3/uL (ref 150–400)
RBC: 3.64 MIL/uL — ABNORMAL LOW (ref 4.22–5.81)
RDW: 13.5 % (ref 11.5–15.5)
WBC: 6.9 10*3/uL (ref 4.0–10.5)
nRBC: 0 % (ref 0.0–0.2)

## 2019-03-06 LAB — BASIC METABOLIC PANEL
Anion gap: 11 (ref 5–15)
BUN: 31 mg/dL — ABNORMAL HIGH (ref 8–23)
CO2: 23 mmol/L (ref 22–32)
Calcium: 8.3 mg/dL — ABNORMAL LOW (ref 8.9–10.3)
Chloride: 104 mmol/L (ref 98–111)
Creatinine, Ser: 1.64 mg/dL — ABNORMAL HIGH (ref 0.61–1.24)
GFR calc Af Amer: 43 mL/min — ABNORMAL LOW (ref >60)
GFR calc non Af Amer: 37 mL/min — ABNORMAL LOW (ref >60)
Glucose, Bld: 118 mg/dL — ABNORMAL HIGH (ref 70–99)
Potassium: 3.8 mmol/L (ref 3.5–5.1)
Sodium: 138 mmol/L (ref 135–145)

## 2019-03-06 LAB — CULTURE, BLOOD (ROUTINE X 2): Culture: NO GROWTH

## 2019-03-06 MED ORDER — AMLODIPINE BESYLATE 5 MG PO TABS
5.0000 mg | ORAL_TABLET | Freq: Every day | ORAL | Status: DC
  Administered 2019-03-06: 11:00:00 5 mg via ORAL
  Filled 2019-03-06: qty 1

## 2019-03-06 MED ORDER — PENICILLIN G 3 MILLION UNITS IVPB - SIMPLE MED: 3.0000 10*6.[IU] | Freq: Four times a day (QID) | INTRAVENOUS | Status: DC

## 2019-03-06 MED ORDER — PENICILLIN G POTASSIUM 20000000 UNITS IJ SOLR
6.0000 10*6.[IU] | Freq: Two times a day (BID) | INTRAVENOUS | Status: DC
  Administered 2019-03-06 – 2019-03-07 (×2): 6 10*6.[IU] via INTRAVENOUS
  Filled 2019-03-06 (×4): qty 6

## 2019-03-06 MED ORDER — LABETALOL HCL 100 MG PO TABS
100.0000 mg | ORAL_TABLET | Freq: Three times a day (TID) | ORAL | Status: DC | PRN
  Administered 2019-03-07: 100 mg via ORAL
  Filled 2019-03-06: qty 1

## 2019-03-06 NOTE — Plan of Care (Signed)

## 2019-03-06 NOTE — Progress Notes (Signed)
   Subjective: Patient seen at the bedside this morning on rounds. Patient request [place] to update his sister. Patient reports episodes of "the shakes" and difficulty controlling his bowels during these episodes. Patient informed that symptoms should improve as his infection is treated.  Objective:  Vital signs in last 24 hours: Vitals:   03/05/19 1500 03/05/19 2206 03/06/19 0418 03/06/19 0500  BP: (!) 197/77 (!) 166/67 (!) 185/77   Pulse: 72 81 73   Resp: 20 14 20    Temp: 97.7 F (36.5 C) 99.5 F (37.5 C) 98.5 F (36.9 C)   TempSrc: Oral Oral Oral   SpO2: 94% 93% 97%   Weight:    97.3 kg  Height:       Physical exam: General: Laying in bed watching TV, NAD HEENT: NCAT CV: RRR, normal S1-S2, no murmurs rubs or gallops appreciated. No lower extremity edema noted PULM: Clear to auscultation bilaterally NEURO: Alert and oriented x4, no focal deficits  Assessment/Plan:  Principal Problem:   Pneumonia Active Problems:   Essential hypertension   Dizziness   AKI (acute kidney injury) ([place])   Streptococcal bacteremia  In summary Zachary [name] is a 85 year old male with a history of HTN, BPH, CKD stage III, CVA in 2019 and chronic pain who presented from an ALF with a 1 to 2-week history of fatigue and dysphasia with solid foods. Pt was treated for aspiration pneumonia and course was complicated by Strep bovis + cultures, raising concerns for endocarditis and malignancy.   #Aspiration pneumonia #Strep bovis bacteremia: Pt has been treated for presumed aspiration pneumonia with Augmentin.  However, his blood cultures grew Group D Strep, raising concerns for potential endocarditis or malignancy. Patient endorses having night time "shakes" which may be Reiger's consistent with a dwelling infection. TTE was obtained and unremarkable. - Needs TEE. Will consult cardiology  - d/c ceftriaxone and switched to Penicillin G per pharmacy recommedations (day 6 of antibiotics) - Will need colonoscopy.  Will reach out to GI today. - DuoNebs every 6 hours PRN  #CKD stage III #AKI: Baseline Cr 1.7-1.9, was 2.13 and appeared hypovolemic on exam when he first presented. Given history of dysphagia he likely had decreased intake overall. Pt on liquid diet now and tolerating well. Cr today was 1.64.   - Daily BMP   #Hx CVA in 2019 #HTN: Pt has had elevated pressures intermittently, 185/77 this AM. - Amlodipine 5 mg - Lisinopril 20 mg daily - Labetalol 100 mg 3 times daily PRN - aspirin 81 mg and Plavix 75 mg daily - atorvastatin 40 mg daily   #BPH - Flomax 0.4 mg daily - Finasteride 5 mg daily   #Chronic pain: On chronic pain medications at baseline.  - Methadone 5 mg TID - Tramadol 50 mg once daily PRN   #FEN/GI - Diet: NPO, ice chips and sips with meds - IVF: None  #VTE ppx: SQ Lovenox    #Code status: Full   #Dispo: Pending medical course.  Zachary Plater, MD Internal Medicine, PGY1 Pager: [phone]  03/06/2019,10:58 AM
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
## 2019-03-06 NOTE — Progress Notes (Signed)
Physical Therapy Treatment Patient Details Name: Zachary [name] MRN: [id number] DOB: November 24, 1929 Today's Date: 03/06/2019    History of Present Illness Pt is an 85 yo male admitted from ALF with PNA. PMH: L hip THA, HTN, BPH, CKD 3, CVA in 2019, chronic pain, GERD, esophageal stricture, anxiety.    PT Comments    Patient received in bed today, very pleasant and willing to work with therapy; BP improved since this morning at 156/70 in bed. Able to complete bed mobility with min guard and extended time, however did require ModA to power up in RW possibly due to foot pain this morning. Tolerated gait training approximately 60f around edge of bed to chair with min guard and RW but limited by pain and fatigue.  He was left up in the chair with all needs met, chair alarm active, and RN present and attending. BP at EOS 170/72, RN aware.    Follow Up Recommendations  Home health PT     Equipment Recommendations  Rolling walker with 5" wheels    Recommendations for Other Services       Precautions / Restrictions Precautions Precautions: Fall Precaution Comments: SOB with minimal exertion Restrictions Weight Bearing Restrictions: No    Mobility  Bed Mobility Overal bed mobility: Needs Assistance Bed Mobility: Supine to Sit     Supine to sit: Min guard     General bed mobility comments: Min guard for safety, extended time  Transfers Overall transfer level: Needs assistance Equipment used: Rolling walker (2 wheeled) Transfers: Sit to/from Stand Sit to Stand: Mod assist         General transfer comment: ModA to power up today possibly due to foot pain, min guard to steady prior to gait  Ambulation/Gait Ambulation/Gait assistance: Min guard Gait Distance (Feet): 15 Feet Assistive device: Rolling walker (2 wheeled) Gait Pattern/deviations: Step-through pattern;Decreased stride length;Antalgic Gait velocity: decreased   General Gait Details: gait distance limited by B foot  pain   Stairs             Wheelchair Mobility    Modified Rankin (Stroke Patients Only)       Balance Overall balance assessment: Needs assistance;History of Falls Sitting-balance support: Bilateral upper extremity supported;Feet supported Sitting balance-Leahy Scale: Fair     Standing balance support: Bilateral upper extremity supported;During functional activity Standing balance-Leahy Scale: Poor Standing balance comment: requires UE assist                            Cognition Arousal/Alertness: Awake/alert Behavior During Therapy: WFL for tasks assessed/performed Overall Cognitive Status: Within Functional Limits for tasks assessed                                 General Comments: cognition appears generally intact for basic tasks today; able to answer questions appropriately and appears accurate       Exercises      General Comments General comments (skin integrity, edema, etc.): VSS during session, BP at start of session 156/70      Pertinent Vitals/Pain Pain Assessment: 0-10 Pain Score: 5  Pain Location: toes and feet Pain Descriptors / Indicators: Aching;Sore Pain Intervention(s): Limited activity within patient's tolerance;Monitored during session;Repositioned    Home Living                      Prior Function  PT Goals (current goals can now be found in the care plan section) Acute Rehab PT Goals Patient Stated Goal: get something to eat PT Goal Formulation: With patient Time For Goal Achievement: 03/16/19 Potential to Achieve Goals: Good Progress towards PT goals: Progressing toward goals    Frequency    Min 3X/week      PT Plan Current plan remains appropriate    Co-evaluation              AM-PAC PT "6 Clicks" Mobility   Outcome Measure  Help needed turning from your back to your side while in a flat bed without using bedrails?: None Help needed moving from lying on your back  to sitting on the side of a flat bed without using bedrails?: None Help needed moving to and from a bed to a chair (including a wheelchair)?: A Little Help needed standing up from a chair using your arms (e.g., wheelchair or bedside chair)?: A Little Help needed to walk in hospital room?: A Little Help needed climbing 3-5 steps with a railing? : A Lot 6 Click Score: 19    End of Session Equipment Utilized During Treatment: Gait belt Activity Tolerance: Patient limited by fatigue;Patient limited by pain Patient left: in chair;with call bell/phone within reach;with chair alarm set;with nursing/sitter in room   PT Visit Diagnosis: Unsteadiness on feet (R26.81);Repeated falls (R29.6);Muscle weakness (generalized) (M62.81)     Time: 1020-1050 PT Time Calculation (min) (ACUTE ONLY): 30 min  Charges:  $Gait Training: 8-22 mins $Therapeutic Activity: 8-22 mins                     Deniece Ree PT, DPT, CBIS  Supplemental Physical Therapist [place]    Pager [phone] Acute Rehab Office [phone]

## 2019-03-07 DIAGNOSIS — L539 Erythematous condition, unspecified: Secondary | ICD-10-CM

## 2019-03-07 LAB — VITAMIN B12: Vitamin B-12: 234 pg/mL (ref 180–914)

## 2019-03-07 LAB — BASIC METABOLIC PANEL
Anion gap: 10 (ref 5–15)
BUN: 35 mg/dL — ABNORMAL HIGH (ref 8–23)
CO2: 25 mmol/L (ref 22–32)
Calcium: 8.3 mg/dL — ABNORMAL LOW (ref 8.9–10.3)
Chloride: 99 mmol/L (ref 98–111)
Creatinine, Ser: 1.66 mg/dL — ABNORMAL HIGH (ref 0.61–1.24)
GFR calc Af Amer: 42 mL/min — ABNORMAL LOW (ref >60)
GFR calc non Af Amer: 37 mL/min — ABNORMAL LOW (ref >60)
Glucose, Bld: 107 mg/dL — ABNORMAL HIGH (ref 70–99)
Potassium: 4.7 mmol/L (ref 3.5–5.1)
Sodium: 134 mmol/L — ABNORMAL LOW (ref 135–145)

## 2019-03-07 LAB — FERRITIN: Ferritin: 50 ng/mL (ref 24–336)

## 2019-03-07 LAB — CBC
HCT: 33.7 % — ABNORMAL LOW (ref 39.0–52.0)
Hemoglobin: 11.5 g/dL — ABNORMAL LOW (ref 13.0–17.0)
MCH: 31.6 pg (ref 26.0–34.0)
MCHC: 34.1 g/dL (ref 30.0–36.0)
MCV: 92.6 fL (ref 80.0–100.0)
Platelets: 174 10*3/uL (ref 150–400)
RBC: 3.64 MIL/uL — ABNORMAL LOW (ref 4.22–5.81)
RDW: 13.7 % (ref 11.5–15.5)
WBC: 8.4 10*3/uL (ref 4.0–10.5)
nRBC: 0 % (ref 0.0–0.2)

## 2019-03-07 LAB — FOLATE: Folate: 7.6 ng/mL (ref >5.9)

## 2019-03-07 LAB — IRON AND TIBC
Iron: 26 ug/dL — ABNORMAL LOW (ref 45–182)
Saturation Ratios: 9 % — ABNORMAL LOW (ref 17.9–39.5)
TIBC: 277 ug/dL (ref 250–450)
UIBC: 251 ug/dL

## 2019-03-07 MED ORDER — SODIUM CHLORIDE 0.9 % IV SOLN
2.0000 g | INTRAVENOUS | Status: DC
  Administered 2019-03-07 – 2019-03-15 (×7): 2 g via INTRAVENOUS
  Filled 2019-03-07: qty 20
  Filled 2019-03-07 (×2): qty 2
  Filled 2019-03-07 (×2): qty 20
  Filled 2019-03-07 (×4): qty 2

## 2019-03-07 MED ORDER — AMLODIPINE BESYLATE 10 MG PO TABS
10.0000 mg | ORAL_TABLET | Freq: Every day | ORAL | Status: DC
  Administered 2019-03-07 – 2019-03-15 (×9): 10 mg via ORAL
  Filled 2019-03-07 (×9): qty 1

## 2019-03-07 MED ORDER — ACETAMINOPHEN 650 MG RE SUPP: 650.0000 mg | Freq: Four times a day (QID) | RECTAL | Status: DC | PRN

## 2019-03-07 MED ORDER — ACETAMINOPHEN 325 MG PO TABS
650.0000 mg | ORAL_TABLET | Freq: Four times a day (QID) | ORAL | Status: DC | PRN
  Administered 2019-03-08 – 2019-03-09 (×2): 650 mg via ORAL
  Filled 2019-03-07 (×2): qty 2

## 2019-03-07 MED ORDER — DICLOFENAC SODIUM 1 % TD GEL
2.0000 g | Freq: Three times a day (TID) | TRANSDERMAL | Status: DC
  Administered 2019-03-07 – 2019-03-15 (×22): 2 g via TOPICAL
  Filled 2019-03-07: qty 100

## 2019-03-07 NOTE — Progress Notes (Addendum)
Daily Rounding Note  03/08/2019, 8:27 AM  LOS: 7 days   SUBJECTIVE:   Chief complaint:  Strep Gallolyticus bacteremia.  dysphagia.  esoph stricture.    BM yestercday.   No nausea.  Abdominal pain that started in his legs this AM.  KUB ordered by attending.  Feels better after pain meds Patient spoke with family members, specifically his sister, and has made decision not to pursue upper endoscopy or colonoscopy.   OBJECTIVE:         Vital signs in last 24 hours:    Temp:  [97.4 F (36.3 C)-97.8 F (36.6 C)] 97.8 F (36.6 C) (09/18 0515) Pulse Rate:  [66-72] 66 (09/18 0515) Resp:  [18] 18 (09/17 2137) BP: (136-153)/(63-67) 136/63 (09/18 0515) SpO2:  [94 %-95 %] 95 % (09/18 0515) Last BM Date: 03/07/19 Filed Weights   03/04/19 1955 03/06/19 0500 03/07/19 0500  Weight: 99.7 kg 97.3 kg 97.5 kg   General: obese, alert, comfortable, frail.       Heart: RRR Chest: clear bil.  No dyspnea or cough.  Slight snoring sounds as he breaths Abdomen: obese, soft,  Tender on right without guard or rebound.  Hypoactive BS    Extremities: LE edema, improved LE erythema.    Neuro/Psych:  Oriented to year, place, self.  Moves all limbs.      Intake/Output from previous day: 09/17 0701 - 09/18 0700 In: 1700 [P.O.:1700] Out: 2775 [Urine:2775]  Intake/Output this shift: No intake/output data recorded.  Lab Results: Recent Labs    03/06/19 0204 03/07/19 0221 03/08/19 0227  WBC 6.9 8.4 8.5  HGB 11.4* 11.5* 11.3*  HCT 33.7* 33.7* 35.2*  PLT 160 174 198   BMET Recent Labs    03/06/19 0204 03/07/19 0221 03/08/19 0227  NA 138 134* 137  K 3.8 4.7 4.0  CL 104 99 101  CO2 23 25 25   GLUCOSE 118* 107* 113*  BUN 31* 35* 38*  CREATININE 1.64* 1.66* 1.83*  CALCIUM 8.3* 8.3* 8.3*   LFT No results for input(s): PROT, ALBUMIN, AST, ALT, ALKPHOS, BILITOT, BILIDIR, IBILI in the last 72 hours. PT/INR No results for input(s):  LABPROT, INR in the last 72 hours. Hepatitis Panel No results for input(s): HEPBSAG, HCVAB, HEPAIGM, HEPBIGM in the last 72 hours.  Studies/Results: No results found.   Scheduled Meds: . amLODipine  10 mg Oral Daily  . aspirin EC  81 mg Oral Daily  . atorvastatin  40 mg Oral q1800  . diclofenac sodium  2 g Topical TID  . enoxaparin (LOVENOX) injection  40 mg Subcutaneous Q24H  . feeding supplement (ENSURE ENLIVE)  237 mL Oral TID BM  . finasteride  5 mg Oral Daily  . fluticasone  2 spray Each Nare Daily  . furosemide  40 mg Oral Daily  . lisinopril  20 mg Oral Daily  . methadone  5 mg Oral Q8H  . mirtazapine  30 mg Oral QHS  . pantoprazole  40 mg Oral Daily  . polyvinyl alcohol  2 drop Both Eyes TID  . risperiDONE  0.25 mg Oral BID  . senna-docusate  2 tablet Oral BID  . sertraline  150 mg Oral Daily  . sodium chloride flush  3 mL Intravenous Q12H  . tamsulosin  0.4 mg Oral Daily   Continuous Infusions: . cefTRIAXone (ROCEPHIN)  IV 2 g (03/07/19 1055)   PRN Meds:.acetaminophen **OR** acetaminophen, albuterol, labetalol, LORazepam, traMADol   ASSESMENT:   *  Dysphagia, esoph stricture.  Previous dilations esoph strictures.  Tolerating D3/thin liquids.    *    Strep Gallolyticus bacteremia.  R/o colonic neoplasm as source.  Treatment with pcn G   *   Chronic Plavix, now on hold to allow for endoscopic procedures.  Last dose was 9/16 a.m.  *     Pneumonia, presumed due to aspiration.  Rocephin in place.     *    AKI.     PLAN   *   Wait on KUB reading.   At present, unless pt changes his mind, no plans for Colonoscopy or EGD. Dr Jerilynn Mages will try to reach sister to confirm decision re endoscopy/colonoscopy.        Addendum at 11 AM: KUB: Shows no acute abdominal process, free air or obstruction.  Azucena Freed  03/08/2019, 8:27 AM Phone [phone]        Attending Physician's Attestation   I have taken an interval history, reviewed the chart and examined the  patient.   Long conversation with the patient this morning.  He states he spoke with his sister and family and at this point does not want to pursue further invasive procedures such as an endoscopy and colonoscopy.  I described to him the rationale once again why we had offered the patient these procedures but he feels that the risks are too high and the way that he communicated them to his family they all felt that it was not worth pursuing at this time.  He states "if my time is now or in 3 years it is in God's hands."  I offered him an opportunity for me to try and talk with his sister and ensure that he does not want to move forward with the procedures and that they understand all of the implications.  He has the capacity to make his own decisions and if this is what he wants then we will respect his wishes.  The earliest I would consider a EGD and colonoscopy would be Sunday (4 days off Plavix) but this may be a moot point.  I have tried to call the patient's sister at the only number that is present on 3 occasions and have left a voicemail on 2 occasions.  I will try and call her again this afternoon.  If the decision remains that no endoscopic evaluation will be performed we will sign off. I agree with the Advanced Practitioner's note, impression, and recommendations with updates and my documentation above.   Justice Britain, MD [facility] Advanced Endoscopy Office # [phone]    PM ADDENDUM I have heard from the medical service as well as PA Gribbin that the patient has now decided to move forward with the procedures.  I had an opportunity to speak with the patient's sister this afternoon and talk with her and she also states that after further consideration they have all agreed to move forward with the procedures.  To give them the best opportunity and the patient to have the least amount of bleeding risk we have decided to go ahead and do this in 5 days from the last Plavix  dose which was on Wednesday morning.  This means that the patient's EGD/colonoscopy will be scheduled for Monday.  On Monday at my colleague Dr. Wilfrid Lund takes over for the service and I will give him the background for this patient.  The plan would be an endoscopy with possible biopsies and dilation and colonoscopy for evaluation  of potential colon cancer.  We will passed by over the weekend to ensure his bowel preparation is going well for Monday's planned procedures.   Justice Britain, MD [facility] Advanced Endoscopy Office # [phone]

## 2019-03-07 NOTE — Plan of Care (Signed)

## 2019-03-07 NOTE — Progress Notes (Addendum)
Subjective: Patient seen at the bedside this morning on rounds today. Patient reports pressure-like pain in the back of his leg that started 5:30 this morning. No pain on left side. Patient states leg are more red than before.   Objective:  Vital signs in last 24 hours: Vitals:   03/06/19 1631 03/06/19 2047 03/07/19 0500 03/07/19 0549  BP: (!) 135/59 (!) 144/66  (!) 189/76  Pulse: 81 72  71  Resp: 20 20  20   Temp: 98.3 F (36.8 C) 97.6 F (36.4 C)  98.4 F (36.9 C)  TempSrc: Oral Oral  Oral  SpO2: 93% 93%  92%  Weight:   97.5 kg   Height:       Physical exam: General: Laying in bed, NAD HEENT: NCAT CV: RRR, normal S1-S2, no murmurs rubs or gallops appreciated. No lower extremity edema noted PULM: Clear to auscultation bilaterally NEURO: Alert and oriented x4, no focal deficits DERM: Mild bilateral lower extremity erythema.  Right lower extremity tender to touch.  Assessment/Plan:  Principal Problem:   Pneumonia Active Problems:   Essential hypertension   Dizziness   AKI (acute kidney injury) ([place])   Streptococcal bacteremia  In summary Mr. Amos is a 85 year old male with a history of HTN, BPH, CKD stage III, CVA in 2019 and chronic pain who presented from an ALF with a 1 to 2-week history of fatigue and dysphasia with solid foods. Pt was treated for aspiration pneumonia and course was complicated by Strep bovis + cultures, raising concerns for endocarditis and malignancy.   #Aspiration pneumonia #Strep bovis bacteremia: Pt has been treated for presumed aspiration pneumonia with Augmentin.  However, his blood cultures grew Group D Strep, raising concerns for potential endocarditis or malignancy. Patient endorses having night time "shakes" which may be Reiger's consistent with a dwelling infection. TTE was obtained and unremarkable. - Needs TEE.  Talk to cardiology yesterday about obtaining TEE. Still awaiting their recommendations. - d/c  Penicillin G, restarted  ceftriaxone.  Patient reported feeling lightheaded and little nauseous since starting penicillin G.   - Will need colonoscopy. GI consulted and wants patient to be off of Plavix for 5 days prior to esophageal dilation followed by colon prep. - DuoNebs every 6 hours PRN  #BLLE erythema: Patient had acute onset right lower extremity pain early this morning.  He subjectively thinks his right leg is more red than the other.  On exam the bilateral erythema appears improved from previous days and does not appear like a cellulitis is developing.  We will continue to monitor - Ordered Voltaren gel PRN  - Ordered heating pad PRN  #CKD stage III #AKI: Baseline Cr 1.7-1.9, was 2.13 and appeared hypovolemic on exam when he first presented. Given history of dysphagia he likely had decreased intake overall. Pt on liquid diet now and tolerating well. Cr today was 1.64.   - Daily BMP   #Hx CVA in 2019 #HTN: Pt has had elevated pressures intermittently, 189/76 this AM. - Amlodipine 10 mg daily - Lisinopril 20 mg daily - Labetalol 100 mg 3 times daily PRN - aspirin 81 mg and Plavix 75 mg daily - atorvastatin 40 mg daily   #BPH - Flomax 0.4 mg daily - Finasteride 5 mg daily   #Chronic pain: On chronic pain medications at baseline.  - Methadone 5 mg TID - Tramadol 50 mg once daily PRN   #FEN/GI - Diet: NPO, ice chips and sips with meds - IVF: None  #VTE ppx: SQ  Lovenox    #Code status: Full   #Dispo: Pending medical course.  [name], MD Internal Medicine, PGY1 Pager: [phone]  03/07/2019,2:14 PM

## 2019-03-07 NOTE — TOC Progression Note (Signed)
Transition of Care [facility]) - Progression Note    Patient Details  Name: Zachary [name] MRN: [id number] Date of Birth: [birth year]  Transition of Care [facility]) CM/SW [place], [place] Phone Number: 03/07/2019, 1:53 PM  Clinical Narrative:    Continue to follow for disposition; if pt requires LTAbx will need to dc to SNF prior to return to ALF. Follow for further work up and clarification of pt needs.   Expected Discharge Plan: Assisted Living(w home health) Barriers to Discharge: Continued Medical Work up  Expected Discharge Plan and Services Expected Discharge Plan: Assisted Living(w home health) In-house Referral: Clinical Social Work Discharge Planning Services: CM Consult Post Acute Care Choice: Home Health, Resumption of Svcs/PTA Provider Living arrangements for the past 2 months: [place] Expected Discharge Date: 03/04/19               HH Arranged: PT, OT HH Agency: [facility]   Social Determinants of Health (SDOH) Interventions    Readmission Risk Interventions No flowsheet data found.

## 2019-03-07 NOTE — Progress Notes (Signed)
  Speech Language Pathology Treatment: Dysphagia  Patient Details Name: Zachary [name] MRN: [id number] DOB: 1930-05-09 Today's Date: 03/07/2019 Time: 7943-2761 SLP Time Calculation (min) (ACUTE ONLY): 27 min  Assessment / Plan / Recommendation Clinical Impression  Patient eating lunch when entering room. He shared he has been ordering "lighter" foods and reducing amount of intake slightly. Patient was reminded to follow solids with liquids to assist  with esophageal clearance between bites of solid. Education provided about fibrous meats and breads that tend to cause more problems with esophageal dysphagia. Patient was very receptive to recommendation. ST to sign off at this time. All swallowing goals have been met.     HPI HPI: Pt is an 85 yo male admitted from ALF with PNA. PMH: HTN, BPH, CKD 3, CVA in 2019, chronic pain, GERD, esophageal stricture, anxiety. Per chart, pt has had symptoms of dysphagia at least as far back as 2013, when an esophagram showed borderline esophageal dysmotility, small HH, intermittent tertiary contractions, and laryngeal penetration.       SLP Plan  Continue with current plan of care       Recommendations  Diet recommendations: Dysphagia 3 (mechanical soft);Thin liquid Liquids provided via: Cup;Straw Medication Administration: Crushed with puree Supervision: Patient able to self feed;Intermittent supervision to cue for compensatory strategies Compensations: Slow rate;Small sips/bites;Multiple dry swallows after each bite/sip;Other (Comment) Postural Changes and/or Swallow Maneuvers: Seated upright 90 degrees;Upright 30-60 min after meal                Oral Care Recommendations: Oral care BID SLP Visit Diagnosis: Dysphagia, unspecified (R13.10) Plan: Continue with current plan of care       [place], MA, CCC-SLP 03/07/2019 3:01 PM

## 2019-03-07 NOTE — Progress Notes (Signed)
Pt complaining of pain to RLE stating "it feel like a blood pressure cuff is tight around my leg". Pt reports it is more reddened than usual. Tender and warm to touch. Dr. Sheppard Coil paged and stated he would come to bedside. Will continue to monitor.

## 2019-03-07 NOTE — Plan of Care (Signed)

## 2019-03-07 NOTE — Progress Notes (Signed)
Occupational Therapy Treatment Patient Details Name: Zachary [name] MRN: [id number] DOB: 01-Feb-1930 Today's Date: 03/07/2019    History of present illness Pt is an 85 yo male admitted from ALF with PNA. PMH: L hip THA, HTN, BPH, CKD 3, CVA in 2019, chronic pain, GERD, esophageal stricture, anxiety.   OT comments  Pt seen for functional mobility and ADL progression this date. Upon OT arrival pt reports feeling dizzy and "pressure" in RLE. RN notified and entered to observe. BP taken in supine 138/66 HR- 69. Deferred OOB ADLS/ transfers d/t pain in RLE and dizziness. Session focus on bed mobility as precursor to higher level ADLs. Pt min A for bed mobility; pt able to pull self up in bed after visual/ tactile cue to orient pt to overhead bed rail. Pt left in chair position with MD entering to assess RLE. DC plan remains appropriate. Will continue to follow for acute OT needs.   Follow Up Recommendations  Home health OT;Supervision/Assistance - 24 hour;Other (comment)(at ALF)    Equipment Recommendations  None recommended by OT    Recommendations for Other Services      Precautions / Restrictions Precautions Precautions: Fall Precaution Comments: SOB with minimal exertion Restrictions Weight Bearing Restrictions: No       Mobility Bed Mobility Overal bed mobility: Needs Assistance Bed Mobility: Rolling Rolling: Min assist         General bed mobility comments: MIN A to get repostioned in bed; able to attempt to pull up in bed after visual/ tactile cues; pt left in chair position in bed  Transfers                 General transfer comment: declined OOB transfer this session    Balance                                           ADL either performed or assessed with clinical judgement   ADL                       Lower Body Dressing: Total assistance;Bed level Lower Body Dressing Details (indicate cue type and reason): total a to reach down  and doff socks in bed   Toilet Transfer Details (indicate cue type and reason): declined OOB transfer d/t head feeling dizzy and swelling/ redness in feet ( RN aware- 138/66 in supine)         Functional mobility during ADLs: Minimal assistance(functional mobility limited to bed mobility d/t pain in BLEs and feeling dizzy; MIN A to get repositioned in bed) General ADL Comments: pt decline ADL participation; session focus on bed mobility as precursor to higher level ADLs     Vision       Perception     Praxis      Cognition Arousal/Alertness: Awake/alert Behavior During Therapy: WFL for tasks assessed/performed Overall Cognitive Status: Within Functional Limits for tasks assessed                                 General Comments: cog[place]on appears intact for session today; reporting pain in BLE and answers questions appropriately        Exercises     Shoulder Instructions       General Comments      Pertinent Vitals/ Pain  Pain Assessment: Faces Faces Pain Scale: Hurts even more Pain Location: BLEs increased pain on RLE Pain Descriptors / Indicators: Aching;Sore;Heaviness;Pressure;Radiating;Shooting;Moaning Pain Intervention(s): Limited activity within patient's tolerance;Monitored during session;Repositioned  Home Living                                          Prior Functioning/Environment              Frequency  Min 2X/week        Progress Toward Goals  OT Goals(current goals can now be found in the care plan section)  Progress towards OT goals: Progressing toward goals  Acute Rehab OT Goals Time For Goal Achievement: 03/16/19 Potential to Achieve Goals: Good  Plan Discharge plan remains appropriate    Co-evaluation                 AM-PAC OT "6 Clicks" Daily Activity     Outcome Measure   Help from another person eating meals?: None Help from another person taking care of personal grooming?: A  Little Help from another person toileting, which includes using toliet, bedpan, or urinal?: A Little Help from another person bathing (including washing, rinsing, drying)?: A Lot Help from another person to put on and taking off regular upper body clothing?: A Little Help from another person to put on and taking off regular lower body clothing?: Total 6 Click Score: 16    End of Session    OT Visit Diagnosis: Muscle weakness (generalized) (M62.81);Unsteadiness on feet (R26.81)   Activity Tolerance Patient tolerated treatment well   Patient Left in bed;with call bell/phone within reach;Other (comment)(residents in room checking on RLE)   Nurse Communication          Time: [phone] OT Time Calculation (min): 20 min  Charges: OT Treatments $Self Care/Home Management : 8-22 mins  [place], [place] 03/07/2019, 9:54 AM

## 2019-03-07 NOTE — Care Management Important Message (Signed)
Important Message  Patient Details  Name: Zachary [name] MRN: [id number] Date of Birth: October 18, 1929   Medicare Important Message Given:  Yes     [name] 03/07/2019, 4:03 PM

## 2019-03-08 ENCOUNTER — Inpatient Hospital Stay (HOSPITAL_COMMUNITY): Payer: Medicare Other

## 2019-03-08 LAB — CBC
HCT: 35.2 % — ABNORMAL LOW (ref 39.0–52.0)
Hemoglobin: 11.3 g/dL — ABNORMAL LOW (ref 13.0–17.0)
MCH: 30.3 pg (ref 26.0–34.0)
MCHC: 32.1 g/dL (ref 30.0–36.0)
MCV: 94.4 fL (ref 80.0–100.0)
Platelets: 198 10*3/uL (ref 150–400)
RBC: 3.73 MIL/uL — ABNORMAL LOW (ref 4.22–5.81)
RDW: 14 % (ref 11.5–15.5)
WBC: 8.5 10*3/uL (ref 4.0–10.5)
nRBC: 0 % (ref 0.0–0.2)

## 2019-03-08 LAB — BASIC METABOLIC PANEL
Anion gap: 11 (ref 5–15)
BUN: 38 mg/dL — ABNORMAL HIGH (ref 8–23)
CO2: 25 mmol/L (ref 22–32)
Calcium: 8.3 mg/dL — ABNORMAL LOW (ref 8.9–10.3)
Chloride: 101 mmol/L (ref 98–111)
Creatinine, Ser: 1.83 mg/dL — ABNORMAL HIGH (ref 0.61–1.24)
GFR calc Af Amer: 38 mL/min — ABNORMAL LOW (ref >60)
GFR calc non Af Amer: 32 mL/min — ABNORMAL LOW (ref >60)
Glucose, Bld: 113 mg/dL — ABNORMAL HIGH (ref 70–99)
Potassium: 4 mmol/L (ref 3.5–5.1)
Sodium: 137 mmol/L (ref 135–145)

## 2019-03-08 SURGERY — ESOPHAGOGASTRODUODENOSCOPY (EGD) WITH PROPOFOL
Anesthesia: Monitor Anesthesia Care

## 2019-03-08 MED ORDER — PEG-KCL-NACL-NASULF-NA ASC-C 100 G PO SOLR
0.5000 | Freq: Once | ORAL | Status: AC
  Administered 2019-03-10: 100 g via ORAL
  Filled 2019-03-08 (×2): qty 1

## 2019-03-08 MED ORDER — PEG-KCL-NACL-NASULF-NA ASC-C 100 G PO SOLR
0.5000 | Freq: Once | ORAL | Status: DC
  Filled 2019-03-08: qty 1

## 2019-03-08 MED ORDER — BISACODYL 5 MG PO TBEC
5.0000 mg | DELAYED_RELEASE_TABLET | Freq: Every day | ORAL | Status: AC
  Administered 2019-03-08 – 2019-03-09 (×2): 5 mg via ORAL
  Filled 2019-03-08 (×2): qty 1

## 2019-03-08 MED ORDER — ENOXAPARIN SODIUM 40 MG/0.4ML ~~LOC~~ SOLN
40.0000 mg | SUBCUTANEOUS | Status: DC
  Administered 2019-03-08 – 2019-03-09 (×2): 40 mg via SUBCUTANEOUS
  Filled 2019-03-08 (×2): qty 0.4

## 2019-03-08 MED ORDER — PEG-KCL-NACL-NASULF-NA ASC-C 100 G PO SOLR: 1.0000 | Freq: Once | ORAL | Status: DC

## 2019-03-08 MED ORDER — BISACODYL 5 MG PO TBEC: 20.0000 mg | DELAYED_RELEASE_TABLET | Freq: Once | ORAL | Status: DC

## 2019-03-08 MED ORDER — ENOXAPARIN SODIUM 40 MG/0.4ML ~~LOC~~ SOLN: 40.0000 mg | SUBCUTANEOUS | Status: DC

## 2019-03-08 MED ORDER — BISACODYL 5 MG PO TBEC
20.0000 mg | DELAYED_RELEASE_TABLET | Freq: Once | ORAL | Status: AC
  Administered 2019-03-10: 20 mg via ORAL
  Filled 2019-03-08: qty 4

## 2019-03-08 NOTE — Progress Notes (Signed)
Physical Therapy Treatment Patient Details Name: Zachary [name] MRN: [id number] DOB: 1929/12/23 Today's Date: 03/08/2019    History of Present Illness Pt is an 85 yo male admitted from ALF with PNA. PMH: L hip THA, HTN, BPH, CKD 3, CVA in 2019, chronic pain, GERD, esophageal stricture, anxiety.    PT Comments    Pt able to ambulate ~ 36' with RW and MIN/guard today.  He has to walk a longer distance at his ALF and PT will continue to work towards increasing gait tolerance and distance.   Follow Up Recommendations  Home health PT     Equipment Recommendations  Rolling walker with 5" wheels    Recommendations for Other Services       Precautions / Restrictions Precautions Precautions: Fall Restrictions Weight Bearing Restrictions: No    Mobility  Bed Mobility Overal bed mobility: Needs Assistance Bed Mobility: Supine to Sit     Supine to sit: Min assist;HOB elevated     General bed mobility comments: MIN A to get trunk upright  Transfers Overall transfer level: Needs assistance Equipment used: Rolling walker (2 wheeled) Transfers: Sit to/from Stand Sit to Stand: Min assist;Mod assist         General transfer comment: cues for hand placement.  MIN/MOD A to power up and slow transition to standing.  Once standing, pt had to urinate which he did, but condom cath although still attached did not catch most of his urine and he went on floor  Ambulation/Gait Ambulation/Gait assistance: Min guard Gait Distance (Feet): 40 Feet Assistive device: Rolling walker (2 wheeled) Gait Pattern/deviations: Step-through pattern;Decreased step length - right;Decreased step length - left;Trunk flexed Gait velocity: decreased   General Gait Details: cues for upright trunk posture. Pt with no c/o foot pain today.   Stairs             Wheelchair Mobility    Modified Rankin (Stroke Patients Only)       Balance   Sitting-balance support: Bilateral upper extremity  supported;Feet supported Sitting balance-Leahy Scale: Fair Sitting balance - Comments: He was unable to don socks in sitting. States he just wears slip on shoes.   Standing balance support: Bilateral upper extremity supported;During functional activity Standing balance-Leahy Scale: Poor Standing balance comment: requires UE support                            Cognition Arousal/Alertness: Awake/alert Behavior During Therapy: WFL for tasks assessed/performed Overall Cognitive Status: Within Functional Limits for tasks assessed                                        Exercises      General Comments        Pertinent Vitals/Pain Pain Assessment: No/denies pain    Home Living                      Prior Function            PT Goals (current goals can now be found in the care plan section) Progress towards PT goals: Progressing toward goals    Frequency    Min 3X/week      PT Plan Current plan remains appropriate    Co-evaluation              AM-PAC PT "6 Clicks" Mobility  Outcome Measure  Help needed turning from your back to your side while in a flat bed without using bedrails?: None Help needed moving from lying on your back to sitting on the side of a flat bed without using bedrails?: A Little Help needed moving to and from a bed to a chair (including a wheelchair)?: A Little Help needed standing up from a chair using your arms (e.g., wheelchair or bedside chair)?: A Little Help needed to walk in hospital room?: A Little Help needed climbing 3-5 steps with a railing? : A Lot 6 Click Score: 18    End of Session Equipment Utilized During Treatment: Gait belt Activity Tolerance: Patient tolerated treatment well Patient left: in chair;with call bell/phone within reach;with chair alarm set Nurse Communication: Mobility status;Other (comment)(catheter issue) PT Visit Diagnosis: Unsteadiness on feet (R26.81);Repeated falls  (R29.6);Muscle weakness (generalized) (M62.81)     Time: 1308-6578 PT Time Calculation (min) (ACUTE ONLY): 27 min  Charges:  $Gait Training: 8-22 mins $Therapeutic Activity: 8-22 mins                     Zachary [name], Zachary [name] Pager [phone] 03/08/2019    Zachary [name] 03/08/2019, 12:59 PM

## 2019-03-08 NOTE — Progress Notes (Signed)
    Subjective: Patient seen at the bedside this afternoon. The patient says his legs feel better than they did yesterday but the pain seems to switch. We told the patient he may apply the Voltaren gel on either shin as needed. The patient also says he had a chance to discuss EGD and colonoscopy with his family, and ultimately decided that he would like to have it done. No other complaints at this time.   Objective:  Vital signs in last 24 hours: Vitals:   03/07/19 0932 03/07/19 0946 03/07/19 2137 03/08/19 0515  BP: 138/66 138/66 (!) 153/67 136/63  Pulse: 69 69 72 66  Resp:   18   Temp:   (!) 97.4 F (36.3 C) 97.8 F (36.6 C)  TempSrc:   Oral Oral  SpO2:   94% 95%  Weight:      Height:       Physical exam: General: Laying in bed, NAD HEENT: NCAT CV: RRR, normal S1-S2, no murmurs rubs or gallops appreciated. No lower extremity edema noted PULM: Clear to auscultation bilaterally, coughs during exam NEURO: Alert and oriented x4, no focal deficits DERM: Mild bilateral lower extremity erythema.    Assessment/Plan:  Principal Problem:   Streptococcal bacteremia Active Problems:   Essential hypertension   Dizziness   Pneumonia   AKI (acute kidney injury) ([place])  In summary Zachary [name] is a 85 year old male with a history of HTN, BPH, CKD stage III, CVA in 2019 and chronic pain who presented from an ALF with a 1 to 2-week history of fatigue and dysphasia with solid foods. Pt was treated for aspiration pneumonia and course was complicated by Strep bovis + cultures, raising concerns for endocarditis and malignancy.   #Aspiration pneumonia #Strep bovis bacteremia: Pt has been treated for presumed aspiration pneumonia with Augmentin.  However, his blood cultures grew Group D Strep, raising concerns for potential endocarditis or malignancy. Patient endorses having night time "shakes" which may be Reiger's consistent with a dwelling infection. TTE was obtained and unremarkable. - TEE  scheduled for Monday, per cardiology - Will need colonoscopy. GI consulted and wants patient to be off of Plavix for 5 days prior to esophageal dilation followed by colon prep. Scheduled for this Sunday. - Continue ceftriaxone - DuoNebs every 6 hours PRN  #BLLE erythema: Patient had acute onset right lower extremity pain early this morning.  He subjectively thinks his right leg is more red than the other.  On exam the bilateral erythema appears improved from previous days and does not appear like a cellulitis is developing.  We will continue to monitor - Voltaren gel PRN - Heating pad PRN  #Hx CVA in 2019 #HTN: Pt has had elevated pressures intermittently, 189/76 this AM. - Amlodipine 10 mg daily - Lisinopril 20 mg daily - Labetalol 100 mg 3 times daily PRN - aspirin 81 mg and Plavix 75 mg daily - atorvastatin 40 mg daily   #BPH - Flomax 0.4 mg daily - Finasteride 5 mg daily   #Chronic pain: On chronic pain medications at baseline - Methadone 5 mg TID - Tramadol 50 mg once daily PRN   #FEN/GI - Diet: NPO, ice chips and sips with meds - IVF: None  #VTE ppx: SQ Lovenox    #Code status: Full   #Dispo: Pending medical course.  Zachary Plater, MD Internal Medicine, PGY1 Pager: [phone]  03/08/2019,1:59 PM

## 2019-03-09 LAB — CBC
HCT: 34.2 % — ABNORMAL LOW (ref 39.0–52.0)
Hemoglobin: 10.9 g/dL — ABNORMAL LOW (ref 13.0–17.0)
MCH: 30.4 pg (ref 26.0–34.0)
MCHC: 31.9 g/dL (ref 30.0–36.0)
MCV: 95.3 fL (ref 80.0–100.0)
Platelets: 212 10*3/uL (ref 150–400)
RBC: 3.59 MIL/uL — ABNORMAL LOW (ref 4.22–5.81)
RDW: 14 % (ref 11.5–15.5)
WBC: 9.1 10*3/uL (ref 4.0–10.5)
nRBC: 0 % (ref 0.0–0.2)

## 2019-03-09 LAB — BASIC METABOLIC PANEL
Anion gap: 11 (ref 5–15)
BUN: 39 mg/dL — ABNORMAL HIGH (ref 8–23)
CO2: 26 mmol/L (ref 22–32)
Calcium: 8.1 mg/dL — ABNORMAL LOW (ref 8.9–10.3)
Chloride: 98 mmol/L (ref 98–111)
Creatinine, Ser: 1.65 mg/dL — ABNORMAL HIGH (ref 0.61–1.24)
GFR calc Af Amer: 43 mL/min — ABNORMAL LOW (ref >60)
GFR calc non Af Amer: 37 mL/min — ABNORMAL LOW (ref >60)
Glucose, Bld: 103 mg/dL — ABNORMAL HIGH (ref 70–99)
Potassium: 4.2 mmol/L (ref 3.5–5.1)
Sodium: 135 mmol/L (ref 135–145)

## 2019-03-09 MED ORDER — POLYETHYLENE GLYCOL 3350 17 G PO PACK
68.0000 g | PACK | Freq: Once | ORAL | Status: DC
  Filled 2019-03-09: qty 4

## 2019-03-09 MED ORDER — BISACODYL 5 MG PO TBEC
10.0000 mg | DELAYED_RELEASE_TABLET | Freq: Two times a day (BID) | ORAL | Status: AC
  Administered 2019-03-09 (×2): 10 mg via ORAL
  Filled 2019-03-09 (×2): qty 2

## 2019-03-09 NOTE — Progress Notes (Signed)
Gastroenterology Inpatient Follow-up Note   PATIENT IDENTIFICATION  Zachary [name] is a 85 y.o. male with a pmh significant for CAD, arthritis, cataracts, chronic renal insufficiency, diverticulosis, GERD, obesity, CHF.  GI asked to evaluate patient in setting of strep bovis infection and with esophageal dysphagia. Hospital Day: 9  SUBJECTIVE  Patient is doing well this morning. Tolerating his meals. He had an opportunity to talk with his family once again as noted in my addendum in the evening yesterday.  He will to move forward with endoscopy and colonoscopy. No new worsened abdominal pain. Still has lower extremity edema. Feels his breathing is doing okay today. He thinks he is ready to start taking the preparation today.   OBJECTIVE  Scheduled Inpatient Medications:   amLODipine  10 mg Oral Daily   aspirin EC  81 mg Oral Daily   atorvastatin  40 mg Oral q1800   [START ON 03/10/2019] bisacodyl  20 mg Oral Once   bisacodyl  5 mg Oral Daily   diclofenac sodium  2 g Topical TID   enoxaparin (LOVENOX) injection  40 mg Subcutaneous Q24H   feeding supplement (ENSURE ENLIVE)  237 mL Oral TID BM   finasteride  5 mg Oral Daily   fluticasone  2 spray Each Nare Daily   furosemide  40 mg Oral Daily   lisinopril  20 mg Oral Daily   methadone  5 mg Oral Q8H   mirtazapine  30 mg Oral QHS   pantoprazole  40 mg Oral Daily   [START ON 03/10/2019] peg 3350 powder  0.5 kit Oral Once   And   [START ON 03/10/2019] peg 3350 powder  0.5 kit Oral Once   polyvinyl alcohol  2 drop Both Eyes TID   risperiDONE  0.25 mg Oral BID   senna-docusate  2 tablet Oral BID   sertraline  150 mg Oral Daily   sodium chloride flush  3 mL Intravenous Q12H   tamsulosin  0.4 mg Oral Daily   Continuous Inpatient Infusions:   cefTRIAXone (ROCEPHIN)  IV Stopped (03/08/19 1845)   PRN Inpatient Medications: acetaminophen **OR** acetaminophen, albuterol, labetalol, LORazepam,  traMADol   Physical Examination  Temp:  [97.3 F (36.3 C)-98.4 F (36.9 C)] 97.3 F (36.3 C) (09/19 0557) Pulse Rate:  [66-77] 66 (09/19 0557) Resp:  [19] 19 (09/19 0557) BP: (155-171)/(67) 171/67 (09/19 0557) SpO2:  [96 %-99 %] 99 % (09/19 0557) Temp (24hrs), Avg:97.9 F (36.6 C), Min:97.3 F (36.3 C), Max:98.4 F (36.9 C)  Weight: 97.5 kg GEN: Chronically ill-appearing but nontoxic, no acute distress eating breakfast this morning PSYCH: Cooperative, without pressured speech EYE: Conjunctivae pink, sclerae anicteric  ENT: MMM CV: Non-tachycardic RESP: No wheezing GI: Obese, rounded, normoactive bowel sounds, soft, mild tenderness to palpation deeply in the midepigastrium, no volitional guarding or rebound present  MSK/EXT: Bilateral lower extremity edema present SKIN: No jaundice NEURO:  Alert & Oriented x 3, no focal deficits   Review of Data   Laboratory Studies   Recent Labs  Lab 03/09/19 0243  NA 135  K 4.2  CL 98  CO2 26  BUN 39*  CREATININE 1.65*  GLUCOSE 103*  CALCIUM 8.1*   No results for input(s): AST, ALT, GGT, ALKPHOS in the last 168 hours.  Invalid input(s): TBILI, CONJBILI, ALB  Recent Labs  Lab 03/07/19 0221 03/08/19 0227 03/09/19 0243  WBC 8.4 8.5 9.1  HGB 11.5* 11.3* 10.9*  HCT 33.7* 35.2* 34.2*  PLT 174 198 212  No results for input(s): APTT, INR in the last 168 hours. Computed MELD-Na score unavailable. Necessary lab results were not found in the last year. Computed MELD score unavailable. Necessary lab results were not found in the last year.  Imaging Studies  Dg Abd 2 Views  Result Date: 03/08/2019 CLINICAL DATA:  Patient with abdominal pain for 3 weeks and nausea EXAM: ABDOMEN - 2 VIEW COMPARISON:  Abdominal plain film of January 16, 2013 and CT of the chest, abdomen and pelvis of December 16, 2017 FINDINGS: Study is limited by patient body habitus. No signs of free air beneath either right or left hemidiaphragm with basilar atelectasis  bilaterally. Bowel gas pattern is unremarkable. No acute bone finding with left hip arthroplasty. IMPRESSION: No signs of acute abdominal process, no free air or obstruction Electronically Signed   By: Zetta Bills M.D.   On: 03/08/2019 10:46    ASSESSMENT  Mr. Spickler is a 85 y.o. male with a pmh significant for CAD, arthritis, cataracts, chronic renal insufficiency, diverticulosis, GERD, obesity, CHF.  GI asked to evaluate patient in setting of strep bovis infection and with esophageal dysphagia.  The patient remains hemodynamically stable.  His bacteremia is being treated appropriately.  We will plan to proceed as patient and family have now decided to move forward with procedures on Monday which will be a full 5 days off of Plavix therapy.  We will plan for esophageal evaluation and possible biopsies and possible dilation as well as gastric biopsies.  Colonoscopy to be pursued to evaluate for colon cancer and decision as to polyp resection if found will be made at time of procedure.  Full colonoscopy prep to begin on Sunday and has been ordered.  I will start him on some gentle laxatives today.  The risks and benefits of endoscopic evaluation were discussed with the patient; these include but are not limited to the risk of perforation, infection, bleeding, missed lesions, lack of diagnosis, severe illness requiring hospitalization, as well as anesthesia and sedation related illnesses.  The patient is agreeable to proceed.    PLAN/RECOMMENDATIONS  MoviPrep scheduled for Sunday as well as Dulcolax (already ordered) Dulcolax 10 mg x 2 doses on Saturday as well as MiraLAX 68 g to be given today to help start bowel movements.  Continue to hold Plavix EGD/colonoscopy on Monday with Dr. Loletha Carrow   Please page/call with questions or concerns.   Justice Britain, MD [facility] Office # [phone]    LOS: 8 days  Irving Copas  03/09/2019, 8:23 AM

## 2019-03-09 NOTE — Progress Notes (Signed)
   Subjective: Patient sitting up in the chair today eating breakfast denying pain.  He inquired as to where his legs were this morning said that there are maneuvers of breakfast looked good.  He is in agreement and understanding with the plan to proceed with endoscopic evaluation on Monday had no further questions today.  He again states he had episode of the contractions muscle pain that lasted a brief moment was generally uncomfortable for him.  Objective:  Vital signs in last 24 hours: Vitals:   03/07/19 2137 03/08/19 0515 03/08/19 2047 03/09/19 0557  BP: (!) 153/67 136/63 (!) 155/67 (!) 171/67  Pulse: 72 66 77 66  Resp: 18  19 19   Temp: (!) 97.4 F (36.3 C) 97.8 F (36.6 C) 98.4 F (36.9 C) (!) 97.3 F (36.3 C)  TempSrc: Oral Oral Oral Oral  SpO2: 94% 95% 96% 99%  Weight:      Height:       General: A/O x4, in no acute distress, afebrile, nondiaphoretic Cardio: RRR, no mrg's  Pulmonary: CTA bilaterally, no wheezing or crackles  Abdomen: Bowel sounds normal, soft, nontender  Psych: Appropriate affect, not depressed in appearance, engages well  Assessment/Plan:  Principal Problem:   Streptococcal bacteremia Active Problems:   Essential hypertension   Dizziness   Pneumonia   AKI (acute kidney injury) [facility])  Mr. Kristeen Miss is an 85 year old male with past medical history notable for HTN, BPH, CKD stage IIIa, CVA in 2019 and chronic pain who presented from an ALF with a 1 to 2-week history of fatigue and dysphagia with solid foods.  Initially treated for aspiration pneumonia and was found to have a strep bovis positive blood culture raising concern for endocarditis and colonic malignancy.  He is being evaluated with TEE, colonoscopy and will require esophageal dilation due to esophageal stricture as well.  Disposition pending completion of said procedures any potential resulting findings.  A/P: Strept Bovis bacteremia: Aspiration pneumonia: Improving.  He is continuing on  ceftriaxone and as remained fever free since admission.  Rigors persist raising concern for endocarditis.  -Repeat blood cultures from the 15th are negative to date -CBC today absent leukocytosis with stable hemoglobin -TEE Monday/Tuesday per cardiology -Colonoscopy and upper endoscopy per GI on Monday, bowel prep has been ordered. -Holding Plavix for 5 days before esophageal dilation -Continue ceftriaxone -Repeat CBC in a.m.  CODE STATUS: Full VTE prophylaxis: Lovenox Fluids:/A Diet: Heart healthy Dispo: Anticipated discharge in approximately 3-4 day(s).   Kathi Ludwig, MD 03/09/2019, 6:48 AM Pager: # [phone]

## 2019-03-10 LAB — CBC
HCT: 34.9 % — ABNORMAL LOW (ref 39.0–52.0)
Hemoglobin: 11.3 g/dL — ABNORMAL LOW (ref 13.0–17.0)
MCH: 31.2 pg (ref 26.0–34.0)
MCHC: 32.4 g/dL (ref 30.0–36.0)
MCV: 96.4 fL (ref 80.0–100.0)
Platelets: 208 10*3/uL (ref 150–400)
RBC: 3.62 MIL/uL — ABNORMAL LOW (ref 4.22–5.81)
RDW: 14 % (ref 11.5–15.5)
WBC: 9.6 10*3/uL (ref 4.0–10.5)
nRBC: 0 % (ref 0.0–0.2)

## 2019-03-10 LAB — BASIC METABOLIC PANEL
Anion gap: 12 (ref 5–15)
BUN: 36 mg/dL — ABNORMAL HIGH (ref 8–23)
CO2: 23 mmol/L (ref 22–32)
Calcium: 8.2 mg/dL — ABNORMAL LOW (ref 8.9–10.3)
Chloride: 100 mmol/L (ref 98–111)
Creatinine, Ser: 1.67 mg/dL — ABNORMAL HIGH (ref 0.61–1.24)
GFR calc Af Amer: 42 mL/min — ABNORMAL LOW (ref >60)
GFR calc non Af Amer: 36 mL/min — ABNORMAL LOW (ref >60)
Glucose, Bld: 94 mg/dL (ref 70–99)
Potassium: 4.3 mmol/L (ref 3.5–5.1)
Sodium: 135 mmol/L (ref 135–145)

## 2019-03-10 LAB — CULTURE, BLOOD (ROUTINE X 2)
Culture: NO GROWTH
Culture: NO GROWTH
Special Requests: ADEQUATE
Special Requests: ADEQUATE

## 2019-03-10 MED ORDER — WHITE PETROLATUM EX OINT
TOPICAL_OINTMENT | CUTANEOUS | Status: AC
  Administered 2019-03-10: 0.2
  Filled 2019-03-10: qty 28.35

## 2019-03-10 NOTE — Progress Notes (Signed)
Gastroenterology Inpatient Follow-up Note   PATIENT IDENTIFICATION  Zachary [name] is a 85 y.o. male with a pmh significant for CAD, arthritis, cataracts, chronic renal insufficiency, diverticulosis, GERD, obesity, CHF.  GI asked to evaluate patient in setting of strep bovis infection and with esophageal dysphagia. Hospital Day: 10  SUBJECTIVE  Patient without complaints today. He is ready for the preparation and for tomorrow's procedures. No new abdominal pain. Had some mild dysphagia yesterday but that has since gone away no odynophagia.  OBJECTIVE  Scheduled Inpatient Medications:  . amLODipine  10 mg Oral Daily  . aspirin EC  81 mg Oral Daily  . atorvastatin  40 mg Oral q1800  . diclofenac sodium  2 g Topical TID  . feeding supplement (ENSURE ENLIVE)  237 mL Oral TID BM  . finasteride  5 mg Oral Daily  . fluticasone  2 spray Each Nare Daily  . furosemide  40 mg Oral Daily  . lisinopril  20 mg Oral Daily  . methadone  5 mg Oral Q8H  . mirtazapine  30 mg Oral QHS  . pantoprazole  40 mg Oral Daily  . peg 3350 powder  0.5 kit Oral Once   And  . peg 3350 powder  0.5 kit Oral Once  . polyethylene glycol  68 g Oral Once  . polyvinyl alcohol  2 drop Both Eyes TID  . risperiDONE  0.25 mg Oral BID  . senna-docusate  2 tablet Oral BID  . sertraline  150 mg Oral Daily  . sodium chloride flush  3 mL Intravenous Q12H  . tamsulosin  0.4 mg Oral Daily   Continuous Inpatient Infusions:  . cefTRIAXone (ROCEPHIN)  IV 2 g (03/10/19 1015)   PRN Inpatient Medications: acetaminophen **OR** acetaminophen, albuterol, labetalol, LORazepam, traMADol   Physical Examination  Temp:  [98 F (36.7 C)-98.4 F (36.9 C)] 98.4 F (36.9 C) (09/20 1451) Pulse Rate:  [64-87] 87 (09/20 1451) Resp:  [18-19] 18 (09/20 1451) BP: (139-172)/(58-83) 139/83 (09/20 1451) SpO2:  [96 %-97 %] 97 % (09/20 1451) Temp (24hrs), Avg:98.3 F (36.8 C), Min:98 F (36.7 C), Max:98.4 F (36.9 C)  Weight: 97.5 kg  GEN: No acute distress, chronically ill-appearing but nontoxic PSYCH: Cooperative EYE: Conjunctivae pink, sclerae anicteric  ENT: MMM CV: Non-tachycardic RESP: No wheezing appreciated GI: Obese, rounded, NABS, nontender today, no rebound or guarding MSK/EXT: Bilateral lower extremity edema present SKIN: No jaundice NEURO:  Alert & Oriented x 3, no focal deficits   Review of Data   Laboratory Studies   Recent Labs  Lab 03/10/19 0416  NA 135  K 4.3  CL 100  CO2 23  BUN 36*  CREATININE 1.67*  GLUCOSE 94  CALCIUM 8.2*   No results for input(s): AST, ALT, GGT, ALKPHOS in the last 168 hours.  Invalid input(s): TBILI, CONJBILI, ALB  Recent Labs  Lab 03/08/19 0227 03/09/19 0243 03/10/19 0416  WBC 8.5 9.1 9.6  HGB 11.3* 10.9* 11.3*  HCT 35.2* 34.2* 34.9*  PLT 198 212 208   No results for input(s): APTT, INR in the last 168 hours. Computed MELD-Na score unavailable. Necessary lab results were not found in the last year. Computed MELD score unavailable. Necessary lab results were not found in the last year.  Imaging Studies  No results found.   ASSESSMENT  [name] is a 85 y.o. male with a pmh significant for CAD, arthritis, cataracts, chronic renal insufficiency, diverticulosis, GERD, obesity, CHF.  GI asked to evaluate patient in setting  of strep bovis infection and with esophageal dysphagia.  We will proceed with the patient's preparation today in anticipation of his EGD with possible dilation and colonoscopy diagnostic tomorrow.  Preparation orders are in the chart and he is scheduled for tomorrow.  The risks and benefits of endoscopic evaluation were discussed with the patient; these include but are not limited to the risk of perforation, infection, bleeding, missed lesions, lack of diagnosis, severe illness requiring hospitalization, as well as anesthesia and sedation related illnesses.  The patient is agreeable to proceed.   PLAN/RECOMMENDATIONS  MoviPrep  ordered Dulcolax ordered N.p.o. at midnight EGD/colonoscopy tomorrow with Dr. Loletha Carrow Plavix restart to be determined after Dr. Loletha Carrow completes procedures   Please page/call with questions or concerns.   Justice Britain, MD [facility] Office # [phone]    LOS: 9 days  Irving Copas  03/10/2019, 8:11 PM

## 2019-03-10 NOTE — H&P (View-Only) (Signed)
Gastroenterology Inpatient Follow-up Note   PATIENT IDENTIFICATION  Zachary [name] is a 85 y.o. male with a pmh significant for CAD, arthritis, cataracts, chronic renal insufficiency, diverticulosis, GERD, obesity, CHF.  GI asked to evaluate patient in setting of strep bovis infection and with esophageal dysphagia. Hospital Day: 10  SUBJECTIVE  Patient without complaints today. He is ready for the preparation and for tomorrow's procedures. No new abdominal pain. Had some mild dysphagia yesterday but that has since gone away no odynophagia.  OBJECTIVE  Scheduled Inpatient Medications:  . amLODipine  10 mg Oral Daily  . aspirin EC  81 mg Oral Daily  . atorvastatin  40 mg Oral q1800  . diclofenac sodium  2 g Topical TID  . feeding supplement (ENSURE ENLIVE)  237 mL Oral TID BM  . finasteride  5 mg Oral Daily  . fluticasone  2 spray Each Nare Daily  . furosemide  40 mg Oral Daily  . lisinopril  20 mg Oral Daily  . methadone  5 mg Oral Q8H  . mirtazapine  30 mg Oral QHS  . pantoprazole  40 mg Oral Daily  . peg 3350 powder  0.5 kit Oral Once   And  . peg 3350 powder  0.5 kit Oral Once  . polyethylene glycol  68 g Oral Once  . polyvinyl alcohol  2 drop Both Eyes TID  . risperiDONE  0.25 mg Oral BID  . senna-docusate  2 tablet Oral BID  . sertraline  150 mg Oral Daily  . sodium chloride flush  3 mL Intravenous Q12H  . tamsulosin  0.4 mg Oral Daily   Continuous Inpatient Infusions:  . cefTRIAXone (ROCEPHIN)  IV 2 g (03/10/19 1015)   PRN Inpatient Medications: acetaminophen **OR** acetaminophen, albuterol, labetalol, LORazepam, traMADol   Physical Examination  Temp:  [98 F (36.7 C)-98.4 F (36.9 C)] 98.4 F (36.9 C) (09/20 1451) Pulse Rate:  [64-87] 87 (09/20 1451) Resp:  [18-19] 18 (09/20 1451) BP: (139-172)/(58-83) 139/83 (09/20 1451) SpO2:  [96 %-97 %] 97 % (09/20 1451) Temp (24hrs), Avg:98.3 F (36.8 C), Min:98 F (36.7 C), Max:98.4 F (36.9 C)  Weight: 97.5 kg  GEN: No acute distress, chronically ill-appearing but nontoxic PSYCH: Cooperative EYE: Conjunctivae pink, sclerae anicteric  ENT: MMM CV: Non-tachycardic RESP: No wheezing appreciated GI: Obese, rounded, NABS, nontender today, no rebound or guarding MSK/EXT: Bilateral lower extremity edema present SKIN: No jaundice NEURO:  Alert & Oriented x 3, no focal deficits   Review of Data   Laboratory Studies   Recent Labs  Lab 03/10/19 0416  NA 135  K 4.3  CL 100  CO2 23  BUN 36*  CREATININE 1.67*  GLUCOSE 94  CALCIUM 8.2*   No results for input(s): AST, ALT, GGT, ALKPHOS in the last 168 hours.  Invalid input(s): TBILI, CONJBILI, ALB  Recent Labs  Lab 03/08/19 0227 03/09/19 0243 03/10/19 0416  WBC 8.5 9.1 9.6  HGB 11.3* 10.9* 11.3*  HCT 35.2* 34.2* 34.9*  PLT 198 212 208   No results for input(s): APTT, INR in the last 168 hours. Computed MELD-Na score unavailable. Necessary lab results were not found in the last year. Computed MELD score unavailable. Necessary lab results were not found in the last year.  Imaging Studies  No results found.   ASSESSMENT  Zachary [name] is a 85 y.o. male with a pmh significant for CAD, arthritis, cataracts, chronic renal insufficiency, diverticulosis, GERD, obesity, CHF.  GI asked to evaluate patient in setting  of strep bovis infection and with esophageal dysphagia.  We will proceed with the patient's preparation today in anticipation of his EGD with possible dilation and colonoscopy diagnostic tomorrow.  Preparation orders are in the chart and he is scheduled for tomorrow.  The risks and benefits of endoscopic evaluation were discussed with the patient; these include but are not limited to the risk of perforation, infection, bleeding, missed lesions, lack of diagnosis, severe illness requiring hospitalization, as well as anesthesia and sedation related illnesses.  The patient is agreeable to proceed.   PLAN/RECOMMENDATIONS  MoviPrep  ordered Dulcolax ordered N.p.o. at midnight EGD/colonoscopy tomorrow with Dr. Loletha Carrow Plavix restart to be determined after Dr. Loletha Carrow completes procedures   Please page/call with questions or concerns.   Justice Britain, MD [facility] Office # [phone]    LOS: 9 days  Irving Copas  03/10/2019, 8:11 PM

## 2019-03-10 NOTE — Progress Notes (Signed)
    Subjective: Patient seen at the bedside this afternoon.  He says he had an episode of shaking last night but it was less intense and much shorter than the previous ones.  He says his lower leg pain has also improved.  No other complaints at this time.  Understands that he will get the procedures done tomorrow.  Objective:  Vital signs in last 24 hours: Vitals:   03/09/19 0557 03/09/19 1522 03/09/19 2213 03/10/19 0300  BP: (!) 171/67 (!) 154/85 (!) 172/71 (!) 169/58  Pulse: 66 78 69 64  Resp: 19 18 18 19   Temp: (!) 97.3 F (36.3 C) 98.4 F (36.9 C) 98.4 F (36.9 C) 98 F (36.7 C)  TempSrc: Oral Oral Oral Oral  SpO2: 99% 96% 96% 96%  Weight:      Height:       Physical exam: General: Resting comfortably in bed, NAD HEENT: NCAT CV: Regular rate and rhythm.  S1 and S2 normal no murmurs appreciated PULM: CTAB ABD: Soft and nontender NEURO: Alert and oriented x4, no focal deficits  Assessment/Plan:  Principal Problem:   Streptococcal bacteremia Active Problems:   Essential hypertension   Dizziness   Pneumonia   AKI (acute kidney injury) ([place])  In summary Zachary [name] is a 85 year old male with a history of HTN, BPH, CKD stage III, CVA in 2019 and chronic pain who presented from an ALF with a 1 to 2-week history of fatigue and dysphasia with solid foods. Pt was treated for aspiration pneumonia and course was complicated by Strep bovis + cultures, raising concerns for endocarditis and malignancy.   #Aspiration pneumonia #Strep bovis bacteremia: Pt has completed treatment course for aspiration pneumonia with Augmentin.  However, his blood cultures grew Group D Strep, raising concerns for potential endocarditis or malignancy. Patient endorses having night time "shakes" which may be rigors consistent with a dwelling infection. TTE was obtained and unremarkable. - TEE scheduled for tomorrow with cardiology - EGD & colonoscopy scheduled for tomorrow with GI. Bowel prep ordered -  Continue ceftriaxone  #Hx CVA in 2019 #HTN: Pt has had elevated pressures intermittently, 189/76 this AM. - Amlodipine 10 mg daily - Lisinopril 20 mg daily - Labetalol 100 mg 3 times daily PRN - aspirin 81 mg, holding Plavix per GI request.   - atorvastatin 40 mg daily   #BPH - Flomax 0.4 mg daily - Finasteride 5 mg daily   #Chronic pain: On chronic pain medications at baseline - Methadone 5 mg TID - Tramadol 50 mg once daily PRN   #FEN/GI - Diet: NPO at midnight  - IVF: None  #VTE ppx: SQ Lovenox    #Code status: Full   #Dispo: Pending medical course.  [name], MD Internal Medicine, PGY1 Pager: [phone]  03/10/2019,12:01 PM

## 2019-03-11 ENCOUNTER — Encounter (HOSPITAL_COMMUNITY)
Admission: EM | Disposition: A | Discharge status: Home / Self Care | Payer: Self-pay | Source: Home / Self Care | Attending: Internal Medicine

## 2019-03-11 ENCOUNTER — Other Ambulatory Visit: Payer: Self-pay

## 2019-03-11 ENCOUNTER — Inpatient Hospital Stay (HOSPITAL_COMMUNITY): Payer: Medicare Other | Admitting: Anesthesiology

## 2019-03-11 ENCOUNTER — Ambulatory Visit: Payer: Medicare Other

## 2019-03-11 DIAGNOSIS — R1314 Dysphagia, pharyngoesophageal phase: Secondary | ICD-10-CM

## 2019-03-11 DIAGNOSIS — K635 Polyp of colon: Secondary | ICD-10-CM

## 2019-03-11 HISTORY — PX: BIOPSY: SHX5522

## 2019-03-11 HISTORY — PX: ESOPHAGOGASTRODUODENOSCOPY (EGD) WITH PROPOFOL: SHX5813

## 2019-03-11 HISTORY — PX: COLONOSCOPY WITH PROPOFOL: SHX5780

## 2019-03-11 LAB — CBC
HCT: 37.5 % — ABNORMAL LOW (ref 39.0–52.0)
Hemoglobin: 12.2 g/dL — ABNORMAL LOW (ref 13.0–17.0)
MCH: 30.8 pg (ref 26.0–34.0)
MCHC: 32.5 g/dL (ref 30.0–36.0)
MCV: 94.7 fL (ref 80.0–100.0)
Platelets: 239 10*3/uL (ref 150–400)
RBC: 3.96 MIL/uL — ABNORMAL LOW (ref 4.22–5.81)
RDW: 13.8 % (ref 11.5–15.5)
WBC: 11.6 10*3/uL — ABNORMAL HIGH (ref 4.0–10.5)
nRBC: 0 % (ref 0.0–0.2)

## 2019-03-11 LAB — BASIC METABOLIC PANEL
Anion gap: 9 (ref 5–15)
BUN: 32 mg/dL — ABNORMAL HIGH (ref 8–23)
CO2: 22 mmol/L (ref 22–32)
Calcium: 8.3 mg/dL — ABNORMAL LOW (ref 8.9–10.3)
Chloride: 104 mmol/L (ref 98–111)
Creatinine, Ser: 2.05 mg/dL — ABNORMAL HIGH (ref 0.61–1.24)
GFR calc Af Amer: 33 mL/min — ABNORMAL LOW (ref >60)
GFR calc non Af Amer: 28 mL/min — ABNORMAL LOW (ref >60)
Glucose, Bld: 118 mg/dL — ABNORMAL HIGH (ref 70–99)
Potassium: 3.9 mmol/L (ref 3.5–5.1)
Sodium: 135 mmol/L (ref 135–145)

## 2019-03-11 SURGERY — COLONOSCOPY WITH PROPOFOL
Anesthesia: Monitor Anesthesia Care

## 2019-03-11 MED ORDER — BUTAMBEN-TETRACAINE-BENZOCAINE 2-2-14 % EX AERO
INHALATION_SPRAY | CUTANEOUS | Status: DC | PRN
  Administered 2019-03-11: 09:00:00 2 via TOPICAL

## 2019-03-11 MED ORDER — PHENYLEPHRINE HCL (PRESSORS) 10 MG/ML IV SOLN
INTRAVENOUS | Status: DC | PRN
  Administered 2019-03-11: 100 ug via INTRAVENOUS

## 2019-03-11 MED ORDER — PROPOFOL 500 MG/50ML IV EMUL
INTRAVENOUS | Status: DC | PRN
  Administered 2019-03-11: 75 ug/kg/min via INTRAVENOUS

## 2019-03-11 MED ORDER — SODIUM CHLORIDE 0.9 % IV SOLN
INTRAVENOUS | Status: DC | PRN
  Administered 2019-03-11: 08:00:00 500 mL via INTRAVENOUS

## 2019-03-11 SURGICAL SUPPLY — 25 items

## 2019-03-11 NOTE — Transfer of Care (Signed)
Immediate Anesthesia Transfer of Care Note  Patient: Zachary [name]  Procedure(s) Performed: COLONOSCOPY WITH PROPOFOL (N/A ) ESOPHAGOGASTRODUODENOSCOPY (EGD) WITH PROPOFOL (N/A ) BIOPSY  Patient Location: PACU  Anesthesia Type:MAC  Level of Consciousness: awake, alert  and oriented  Airway & Oxygen Therapy: Patient Spontanous Breathing  Post-op Assessment: Report given to RN and Post -op Vital signs reviewed and stable  Post vital signs: Reviewed and stable  Last Vitals:  Vitals Value Taken Time  BP 136/53 03/11/19 0952  Temp 36.3 C 03/11/19 0952  Pulse 67 03/11/19 0955  Resp 18 03/11/19 0955  SpO2 96 % 03/11/19 0955  Vitals shown include unvalidated device data.  Last Pain:  Vitals:   03/11/19 0952  TempSrc: Temporal  PainSc: 0-No pain      Patients Stated Pain Goal: 0 (85/90/93 1121)  Complications: No apparent anesthesia complications

## 2019-03-11 NOTE — Progress Notes (Signed)
Subjective: Patient seen at the bedside after his EGD and colonoscopy.  Patient says that the GI doctors told him the procedure went well and did not show evidence of colon cancer.  Patient is feeling well overall.  Does complain of a lesion on his left elbow with sore.  We told him it appeared to be a callus and to ask the nurse to apply a dressing to it.  All questions were answered to the patient's satisfaction.  Objective: EGD: FINDINGS: - A small hiatal hernia was present. - The exam of the esophagus was otherwise normal. There was no resistance passing the scope through the EGJ. - Patchy erythematous and diffusely atrophic mucosa without bleeding was found in the entire examined stomach. Biopsies were taken with a cold forceps for histology (antrum and body) to rule out H. pylori. - The exam of the stomach was otherwise normal. - The cardia and gastric fundus were normal on retroflexion.  Colonoscopy: FINDINGS: - The examined portion of the ileum was normal. - Three 3 to 6 mm polyps in the ascending colon. - The examination was otherwise normal on direct and retroflexion views. - No specimens collected. - No colon malignancy to account for bacteremia.  EGD & Colonoscopy: No esophogeal stricture, no colon mass  Vital signs in last 24 hours: Vitals:   03/11/19 0812 03/11/19 0952 03/11/19 1005 03/11/19 1023  BP: (!) 168/57 (!) 136/53 (!) 151/50 (!) 152/55  Pulse: 68 69 61 64  Resp: 17 16 15 18   Temp: (!) 97.1 F (36.2 C) (!) 97.3 F (36.3 C)  (!) 97.5 F (36.4 C)  TempSrc: Oral Temporal  Oral  SpO2: 96% 97% 95% 99%  Weight:      Height:       Physical exam: General: Resting comfortably in bed, NAD HEENT: NCAT CV: Regular rate and rhythm.  S1 and S2 normal no murmurs appreciated. No lower extremity edema PULM: Clear to auscultation bilaterally ABD: Bowel sounds present, no tenderness to palpation NEURO: Alert and oriented x4, no focal deficits  Assessment/Plan:  Principal Problem:   Streptococcal bacteremia Active Problems:   Essential hypertension   Dizziness   Pneumonia   AKI (acute kidney injury) ([place])  In summary Zachary [name] is a 85 year old male with a history of HTN, BPH, CKD stage III, CVA in 2019 and chronic pain who presented from an ALF with a 1 to 2-week history of fatigue and dysphasia with solid foods. Pt was treated for aspiration pneumonia and course was complicated by Strep bovis + cultures, raising concerns for endocarditis and malignancy.  Patient received EGD and colonoscopy today and did not show evidence of malignancy. Still needs TEE to rule out endocarditis.  #Aspiration pneumonia #Strep bovis bacteremia: EGD and colonoscopy was performed today and did not show evidence of malignancy. Biopsies of gastric mucosa were taken. TTE was obtained and unremarkable, needs TEE, cardiology on board. - Appreciate cardiology recommendations for TEE - Continue ceftriaxone - Follow-up gastric biopsies  #Hx CVA in 2019 #HTN: Pt has had elevated pressures intermittently, 189/76 this AM. - Amlodipine 10 mg daily - Lisinopril 20 mg daily - Labetalol 100 mg 3 times daily PRN - aspirin 81 mg, restart Plavix tomorrow. - atorvastatin 40 mg daily   #BPH - Flomax 0.4 mg daily - Finasteride 5 mg daily   #Chronic pain: On chronic pain medications at baseline - Methadone 5 mg TID - Tramadol 50 mg once daily PRN   #FEN/GI - Diet:2 g sodium  -  IVF: None  #VTE ppx: SQ Lovenox    #Code status: Full   #Dispo: Pending medical course  [name], MD Internal Medicine, PGY1 Pager: [phone]  03/11/2019,2:19 PM

## 2019-03-11 NOTE — Care Management Important Message (Signed)
Important Message  Patient Details  Name: Zachary [name] MRN: [id number] Date of Birth: 03-08-1930   Medicare Important Message Given:  Yes     Memory Argue 03/11/2019, 5:02 PM

## 2019-03-11 NOTE — Progress Notes (Signed)
Physical Therapy Treatment Patient Details Name: Zachary [name] MRN: [id number] DOB: 1929/12/21 Today's Date: 03/11/2019    History of Present Illness Mr. Hilliker is a 85 y.o. male with a pmh significant for CAD, arthritis, cataracts, chronic renal insufficiency, diverticulosis, GERD, obesity, CHF.  GI asked to evaluate patient in setting of strep bovis infection and with esophageal dysphagia.    PT Comments    Pt reports not wanting to go home - anytime soon.  Pt wanting MD to find out why he is having spells at night.  Pt has had ST for swallowing education - they signed off 9-17 with education complete.  I questioned pt today on some safe swallowing technques - he had no idea what I was talking about and said no one ever taught him this.  Pt wheezing with SOB - I educated him on use of inspirometer.  Pt weak when up but did well.  He needs more walking and OOB.  PT will follow pt on acute while he is here.   Follow Up Recommendations  Home health PT     Equipment Recommendations  Rolling walker with 5" wheels    Recommendations for Other Services       Precautions / Restrictions Precautions Precautions: Fall Restrictions Weight Bearing Restrictions: No    Mobility  Bed Mobility Overal bed mobility: Modified Independent Bed Mobility: Supine to Sit Rolling: Supervision   Supine to sit: HOB elevated     General bed mobility comments: Pt wanting help to get up - but able to get up on his own  Transfers Overall transfer level: Needs assistance Equipment used: Rolling walker (2 wheeled) Transfers: Sit to/from Stand           General transfer comment: Pt able to stand up on his own from the bed.  pt complained of being a little dizzy once standing.  Ambulation/Gait Ambulation/Gait assistance: Min guard Gait Distance (Feet): 50 Feet Assistive device: Rolling walker (2 wheeled) Gait Pattern/deviations: Step-through pattern;Decreased step length - right;Decreased step  length - left;Trunk flexed     General Gait Details: Pt with no compliants of pain - some dizziness.  pt educated in more erect posture. He did well with turning and backing up.  pt just complains of feeling weak.   Stairs             Wheelchair Mobility    Modified Rankin (Stroke Patients Only)       Balance Overall balance assessment: Needs assistance;History of Falls             Standing balance comment: requires UE support - pt standing with RW only                            Cognition Arousal/Alertness: Awake/alert Behavior During Therapy: WFL for tasks assessed/performed Overall Cognitive Status: Within Functional Limits for tasks assessed                                 General Comments: Once i talked him into getting up - pt cooperative.  Pt wanting more answers before DC      Exercises      General Comments General comments (skin integrity, edema, etc.): Pt SOB after walking but O2 sats 98% on RA.  I could hear some wheezing when SOB.  I educated pt on using inspirometer - he did some wet coughing  when using this.  I encouraged him to continue it every hour while up      Pertinent Vitals/Pain Pain Assessment: No/denies pain Pain Location: no specific complaints of pain today Pain Intervention(s): Monitored during session    Home Living                      Prior Function            PT Goals (current goals can now be found in the care plan section) Progress towards PT goals: Progressing toward goals    Frequency    Min 3X/week      PT Plan Current plan remains appropriate    Co-evaluation              AM-PAC PT "6 Clicks" Mobility   Outcome Measure  Help needed turning from your back to your side while in a flat bed without using bedrails?: None Help needed moving from lying on your back to sitting on the side of a flat bed without using bedrails?: A Little Help needed moving to and from a  bed to a chair (including a wheelchair)?: A Little Help needed standing up from a chair using your arms (e.g., wheelchair or bedside chair)?: A Little Help needed to walk in hospital room?: A Little Help needed climbing 3-5 steps with a railing? : A Lot 6 Click Score: 18    End of Session Equipment Utilized During Treatment: Gait belt Activity Tolerance: Patient tolerated treatment well Patient left: in chair;with call bell/phone within reach;with chair alarm set Nurse Communication: Mobility status PT Visit Diagnosis: Unsteadiness on feet (R26.81);Repeated falls (R29.6);Muscle weakness (generalized) (M62.81)     Time: 3716-9678 PT Time Calculation (min) (ACUTE ONLY): 30 min  Charges:  $Gait Training: 23-37 mins                     03/11/2019   Rande Lawman, PT    Loyal Buba 03/11/2019, 12:22 PM

## 2019-03-11 NOTE — Anesthesia Preprocedure Evaluation (Addendum)
Anesthesia Evaluation  Patient identified by MRN, date of birth, ID band Patient awake    Reviewed: Allergy & Precautions, NPO status , Patient's Chart, lab work & pertinent test results  Airway Mallampati: I  TM Distance: >3 FB Neck ROM: Full    Dental no notable dental hx. (+) Edentulous Lower, Edentulous Upper   Pulmonary pneumonia, former smoker,    Pulmonary exam normal breath sounds clear to auscultation       Cardiovascular hypertension, Pt. on medications + Peripheral Vascular Disease  Normal cardiovascular exam Rhythm:Regular Rate:Normal     Neuro/Psych PSYCHIATRIC DISORDERS Depression CVA    GI/Hepatic GERD  ,  Endo/Other    Renal/GU Renal InsufficiencyRenal disease     Musculoskeletal Hx of Chronic pain   Abdominal (+) + obese,   Peds  Hematology   Anesthesia Other Findings On Plavix ? Last  Reproductive/Obstetrics                           Anesthesia Physical Anesthesia Plan  ASA: IV  Anesthesia Plan: MAC   Post-op Pain Management:    Induction:   PONV Risk Score and Plan:   Airway Management Planned: Nasal Cannula and Natural Airway  Additional Equipment: None  Intra-op Plan:   Post-operative Plan:   Informed Consent: I have reviewed the patients History and Physical, chart, labs and discussed the procedure including the risks, benefits and alternatives for the proposed anesthesia with the patient or authorized representative who has indicated his/her understanding and acceptance.     Dental advisory given  Plan Discussed with: CRNA  Anesthesia Plan Comments:        Anesthesia Quick Evaluation

## 2019-03-11 NOTE — Progress Notes (Signed)
Patient to Endo via bed.

## 2019-03-11 NOTE — Anesthesia Procedure Notes (Signed)
Procedure Name: MAC Date/Time: 03/11/2019 9:12 AM Performed by: Eligha Bridegroom, CRNA Pre-anesthesia Checklist: Patient identified, Emergency Drugs available, Suction available, Patient being monitored and Timeout performed Patient Re-evaluated:Patient Re-evaluated prior to induction Oxygen Delivery Method: Nasal cannula Preoxygenation: Pre-oxygenation with 100% oxygen Induction Type: IV induction

## 2019-03-11 NOTE — Progress Notes (Signed)
Patient returned to room. S/P EGD and colonoscopy. Alert. No c/o's pain. Check in completed.

## 2019-03-11 NOTE — Progress Notes (Signed)
Internal Medicine Attending:   I saw and examined the patient. I reviewed the resident's note and I agree with the resident's findings and plan as documented in the resident's note.  Patient states that he feels well today.  He was seen after his EGD and colonoscopy.  He did complain of some pain secondary to a lesion on his left elbow.  He feels well otherwise.  Patient initially made to the hospital with fatigue and dysphasia and was found to have strep bovis bacteremia concerning for possible underlying colonic malignancy.  Patient is now status post colonoscopy with no evidence of malignancy.  Patient also had an EGD today secondary to possible esophageal stricture but no stricture was noted on the EGD.  Patient likely has presbyesophagus secondary to aging.  Will obtain SLP evaluation.  We will continue with ceftriaxone to complete a 14-day course.  Patient will requires a TEE to rule out endocarditis.  We will attempt to arrange this with cardiology.  No further work-up at this time.  Patient should be stable for DC to SNF once his TEE is completed.  Will resume his Plavix tomorrow.  Of note, patient did have a mildly worsening creatinine today.  We will continue to monitor BMP daily and consider holding his lisinopril if he continues to worsen.

## 2019-03-11 NOTE — Op Note (Signed)
[facility] Patient Name: Zachary [name] Procedure Date : 03/11/2019 MRN: [id number] Attending MD: Estill Cotta. Loletha Carrow , MD Date of Birth: 1930/03/05 CSN: [id number] Age: 85 Admit Type: Inpatient Procedure:                Upper GI endoscopy Indications:              Dysphagia, Abnormal UGI series (delayed passage of                            barium tablet at [facility] - ? stricture) Providers:                Estill Cotta. Loletha Carrow, MD, Jobe Igo, RN, Josie Dixon, RN, Marguerita Merles, Technician, Lazaro Arms, Technician Referring MD:              Medicines:                Monitored Anesthesia Care Complications:            No immediate complications. Estimated Blood Loss:     Estimated blood loss was minimal. Procedure:                Pre-Anesthesia Assessment:                           - Prior to the procedure, a History and Physical                            was performed, and patient medications and                            allergies were reviewed. The patient's tolerance of                            previous anesthesia was also reviewed. The risks                            and benefits of the procedure and the sedation                            options and risks were discussed with the patient.                            All questions were answered, and informed consent                            was obtained. Prior Anticoagulants: The patient has                            taken Plavix (clopidogrel), last dose was 5 days  prior to procedure. ASA Grade Assessment: III - A                            patient with severe systemic disease. After                            reviewing the risks and benefits, the patient was                            deemed in satisfactory condition to undergo the                            procedure.                           After obtaining informed consent, the  endoscope was                            passed under direct vision. Throughout the                            procedure, the patient's blood pressure, pulse, and                            oxygen saturations were monitored continuously. The                            [id number]-H190 ([id number]) Olympus gastroscope was                            introduced through the mouth, and advanced to the                            second part of duodenum. The upper GI endoscopy was                            accomplished without difficulty. The patient                            tolerated the procedure well. Scope In: Scope Out: Findings:      The larynx was normal.      A small hiatal hernia was present.      The exam of the esophagus was otherwise normal. There was no resistance       passing the scope through the EGJ.      Patchy erythematous and diffusely atrophic mucosa without bleeding was       found in the entire examined stomach. Biopsies were taken with a cold       forceps for histology (antrum and body) to rule out H. pylori.      The exam of the stomach was otherwise normal.      The cardia and gastric fundus were normal on retroflexion.      The examined duodenum was normal. Impression:               - Normal larynx.                           -  Small hiatal hernia.                           - Erythematous mucosa in the stomach. Biopsied.                           - Normal examined duodenum.                           Dysphagia most likely from presbyesophagus and                            generalized deconditioning. No focal stricture. Recommendation:           - Return patient to hospital ward for ongoing care.                           - Resume regular diet.                           - Continue present medications.                           - See the other procedure note for documentation of                            additional recommendations.                           - Await  pathology results. Procedure Code(s):        --- Professional ---                           [phone], Esophagogastroduodenoscopy, flexible,                            transoral; with biopsy, single or multiple Diagnosis Code(s):        --- Professional ---                           K44.9, Diaphragmatic hernia without obstruction or                            gangrene                           K31.89, Other diseases of stomach and duodenum                           R13.10, Dysphagia, unspecified                           R93.3, Abnormal findings on diagnostic imaging of                            other parts of digestive tract CPT copyright 2019 American Medical Association. All rights reserved. The codes documented in this report are preliminary and upon coder review may  be revised to meet current compliance requirements. Aemon Koeller L. Loletha Carrow, MD 03/11/2019 9:57:25 AM This report has been signed electronically. Number of Addenda: 0

## 2019-03-11 NOTE — Progress Notes (Signed)
EGD and colonoscopy reports in chart.  No esophageal stricture, no colon mass. Dysphagia appears to be presbyesophagus due to age and generalized deconditioning.  Consider speech-language pathology evaluation sees if not already done) if there are concerns of aspiration in order to give more thorough swallowing evaluation and recommendation on diet.  Per instructions, Plavix can be resumed 03/12/2019.  I called home number for the only contact I could find listed in his chart, his sister.  There was no answer.  GI signing off, call as need arises.

## 2019-03-11 NOTE — Op Note (Signed)
[facility] Patient Name: Zachary [name] Procedure Date : 03/11/2019 MRN: [id number] Attending MD: Estill Cotta. Loletha Carrow , MD Date of Birth: 09-21-1929 CSN: [id number] Age: 85 Admit Type: Inpatient Procedure:                Colonoscopy Indications:              strep gallolyticus bacteremia - rule out colon mass Providers:                Mallie Mussel L. Loletha Carrow, MD, Josie Dixon, RN, Jobe Igo, RN, Marguerita Merles, Technician, Lazaro Arms, Technician Referring MD:             [facility] Medicines:                Monitored Anesthesia Care Complications:            No immediate complications. Estimated Blood Loss:     Estimated blood loss: none. Procedure:                Pre-Anesthesia Assessment:                           - Prior to the procedure, a History and Physical                            was performed, and patient medications and                            allergies were reviewed. The patient's tolerance of                            previous anesthesia was also reviewed. The risks                            and benefits of the procedure and the sedation                            options and risks were discussed with the patient.                            All questions were answered, and informed consent                            was obtained. Prior Anticoagulants: The patient has                            taken Plavix (clopidogrel), last dose was 5 days                            prior to procedure. ASA Grade Assessment: III - A  patient with severe systemic disease. After                            reviewing the risks and benefits, the patient was                            deemed in satisfactory condition to undergo the                            procedure.                           - Prior to the procedure, a History and Physical                            was performed, and  patient medications and                            allergies were reviewed. The patient's tolerance of                            previous anesthesia was also reviewed. The risks                            and benefits of the procedure and the sedation                            options and risks were discussed with the patient.                            All questions were answered, and informed consent                            was obtained. Prior Anticoagulants: The patient has                            taken Plavix (clopidogrel), last dose was 5 days                            prior to procedure. ASA Grade Assessment: III - A                            patient with severe systemic disease. After                            reviewing the risks and benefits, the patient was                            deemed in satisfactory condition to undergo the                            procedure.  After obtaining informed consent, the colonoscope                            was passed under direct vision. Throughout the                            procedure, the patient's blood pressure, pulse, and                            oxygen saturations were monitored continuously. The                            [id number]-HQ190L ([id number] Olympus colonoscope was                            introduced through the anus and advanced to the the                            terminal ileum, with identification of the                            appendiceal orifice and IC valve. The colonoscopy                            was performed without difficulty. The patient                            tolerated the procedure well. The quality of the                            bowel preparation was good. The terminal ileum,                            ileocecal valve, appendiceal orifice, and rectum                            were photographed. Scope In: 9:31:28 AM Scope Out: 9:44:16 AM Scope Withdrawal Time: 0  hours 8 minutes 13 seconds  Total Procedure Duration: 0 hours 12 minutes 48 seconds  Findings:      The perianal and digital rectal examinations were normal. Patiet noted       to have skin breakdown on buttocks.      The terminal ileum appeared normal.      Three sessile polyps were found in the ascending colon. The polyps were       3 to 6 mm in size. Given patient's age, benign polyp appearance and need       to resume plavix soon, the polyps were not removed.      The exam was otherwise without abnormality on direct and retroflexion       views. Impression:               - The examined portion of the ileum was normal.                           - Three 3  to 6 mm polyps in the ascending colon.                           - The examination was otherwise normal on direct                            and retroflexion views.                           - No specimens collected.                           No colon malignancy to account for bacteremia. Recommendation:           - Resume regular diet.                           - Return patient to hospital ward for ongoing care.                           - Resume Plavix (clopidogrel) at prior dose                            tomorrow.                           - No repeat colonoscopy.                           - See the other procedure note for documentation of                            additional recommendations. Procedure Code(s):        --- Professional ---                           [phone], Colonoscopy, flexible; diagnostic, including                            collection of specimen(s) by brushing or washing,                            when performed (separate procedure) Diagnosis Code(s):        --- Professional ---                           K63.5, Polyp of colon CPT copyright 2019 American Medical Association. All rights reserved. The codes documented in this report are preliminary and upon coder review may  be revised to meet current  compliance requirements. Dreanna Kyllo L. Loletha Carrow, MD 03/11/2019 10:03:10 AM This report has been signed electronically. Number of Addenda: 0

## 2019-03-11 NOTE — Anesthesia Postprocedure Evaluation (Signed)
Anesthesia Post Note  Patient: Zachary [name]  Procedure(s) Performed: COLONOSCOPY WITH PROPOFOL (N/A ) ESOPHAGOGASTRODUODENOSCOPY (EGD) WITH PROPOFOL (N/A ) BIOPSY     Patient location during evaluation: Endoscopy Anesthesia Type: MAC Level of consciousness: awake and alert Pain management: pain level controlled Vital Signs Assessment: post-procedure vital signs reviewed and stable Respiratory status: spontaneous breathing, nonlabored ventilation, respiratory function stable and patient connected to nasal cannula oxygen Cardiovascular status: blood pressure returned to baseline and stable Postop Assessment: no apparent nausea or vomiting Anesthetic complications: no    Last Vitals:  Vitals:   03/11/19 0812 03/11/19 0952  BP: (!) 168/57 (!) 136/53  Pulse: 68 69  Resp: 17 16  Temp: (!) 36.2 C (!) 36.3 C  SpO2: 96% 97%    Last Pain:  Vitals:   03/11/19 0952  TempSrc: Temporal  PainSc: 0-No pain                 Barnet Glasgow

## 2019-03-11 NOTE — Interval H&P Note (Signed)
History and Physical Interval Note:  03/11/2019 9:04 AM  Zachary [name]  has presented today for surgery, with the diagnosis of evaluate swallowing issues and source of bacteria detected in blood.  The various methods of treatment have been discussed with the patient and family. After consideration of risks, benefits and other options for treatment, the patient has consented to  Procedure(s): COLONOSCOPY WITH PROPOFOL (N/A) ESOPHAGOGASTRODUODENOSCOPY (EGD) WITH PROPOFOL (N/A) BALLOON DILATION (N/A) as a surgical intervention.  The patient's history has been reviewed, patient examined, no change in status, stable for surgery.  I have reviewed the patient's chart and labs.  Questions were answered to the patient's satisfaction.     [name]

## 2019-03-11 NOTE — H&P (View-Only) (Signed)
Internal Medicine Attending:   I saw and examined the patient. I reviewed the resident's note and I agree with the resident's findings and plan as documented in the resident's note.  Patient states that he feels well today.  He was seen after his EGD and colonoscopy.  He did complain of some pain secondary to a lesion on his left elbow.  He feels well otherwise.  Patient initially made to the hospital with fatigue and dysphasia and was found to have strep bovis bacteremia concerning for possible underlying colonic malignancy.  Patient is now status post colonoscopy with no evidence of malignancy.  Patient also had an EGD today secondary to possible esophageal stricture but no stricture was noted on the EGD.  Patient likely has presbyesophagus secondary to aging.  Will obtain SLP evaluation.  We will continue with ceftriaxone to complete a 14-day course.  Patient will requires a TEE to rule out endocarditis.  We will attempt to arrange this with cardiology.  No further work-up at this time.  Patient should be stable for DC to SNF once his TEE is completed.  Will resume his Plavix tomorrow.  Of note, patient did have a mildly worsening creatinine today.  We will continue to monitor BMP daily and consider holding his lisinopril if he continues to worsen.

## 2019-03-12 DIAGNOSIS — D649 Anemia, unspecified: Secondary | ICD-10-CM

## 2019-03-12 LAB — BASIC METABOLIC PANEL
Anion gap: 11 (ref 5–15)
BUN: 37 mg/dL — ABNORMAL HIGH (ref 8–23)
CO2: 20 mmol/L — ABNORMAL LOW (ref 22–32)
Calcium: 8 mg/dL — ABNORMAL LOW (ref 8.9–10.3)
Chloride: 107 mmol/L (ref 98–111)
Creatinine, Ser: 1.99 mg/dL — ABNORMAL HIGH (ref 0.61–1.24)
GFR calc Af Amer: 34 mL/min — ABNORMAL LOW (ref >60)
GFR calc non Af Amer: 29 mL/min — ABNORMAL LOW (ref >60)
Glucose, Bld: 117 mg/dL — ABNORMAL HIGH (ref 70–99)
Potassium: 4.1 mmol/L (ref 3.5–5.1)
Sodium: 138 mmol/L (ref 135–145)

## 2019-03-12 LAB — CBC
HCT: 34.1 % — ABNORMAL LOW (ref 39.0–52.0)
Hemoglobin: 11.4 g/dL — ABNORMAL LOW (ref 13.0–17.0)
MCH: 31.6 pg (ref 26.0–34.0)
MCHC: 33.4 g/dL (ref 30.0–36.0)
MCV: 94.5 fL (ref 80.0–100.0)
Platelets: 221 10*3/uL (ref 150–400)
RBC: 3.61 MIL/uL — ABNORMAL LOW (ref 4.22–5.81)
RDW: 14 % (ref 11.5–15.5)
WBC: 10 10*3/uL (ref 4.0–10.5)
nRBC: 0 % (ref 0.0–0.2)

## 2019-03-12 LAB — SURGICAL PATHOLOGY

## 2019-03-12 MED ORDER — FERROUS GLUCONATE 324 (38 FE) MG PO TABS
324.0000 mg | ORAL_TABLET | Freq: Every day | ORAL | Status: DC
  Administered 2019-03-12 – 2019-03-15 (×4): 324 mg via ORAL
  Filled 2019-03-12 (×5): qty 1

## 2019-03-12 MED ORDER — CLOPIDOGREL BISULFATE 75 MG PO TABS
75.0000 mg | ORAL_TABLET | Freq: Every day | ORAL | Status: DC
  Administered 2019-03-12 – 2019-03-15 (×4): 75 mg via ORAL
  Filled 2019-03-12 (×4): qty 1

## 2019-03-12 MED ORDER — SODIUM CHLORIDE 0.9 % IV SOLN
INTRAVENOUS | Status: DC
  Administered 2019-03-12 – 2019-03-14 (×4): via INTRAVENOUS

## 2019-03-12 MED ORDER — CLOPIDOGREL 5 MG/ML PEDIATRIC ORAL SUSPENSION: 75.0000 mg | Freq: Every day | ORAL | Status: DC

## 2019-03-12 NOTE — Addendum Note (Signed)
Addendum  created 03/12/19 1207 by Sammie Bench, CRNA   Intraprocedure Staff edited

## 2019-03-12 NOTE — Progress Notes (Signed)
    [facility] has been requested to perform a transesophageal echocardiogram on Zachary [name] for bacteremia.  After careful review of history and examination, the risks and benefits of transesophageal echocardiogram have been explained including risks of esophageal damage, perforation (1:10,000 risk), bleeding, pharyngeal hematoma as well as other potential complications associated with conscious sedation including aspiration, arrhythmia, respiratory failure and death. Alternatives to treatment were discussed, questions were answered. Patient is willing to proceed.   Lyda Jester, PA-C  03/12/2019 5:01 PM

## 2019-03-12 NOTE — Progress Notes (Signed)
Internal Medicine Attending:   I saw and examined the patient. I reviewed the resident's note and I agree with the resident's findings and plan as documented in the resident's note.  Patient states that he feels well today except that he had one episode of shakes last night and is unsure what this is.  Patient is initially admitted to hospital with worsening fatigue and dysphagia and was found to have strep bovis bacteremia.  Patient status post colonoscopy with no evidence of malignancy.  He is also status post an EGD which did not show an esophageal stricture.  He is awaiting a TEE (likely tomorrow morning) to rule out endocarditis.  Repeat blood cultures have been negative.  We will continue ceftriaxone to complete his course of antibiotics.  No further work-up at this time.  Patient also noted to have mildly worsening creatinine (up to 1.99 from baseline creatinine of approximately 1.6).  Will hold Lasix and lisinopril for now.  We will start the patient on maintenance IV fluids as he needs to be n.p.o. after midnight for TEE tomorrow.  We will recheck BMP in the morning.  No further work-up at this time.  We will continue to monitor closely.  I suspect the patient should be stable for DC to ALF/SNF tomorrow.

## 2019-03-12 NOTE — Plan of Care (Signed)

## 2019-03-12 NOTE — Progress Notes (Signed)
    Subjective: Patient seen at the bedside this morning on rounds. Patient says that he still had an episode of the shakes last night not sure what that is. Patient says he is some lower extremity pain but will put Voltaren gel on it later. We discussed he is scheduled to get TEE done tomorrow.  Patient says he understands.   Objective:  Vital signs in last 24 hours: Vitals:   03/11/19 1512 03/11/19 2213 03/12/19 0452 03/12/19 0455  BP: 140/68 (!) 110/54 (!) 145/62   Pulse:  72 68   Resp:  16 20   Temp:  98.2 F (36.8 C) 98.2 F (36.8 C)   TempSrc:  Oral Oral   SpO2:  91% 96%   Weight:    95.1 kg  Height:       Physical exam: General: Sitting up at the bedside chair watching TV HEENT: NCAT CV: Regular rate and rhythm. S1 and S2 normal no murmurs appreciated. No lower extremity edema. PULM: Clear to auscultation bilaterally ABD: Bowel sounds present, no tenderness to palpation NEURO: Alert and oriented x4, no focal deficits Derm: Bilateral lower extremity dermatitis  Assessment/Plan:  Principal Problem:   Streptococcal bacteremia Active Problems:   Essential hypertension   Dizziness   Pneumonia   AKI (acute kidney injury) ([place])  In summary Mr. Graeff is a 85 year old male with a history of HTN, BPH, CKD stage III, CVA in 2019 and chronic pain who presented from an ALF with a 1 to 2-week history of fatigue and dysphasia with solid foods. Pt was treated for aspiration pneumonia and course was complicated by Strep bovis + cultures, raising concerns for endocarditis and malignancy.  Patient received EGD and colonoscopy today and did not show evidence of malignancy. Still needs TEE to rule out endocarditis which is scheduled for tomorrow.  #Aspiration pneumonia #Strep bovis bacteremia: Pneumonia resolved. EGD and colonoscopy did not show evidence of malignancy. Biopsies of gastric mucosa were taken and are pending. TTE was obtained and unremarkable, needs TEE, cardiology on  board. - TEE scheduled for tomorrow at 8:30 AM - Continue ceftriaxone - Follow-up gastric biopsies  #Anemia - Ferrous gluconate 324 mg started today  #AKI: BUN and CR increased to 37 and 1.99 respectively.  - Discontinue Lasix 40 mg daily - Discontinue lisinopril 20 mg daily - Start IVF NS 50 cc/hr  #Hx CVA in 2019 #HTN: Pt has had elevated pressures intermittently, 145/62 this AM. - Amlodipine 10 mg daily - Lisinopril 20 mg daily - Labetalol 100 mg 3 times daily PRN - aspirin 81 mg, restarted Plavix 75 mg today.  - atorvastatin 40 mg daily   #BPH - Flomax 0.4 mg daily - Finasteride 5 mg daily   #Chronic pain: On chronic pain medications at baseline - Methadone 5 mg TID - Tramadol 50 mg once daily PRN   #FEN/GI - Diet:2 g sodium, NPO at midnight in preparation for TEE - IVF: NS 50 cc/hr as noted above   #VTE ppx: SQ Lovenox    #Code status: Full   #Dispo: Pending medical course.   [name], MD Internal Medicine, PGY1 Pager: [phone]  03/12/2019,3:11 PM

## 2019-03-12 NOTE — Progress Notes (Signed)
Occupational Therapy Treatment Patient Details Name: Zachary [name] MRN: [id number] DOB: [birth year] Today's Date: 03/12/2019    History of present illness Zachary [name] is a 85 y.o. male with a pmh significant for CAD, arthritis, cataracts, chronic renal insufficiency, diverticulosis, GERD, obesity, CHF.  GI asked to evaluate patient in setting of strep bovis infection and with esophageal dysphagia.   OT comments  Pt making steady progress towards OT goals this session. Pt complete functional mobility from recliner>couch; pt requesting to look outside. Pt MIN guard for sit>stand/ functional mobility with RW for safety. Instructed pt  on BUE HEP with level 1 theraband seated on couch. Pt return demonstrate all there ex with good carryover; visual/ tactile cues for body mechanics and hand placement. DC plan remains appropriate. Will continue to follow for acute OT needs.   Follow Up Recommendations  Home health OT;Supervision/Assistance - 24 hour;Other (comment)(at ALF)    Equipment Recommendations  None recommended by OT    Recommendations for Other Services      Precautions / Restrictions Precautions Precautions: Fall Restrictions Weight Bearing Restrictions: No       Mobility Bed Mobility               General bed mobility comments: up in recliner upon OT arrival  Transfers Overall transfer level: Needs assistance Equipment used: Rolling walker (2 wheeled) Transfers: Sit to/from Stand Sit to Stand: Min guard;From elevated surface         General transfer comment: sit >stand from elevated recliner MIN guard for safety; cues for hand placement; pt complete functional mobility in room requesting to sit near window; MINguard for safety during funtional mobility    Balance Overall balance assessment: Needs assistance;History of Falls Sitting-balance support: Feet supported Sitting balance-Zachary [name] Scale: Fair Sitting balance - Comments: able to static sit with no external  support   Standing balance support: Bilateral upper extremity supported Standing balance-Zachary [name] Scale: Poor Standing balance comment: requires UE support - pt standing with RW only                           ADL either performed or assessed with clinical judgement   ADL Overall ADL's : Needs assistance/impaired                         Toilet Transfer: Min Marine scientist Details (indicate cue type and reason): simulated toilet transfer from EOB>reclinerl MIN guard for safety with RW during ambulation         Functional mobility during ADLs: Min guard General ADL Comments: Pt decline further ADLs; session focus on functional mobility and BUE HEP as precursor to further ADL participation     Vision Patient Visual Report: No change from baseline     Perception     Praxis      Cognition Arousal/Alertness: Awake/alert Behavior During Therapy: WFL for tasks assessed/performed Overall Cognitive Status: Within Functional Limits for tasks assessed                                 General Comments: pt very pleasant and appreciative of therapy        Exercises General Exercises - Upper Extremity Shoulder Flexion: AROM;Both;10 reps;Theraband;Seated Theraband Level (Shoulder Flexion): Level 1 (Yellow) Shoulder Extension: AROM;Both;10 reps;Seated;Theraband Theraband Level (Shoulder Extension): Level 1 (Yellow) Elbow Flexion: AROM;Both;10 reps;Seated;Theraband Theraband Level (Elbow Flexion): Level 1 (  Yellow) Elbow Extension: AROM;Both;10 reps;Seated;Theraband Theraband Level (Elbow Extension): Level 1 (Yellow) Shoulder Exercises Shoulder Flexion: AROM;Both;10 reps;Seated;Theraband Theraband Level (Shoulder Flexion): Level 1 (Yellow) Shoulder Extension: AROM;Both;10 reps;Seated;Theraband Theraband Level (Shoulder Extension): Level 1 (Yellow)   Shoulder Instructions       General Comments      Pertinent Vitals/ Pain        Pain Assessment: No/denies pain  Home Living                                          Prior Functioning/Environment              Frequency  Min 2X/week        Progress Toward Goals  OT Goals(current goals can now be found in the care plan section)  Progress towards OT goals: Progressing toward goals  Acute Rehab OT Goals Patient Stated Goal: get something to eat OT Goal Formulation: With patient Time For Goal Achievement: 03/16/19 Potential to Achieve Goals: Good  Plan Discharge plan remains appropriate    Co-evaluation                 AM-PAC OT "6 Clicks" Daily Activity     Outcome Measure   Help from another person eating meals?: None Help from another person taking care of personal grooming?: A Little Help from another person toileting, which includes using toliet, bedpan, or urinal?: A Little Help from another person bathing (including washing, rinsing, drying)?: A Little Help from another person to put on and taking off regular upper body clothing?: None Help from another person to put on and taking off regular lower body clothing?: A Lot 6 Click Score: 19    End of Session Equipment Utilized During Treatment: Gait belt;Rolling walker  OT Visit Diagnosis: Muscle weakness (generalized) (M62.81);Unsteadiness on feet (R26.81)   Activity Tolerance Patient tolerated treatment well   Patient Left in bed;with call bell/phone within reach   Nurse Communication Mobility status;Other (comment)(pt requesting to sit on couch by window)        Time: 5427-0623 OT Time Calculation (min): 19 min  Charges: OT General Charges $OT Visit: 1 Visit OT Treatments $Therapeutic Exercise: 8-22 mins  [place], COTA/L Acute Rehabilitation Services [phone] [place] 03/12/2019, 1:06 PM

## 2019-03-12 NOTE — TOC Progression Note (Signed)
Transition of Care [facility]) - Progression Note    Patient Details  Name: Zachary [name] MRN: [id number] Date of Birth: 1930/05/02  Transition of Care [facility]) CM/SW [place], [place] Phone Number: 03/12/2019, 8:46 AM  Clinical Narrative:    CSW following for stability; pt from ALF not SNF. If he requires extended iv abx then he will need to consider SNF. Should he be able to return to ALF then we will need FL2 addendum completed along with an updated dc summary and scripts (pt MUST go with printed and signed prescriptions for any and all controlled substances regardless of if they were home meds).   Expected Discharge Plan: Assisted Living(w home health) Barriers to Discharge: Continued Medical Work up  Expected Discharge Plan and Services Expected Discharge Plan: Assisted Living(w home health) In-house Referral: Clinical Social Work Discharge Planning Services: CM Consult Post Acute Care Choice: Home Health, Resumption of Svcs/PTA Provider Living arrangements for the past 2 months: [place] Expected Discharge Date: 03/04/19               HH Arranged: PT, OT HH Agency: [place]   Social Determinants of Health (SDOH) Interventions    Readmission Risk Interventions No flowsheet data found.

## 2019-03-13 ENCOUNTER — Encounter (HOSPITAL_COMMUNITY)
Admission: EM | Disposition: A | Discharge status: Home / Self Care | Payer: Self-pay | Source: Home / Self Care | Attending: Internal Medicine

## 2019-03-13 ENCOUNTER — Inpatient Hospital Stay (HOSPITAL_COMMUNITY): Payer: Medicare Other | Admitting: Certified Registered Nurse Anesthetist

## 2019-03-13 ENCOUNTER — Inpatient Hospital Stay: Payer: Self-pay

## 2019-03-13 ENCOUNTER — Inpatient Hospital Stay (HOSPITAL_COMMUNITY): Payer: Medicare Other

## 2019-03-13 ENCOUNTER — Encounter (HOSPITAL_COMMUNITY): Payer: Self-pay

## 2019-03-13 DIAGNOSIS — I340 Nonrheumatic mitral (valve) insufficiency: Secondary | ICD-10-CM

## 2019-03-13 DIAGNOSIS — R7881 Bacteremia: Secondary | ICD-10-CM

## 2019-03-13 HISTORY — PX: TEE WITHOUT CARDIOVERSION: SHX5443

## 2019-03-13 LAB — CBC
HCT: 33.3 % — ABNORMAL LOW (ref 39.0–52.0)
Hemoglobin: 10.7 g/dL — ABNORMAL LOW (ref 13.0–17.0)
MCH: 30.8 pg (ref 26.0–34.0)
MCHC: 32.1 g/dL (ref 30.0–36.0)
MCV: 96 fL (ref 80.0–100.0)
Platelets: 236 10*3/uL (ref 150–400)
RBC: 3.47 MIL/uL — ABNORMAL LOW (ref 4.22–5.81)
RDW: 14 % (ref 11.5–15.5)
WBC: 8.5 10*3/uL (ref 4.0–10.5)
nRBC: 0 % (ref 0.0–0.2)

## 2019-03-13 LAB — BASIC METABOLIC PANEL
Anion gap: 8 (ref 5–15)
BUN: 39 mg/dL — ABNORMAL HIGH (ref 8–23)
CO2: 20 mmol/L — ABNORMAL LOW (ref 22–32)
Calcium: 8 mg/dL — ABNORMAL LOW (ref 8.9–10.3)
Chloride: 110 mmol/L (ref 98–111)
Creatinine, Ser: 1.85 mg/dL — ABNORMAL HIGH (ref 0.61–1.24)
GFR calc Af Amer: 37 mL/min — ABNORMAL LOW (ref >60)
GFR calc non Af Amer: 32 mL/min — ABNORMAL LOW (ref >60)
Glucose, Bld: 107 mg/dL — ABNORMAL HIGH (ref 70–99)
Potassium: 4 mmol/L (ref 3.5–5.1)
Sodium: 138 mmol/L (ref 135–145)

## 2019-03-13 LAB — NOVEL CORONAVIRUS, NAA (HOSP ORDER, SEND-OUT TO REF LAB; TAT 18-24 HRS): SARS-CoV-2, NAA: NOT DETECTED

## 2019-03-13 SURGERY — ECHOCARDIOGRAM, TRANSESOPHAGEAL
Anesthesia: Monitor Anesthesia Care

## 2019-03-13 MED ORDER — PROPOFOL 10 MG/ML IV BOLUS
INTRAVENOUS | Status: DC | PRN
  Administered 2019-03-13: 15 mg via INTRAVENOUS
  Administered 2019-03-13 (×2): 10 mg via INTRAVENOUS

## 2019-03-13 MED ORDER — SODIUM CHLORIDE 0.9 % IV SOLN: INTRAVENOUS | Status: DC

## 2019-03-13 MED ORDER — BUTAMBEN-TETRACAINE-BENZOCAINE 2-2-14 % EX AERO
INHALATION_SPRAY | CUTANEOUS | Status: DC | PRN
  Administered 2019-03-13: 09:00:00 2 via TOPICAL

## 2019-03-13 MED ORDER — PROPOFOL 500 MG/50ML IV EMUL
INTRAVENOUS | Status: DC | PRN
  Administered 2019-03-13: 80 ug/kg/min via INTRAVENOUS

## 2019-03-13 MED ORDER — ONDANSETRON HCL 4 MG/2ML IJ SOLN
INTRAMUSCULAR | Status: DC | PRN
  Administered 2019-03-13: 4 mg via INTRAVENOUS

## 2019-03-13 NOTE — Progress Notes (Signed)
Occupational Therapy Treatment Patient Details Name: Zachary [name] MRN: [id number] DOB: Sep 11, 1929 Today's Date: 03/13/2019    History of present illness Mr. Armendariz is a 85 y.o. male with a pmh significant for CAD, arthritis, cataracts, chronic renal insufficiency, diverticulosis, GERD, obesity, CHF.  GI asked to evaluate patient in setting of strep bovis infection and with esophageal dysphagia.   OT comments  Pt making steady progress towards OT goals this session. Session focus on functional mobility as precursor to higher level ADLs. Pt MOD I for bed mobility with use of bed features. MIN guard for sit>stand from elevated surface with RW. Stand pivot transfer from EOB>recliner with RW MIN guard for safety. Pt declined further ADLs as pt perseverating on eating breakfast and drinking coffee. DC plan remains appropriate. Will continue to follow for acute OT needs.    Follow Up Recommendations  Home health OT;Supervision/Assistance - 24 hour;Other (comment)    Equipment Recommendations  None recommended by OT    Recommendations for Other Services      Precautions / Restrictions Precautions Precautions: Fall Restrictions Weight Bearing Restrictions: No       Mobility Bed Mobility Overal bed mobility: Modified Independent             General bed mobility comments: no physical assist given; elevated HOB with use of bed features  Transfers Overall transfer level: Needs assistance Equipment used: Rolling walker (2 wheeled) Transfers: Sit to/from [employer] Transfers Sit to Stand: From elevated surface;Min guard Stand pivot transfers: Min guard       General transfer comment: sit >stand from elevated EOB with MIN Guard for safety; cues for hand placement; pt min guard for stand pivot transfer with RW; good hand placement when reaching back for recliner    Balance Overall balance assessment: Needs assistance;History of Falls Sitting-balance support: Feet  supported Sitting balance-Leahy Scale: Fair Sitting balance - Comments: able to static sit with no external support   Standing balance support: Bilateral upper extremity supported Standing balance-Leahy Scale: Poor Standing balance comment: requires UE support - pt standing with RW only                           ADL either performed or assessed with clinical judgement   ADL Overall ADL's : Needs assistance/impaired Eating/Feeding: Set up;Supervision/ safety;Sitting                       Toilet Transfer: Min guard;Ambulation;RW Toilet Transfer Details (indicate cue type and reason): simulated toilet transfer from EOB>reclinerl MIN guard for safety with RW during ambulation         Functional mobility during ADLs: Min guard;Rolling walker General ADL Comments: Pt MIN guard for functional mobility declining further ADLs     Vision Patient Visual Report: No change from baseline     Perception     Praxis      Cognition Arousal/Alertness: Awake/alert Behavior During Therapy: WFL for tasks assessed/performed Overall Cognitive Status: Within Functional Limits for tasks assessed                                 General Comments: pt perseverating on fact that he hasn't gotten breakfast yet or his coffee; brought pt coffee and checked on breakfast tray; pt much more pleasant after transferring pt to chair and issuing coffee        Exercises  Shoulder Instructions       General Comments      Pertinent Vitals/ Pain       Pain Assessment: No/denies pain  Home Living                                          Prior Functioning/Environment              Frequency  Min 2X/week        Progress Toward Goals  OT Goals(current goals can now be found in the care plan section)  Progress towards OT goals: Progressing toward goals  Acute Rehab OT Goals Patient Stated Goal: get something to eat OT Goal Formulation:  With patient Time For Goal Achievement: 03/16/19 Potential to Achieve Goals: Good  Plan Discharge plan remains appropriate    Co-evaluation                 AM-PAC OT "6 Clicks" Daily Activity     Outcome Measure   Help from another person eating meals?: None Help from another person taking care of personal grooming?: A Little Help from another person toileting, which includes using toliet, bedpan, or urinal?: A Little Help from another person bathing (including washing, rinsing, drying)?: A Little Help from another person to put on and taking off regular upper body clothing?: None Help from another person to put on and taking off regular lower body clothing?: A Lot 6 Click Score: 19    End of Session Equipment Utilized During Treatment: Gait belt;Rolling walker  OT Visit Diagnosis: Muscle weakness (generalized) (M62.81);Unsteadiness on feet (R26.81)   Activity Tolerance Patient tolerated treatment well   Patient Left in chair;with call bell/phone within reach   Nurse Communication Mobility status(assist level needed to transfer back to bed)        Time: 1121-1140 OT Time Calculation (min): 19 min  Charges: OT General Charges $OT Visit: 1 Visit OT Treatments $Therapeutic Activity: 8-22 mins  [place], [place] 03/13/2019, 12:03 PM

## 2019-03-13 NOTE — Transfer of Care (Signed)
Immediate Anesthesia Transfer of Care Note  Patient: Zachary [name]  Procedure(s) Performed: TRANSESOPHAGEAL ECHOCARDIOGRAM (TEE) (N/A )  Patient Location: PACU  Anesthesia Type:MAC  Level of Consciousness: awake, alert  and patient cooperative  Airway & Oxygen Therapy: Patient Spontanous Breathing and Patient connected to nasal cannula oxygen  Post-op Assessment: Report given to RN and Post -op Vital signs reviewed and stable  Post vital signs: Reviewed and stable  Last Vitals:  Vitals Value Taken Time  BP    Temp    Pulse    Resp    SpO2      Last Pain:  Vitals:   03/13/19 0841  TempSrc: Oral  PainSc: 2       Patients Stated Pain Goal: 2 (01/75/10 2585)  Complications: No apparent anesthesia complications

## 2019-03-13 NOTE — Progress Notes (Signed)
  Echocardiogram Echocardiogram Transesophageal has been performed.  Zachary [name] 03/13/2019, 10:04 AM

## 2019-03-13 NOTE — Progress Notes (Signed)
Subjective: Patient seen at the bedside this afternoon.  Patient says the TEE went well. We discussed the results with the patient, noting that there was an echogenic finding on his pulmonic valve they cannot rule out endocarditis. Therefore we recommend 6 weeks of antibiotics. Patient is in agreement and understands.  He also understands he needs a PICC line placed in will likely be discharged to a SNF. Patient does say that he had an episode of the shakes this morning.  No other complaints at this time.  Objective: TEE: IMPRESSIONS 1. Left ventricular ejection fraction, by visual estimation, is 60 to 65%. The left ventricle has normal function. Normal left ventricular size. There is no left ventricular hypertrophy. 2. Global right ventricle has normal systolic function.The right ventricular size is normal. No increase in right ventricular wall thickness. 3. Mild pulmonic regurgitation; there is both a central jet and an eccentric anterior-directed jet. There is a small, mobile echogenic structure on the pulmonary artery side of the valve. Cannot exclude vegetation. 4. The mitral valve is normal in structure. Mild mitral valve regurgitation. 5. The tricuspid valve is normal in structure. Tricuspid valve regurgitation was not visualized by color flow Doppler. 6. The aortic valve is tricuspid Aortic valve regurgitation was not visualized by color flow Doppler.  Vital signs in last 24 hours: Vitals:   03/13/19 0955 03/13/19 1003 03/13/19 1253 03/13/19 1413  BP: (!) 168/68 (!) 179/64 (!) 150/61 (!) 163/86  Pulse: 73  79 81  Resp: 14 18 18 18   Temp:   98.6 F (37 C) 98.3 F (36.8 C)  TempSrc:   Oral Oral  SpO2: 100% 97% 97% 97%  Weight:      Height:       Physical exam: General: Sitting up at the bedside chair watching TV HEENT: NCAT CV: Regular rate and rhythm. S1 and S2 normal no murmurs appreciated. No lower extremity edema. PULM: Clear to auscultation bilaterally ABD: Bowel sounds  present, no tenderness to palpation NEURO: Alert and oriented x4, no focal deficits Derm: Bilateral lower extremity dermatitis  Assessment/Plan:  Principal Problem:   Streptococcal bacteremia Active Problems:   Essential hypertension   Dizziness   Pneumonia   AKI (acute kidney injury) ([place])  In summary [name] is a 85 year old male with a history of HTN, BPH, CKD stage III, CVA in 2019 and chronic pain who presented from an ALF with a 1 to 2-week history of fatigue and dysphasia with solid foods. Pt was treated for aspiration pneumonia and course was complicated by Strep bovis + cultures, raising concerns for endocarditis and malignancy.  Patient received EGD and colonoscopy today and did not show evidence of malignancy. TEE showed echogenic structure on the pulmonary artery side of the valve that cannot exclude vegetation. In light of this finding, the patient will need 6 weeks of IV abx.  #Aspiration pneumonia #Strep bovis bacteremia #:Pulmonic valve vegetation?: Pneumonia resolved. EGD and colonoscopy did not show evidence of malignancy. Biopsies of gastric mucosa were taken and are pending. TEE showed pulmonic valve echogenic structure that could not rule out vegetation. - PICC line placement ordered - Continue ceftriaxone for a total of 6 weeks (stop date 10/29), will need SNF placement - Follow-up gastric biopsies  #Anemia - Continue ferrous gluconate 324 mg   #AKI: Creatinine decreased to 1.85 from 1.99 yesterday - Discontinued Lasix 40 mg daily - Discontinued lisinopril 20 mg daily - IVF NS 50 cc/hr  #Hx CVA in 2019 #HTN: Pt has  had elevated pressures intermittently, 145/62 this AM. - Amlodipine 10 mg daily - Lisinopril 20 mg daily - Labetalol 100 mg 3 times daily PRN - aspirin 81 mg, Plavix 75 mg - atorvastatin 40 mg daily   #BPH - Flomax 0.4 mg daily - Finasteride 5 mg daily   #Chronic pain: On chronic pain medications at baseline - Methadone 5 mg TID -  Tramadol 50 mg once daily PRN   #FEN/GI - Diet:2 g sodium, NPO at midnight in preparation for TEE - IVF: NS 50 cc/hr as noted above   #VTE ppx: SQ Lovenox    #Code status: Full   #Dispo: Pending medical course.   [name], MD Internal Medicine, PGY1 Pager: [phone]  03/13/2019,4:22 PM

## 2019-03-13 NOTE — CV Procedure (Signed)
    TRANSESOPHAGEAL ECHOCARDIOGRAM   NAME:  Zachary [name]   MRN: [id number] DOB:  [birth year]   ADMIT DATE: 03/01/2019  INDICATIONS: Bacteremia  PROCEDURE:   Informed consent was obtained prior to the procedure. The risks, benefits and alternatives for the procedure were discussed and the patient comprehended these risks.  Risks include, but are not limited to, cough, sore throat, vomiting, nausea, somnolence, esophageal and stomach trauma or perforation, bleeding, low blood pressure, aspiration, pneumonia, infection, trauma to the teeth and death.    Procedural time out performed. The oropharynx was anesthetized with topical 1% benzocaine.    Anesthesia was administered by Dr Royce Macadamia and Leim Fabry, CRNA. The patient's heart rate, blood pressure, and oxygen saturation are monitored continuously during the procedure.   The transesophageal probe was inserted in the esophagus and stomach without difficulty and multiple views were obtained.    COMPLICATIONS:    There were no immediate complications.  FINDINGS:  LEFT VENTRICLE: EF = 60-65%.  No regional wall motion abnormalities.  RIGHT VENTRICLE: Normal size and function.   LEFT ATRIUM: No thrombus/mass.  RIGHT ATRIUM: No thrombus/mass.  AORTIC VALVE:  Trileaflet. No regurgitation. No vegetation.  MITRAL VALVE:    Normal structure. Mild regurgitation. No vegetation.  TRICUSPID VALVE: Normal structure. No regurgitation. No vegetation.  PULMONIC VALVE: Grossly normal structure. Mild regurgitation; there is both a central jet and an eccentric anterior-directed jet.  There is a small, mobile echogenic structure on the pulmonary artery side of the valve.  Cannot exclude vegetation.  INTERATRIAL SEPTUM: No PFO or ASD seen by color Doppler.  PERICARDIUM: No effusion noted.  DESCENDING AORTA: Moderate plaque   CONCLUSION: Cannot exclude pulmonic valve endocarditis given  small, mobile echogenic structure on the pulmonary artery  side of the valve, with mild eccentric regurgitation.  There is no evidence of endocarditis in the other valves.  Oswaldo Milian MD [facility]  [address] [place], [place] [address] [phone]   1:28 PM

## 2019-03-13 NOTE — Anesthesia Procedure Notes (Signed)
Procedure Name: MAC Date/Time: 03/13/2019 9:20 AM Performed by: Elayne Snare, CRNA Pre-anesthesia Checklist: Patient identified, Emergency Drugs available, Suction available and Patient being monitored Patient Re-evaluated:Patient Re-evaluated prior to induction Oxygen Delivery Method: Nasal cannula

## 2019-03-13 NOTE — Interval H&P Note (Signed)
History and Physical Interval Note:  03/13/2019 9:18 AM  Zachary [name]  has presented today for surgery, with the diagnosis of BACTEREMIA.  The various methods of treatment have been discussed with the patient and family. After consideration of risks, benefits and other options for treatment, the patient has consented to  Procedure(s): TRANSESOPHAGEAL ECHOCARDIOGRAM (TEE) (N/A) as a surgical intervention.  The patient's history has been reviewed, patient examined, no change in status, stable for surgery.  I have reviewed the patient's chart and labs.  Questions were answered to the patient's satisfaction.     Donato Heinz

## 2019-03-13 NOTE — Progress Notes (Signed)
Patient returned to unit from procedure.

## 2019-03-13 NOTE — Progress Notes (Signed)
Pt consent for TEE signed.

## 2019-03-13 NOTE — TOC Progression Note (Signed)
Transition of Care [facility]) - Progression Note    Patient Details  Name: Zachary [name] MRN: [id number] Date of Birth: 05/19/1930  Transition of Care [facility]) CM/SW [place], [place] Phone Number: 03/13/2019, 11:16 AM  Clinical Narrative:    Following for stability/abx plans.    Expected Discharge Plan: Assisted Living(w home health) Barriers to Discharge: Continued Medical Work up  Expected Discharge Plan and Services Expected Discharge Plan: Assisted Living(w home health) In-house Referral: Clinical Social Work Discharge Planning Services: CM Consult Post Acute Care Choice: Home Health, Resumption of Svcs/PTA Provider Living arrangements for the past 2 months: [place] Expected Discharge Date: 03/04/19               HH Arranged: PT, OT HH Agency: [facility]   Social Determinants of Health (SDOH) Interventions    Readmission Risk Interventions No flowsheet data found.

## 2019-03-13 NOTE — Anesthesia Postprocedure Evaluation (Signed)
Anesthesia Post Note  Patient: Zachary [name]  Procedure(s) Performed: TRANSESOPHAGEAL ECHOCARDIOGRAM (TEE) (N/A )     Patient location during evaluation: PACU Anesthesia Type: MAC Level of consciousness: awake and alert and oriented Pain management: pain level controlled Vital Signs Assessment: post-procedure vital signs reviewed and stable Respiratory status: spontaneous breathing, nonlabored ventilation, respiratory function stable and patient connected to nasal cannula oxygen Cardiovascular status: stable and blood pressure returned to baseline Postop Assessment: no apparent nausea or vomiting Anesthetic complications: no    Last Vitals:  Vitals:   03/13/19 0946 03/13/19 0955  BP: (!) 132/53 (!) 168/68  Pulse: 72 73  Resp: 15 14  Temp: 36.6 C   SpO2: 100% 100%    Last Pain:  Vitals:   03/13/19 0955  TempSrc:   PainSc: 0-No pain                 Chanay Nugent A.

## 2019-03-13 NOTE — Anesthesia Preprocedure Evaluation (Signed)
Anesthesia Evaluation  Patient identified by MRN, date of birth, ID band Patient awake    Reviewed: Allergy & Precautions, NPO status , Patient's Chart, lab work & pertinent test results  Airway Mallampati: I  TM Distance: >3 FB Neck ROM: Full    Dental  (+) Edentulous Upper, Edentulous Lower   Pulmonary pneumonia, resolved, former smoker,    Pulmonary exam normal breath sounds clear to auscultation       Cardiovascular hypertension, Pt. on medications + Peripheral Vascular Disease  Normal cardiovascular exam Rhythm:Regular Rate:Normal     Neuro/Psych PSYCHIATRIC DISORDERS Anxiety Depression CVA    GI/Hepatic Neg liver ROS, GERD  Medicated and Controlled,  Endo/Other  Obesity HLD  Renal/GU Renal InsufficiencyRenal disease  negative genitourinary   Musculoskeletal  (+) Arthritis , Osteoarthritis,    Abdominal (+) + obese,   Peds  Hematology negative hematology ROS (+) anemia , Bacteremia- unknown origin Anticoagulated- lovenox Plavix last dose 02/28/2019   Anesthesia Other Findings   Reproductive/Obstetrics                             Anesthesia Physical Anesthesia Plan  ASA: III  Anesthesia Plan: MAC   Post-op Pain Management:    Induction: Intravenous  PONV Risk Score and Plan: 1 and Ondansetron  Airway Management Planned: Natural Airway and Nasal Cannula  Additional Equipment:   Intra-op Plan:   Post-operative Plan:   Informed Consent: I have reviewed the patients History and Physical, chart, labs and discussed the procedure including the risks, benefits and alternatives for the proposed anesthesia with the patient or authorized representative who has indicated his/her understanding and acceptance.       Plan Discussed with: CRNA and Surgeon  Anesthesia Plan Comments:         Anesthesia Quick Evaluation

## 2019-03-13 NOTE — Progress Notes (Signed)
Patient transported off unit for procedure.

## 2019-03-14 LAB — BASIC METABOLIC PANEL
Anion gap: 6 (ref 5–15)
BUN: 33 mg/dL — ABNORMAL HIGH (ref 8–23)
CO2: 23 mmol/L (ref 22–32)
Calcium: 8.1 mg/dL — ABNORMAL LOW (ref 8.9–10.3)
Chloride: 110 mmol/L (ref 98–111)
Creatinine, Ser: 1.61 mg/dL — ABNORMAL HIGH (ref 0.61–1.24)
GFR calc Af Amer: 44 mL/min — ABNORMAL LOW (ref >60)
GFR calc non Af Amer: 38 mL/min — ABNORMAL LOW (ref >60)
Glucose, Bld: 96 mg/dL (ref 70–99)
Potassium: 5.2 mmol/L — ABNORMAL HIGH (ref 3.5–5.1)
Sodium: 139 mmol/L (ref 135–145)

## 2019-03-14 LAB — CBC
HCT: 32.5 % — ABNORMAL LOW (ref 39.0–52.0)
Hemoglobin: 10.5 g/dL — ABNORMAL LOW (ref 13.0–17.0)
MCH: 31.1 pg (ref 26.0–34.0)
MCHC: 32.3 g/dL (ref 30.0–36.0)
MCV: 96.2 fL (ref 80.0–100.0)
Platelets: 244 10*3/uL (ref 150–400)
RBC: 3.38 MIL/uL — ABNORMAL LOW (ref 4.22–5.81)
RDW: 14.1 % (ref 11.5–15.5)
WBC: 7.4 10*3/uL (ref 4.0–10.5)
nRBC: 0 % (ref 0.0–0.2)

## 2019-03-14 NOTE — Progress Notes (Signed)
I spent roughly 15 minutes talking to Zachary [name] at the bedside this afternoon about the risks and benefits of having a PICC line for treatment of endocarditis. He understands that refusing the PICC line at this time could potentially mean he gets sick again with the bacterial infection. He also understands that this decision could lead to his death but he is has excepted that. He voices understanding why we are recommending IV antibiotics long-term, but he does not want to pursue this. He is open to taking oral antibiotics instead of doing the IV antibiotics once he is discharged. He wishes to go back to his ALF.  [name], MD Internal Medicine, PGY1 Pager: [phone]  03/14/2019,3:08 PM

## 2019-03-14 NOTE — Plan of Care (Signed)
  Problem: Health Behavior/Discharge Planning: Goal: Ability to manage health-related needs will improve Outcome: Progressing   

## 2019-03-14 NOTE — Progress Notes (Signed)
Physical Therapy Treatment Patient Details Name: Zachary [name] MRN: [id number] DOB: [birth year] Today's Date: 03/14/2019    History of Present Illness Mr. Egelston is a 85 y.o. male with a pmh significant for CAD, arthritis, cataracts, chronic renal insufficiency, diverticulosis, GERD, obesity, CHF.  GI asked to evaluate patient in setting of strep bovis infection and with esophageal dysphagia.    PT Comments    Pt performed gt training and functional mobility with min guard assistance. He is eager to return home and regain his independence.  Continue to recommend HHPT.  Plan for next session for therapeutic exercises and progression of mobility to tolerance.     Follow Up Recommendations  Home health PT     Equipment Recommendations  Rolling walker with 5" wheels    Recommendations for Other Services       Precautions / Restrictions Precautions Precautions: Fall Precaution Comments: SOB with minimal exertion Restrictions Weight Bearing Restrictions: No    Mobility  Bed Mobility               General bed mobility comments: Pt seated in recliner on arrival.  Transfers Overall transfer level: Needs assistance Equipment used: Rolling walker (2 wheeled) Transfers: Sit to/from Stand Sit to Stand: Min guard         General transfer comment: Cues for hand placement, increased time to rise to standing.  Ambulation/Gait Ambulation/Gait assistance: Supervision Gait Distance (Feet): 80 Feet Assistive device: Rolling walker (2 wheeled) Gait Pattern/deviations: Step-through pattern;Decreased step length - right;Decreased step length - left;Trunk flexed     General Gait Details: Pt reports he feeling fatigue with noticeable DOE.  He reports minor feeling of lightheadedness but reports fatigue is more.  No BP taken as he was able to do all functional activities without hindrance,   Stairs             Wheelchair Mobility    Modified Rankin (Stroke Patients  Only)       Balance Overall balance assessment: Needs assistance   Sitting balance-Leahy Scale: Fair       Standing balance-Leahy Scale: Poor                              Cognition Arousal/Alertness: Awake/alert Behavior During Therapy: WFL for tasks assessed/performed Overall Cognitive Status: Within Functional Limits for tasks assessed                                        Exercises      General Comments        Pertinent Vitals/Pain Pain Assessment: Faces Faces Pain Scale: Hurts even more Pain Location: bottom when sitting. Pain Descriptors / Indicators: Sore;Discomfort Pain Intervention(s): Monitored during session;Repositioned(ordered cushion for chair.)    Home Living                      Prior Function            PT Goals (current goals can now be found in the care plan section) Acute Rehab PT Goals Patient Stated Goal: to drink his coffee when he gets back Potential to Achieve Goals: Good Progress towards PT goals: Progressing toward goals    Frequency    Min 3X/week      PT Plan Current plan remains appropriate    Co-evaluation  AM-PAC PT "6 Clicks" Mobility   Outcome Measure  Help needed turning from your back to your side while in a flat bed without using bedrails?: None Help needed moving from lying on your back to sitting on the side of a flat bed without using bedrails?: A Little Help needed moving to and from a bed to a chair (including a wheelchair)?: A Little Help needed standing up from a chair using your arms (e.g., wheelchair or bedside chair)?: A Little Help needed to walk in hospital room?: A Little Help needed climbing 3-5 steps with a railing? : A Little 6 Click Score: 19    End of Session Equipment Utilized During Treatment: Gait belt Activity Tolerance: Patient tolerated treatment well Patient left: in chair;with call bell/phone within reach;with chair alarm  set Nurse Communication: Mobility status PT Visit Diagnosis: Unsteadiness on feet (R26.81);Repeated falls (R29.6);Muscle weakness (generalized) (M62.81)     Time: 0037-9444 PT Time Calculation (min) (ACUTE ONLY): 17 min  Charges:  $Gait Training: 8-22 mins                     Governor Rooks, PTA Acute Rehabilitation Services Pager [phone] Office [phone]     Timofey Carandang Eli Hose 03/14/2019, 5:17 PM

## 2019-03-14 NOTE — Progress Notes (Signed)
Patient refusing PICC line placement, educated patient on rationale for picc. Paged and notified MD of patient refusal. Will continue to monitor for remainder of shift.

## 2019-03-14 NOTE — Progress Notes (Signed)
Subjective: Patient seen at the bedside this AM.  The patient said he had 3 episodes of the shakes overnight and describes them as cramping-like sensations in his bilateral lower extremities.  We encouraged the patient to stretch.  We discussed how he will need a PICC line for 6 weeks of antibiotics and the patient was in agreement.  No other complaints at this time.  ADDENDUM: Patient has apparently changed his mind and does not want a PICC line placed, per nursing.  I will return to the room to discuss the risk and benefits of the PICC line to make sure that this is truly what he wants.  Objective:  Vital signs in last 24 hours: Vitals:   03/13/19 1253 03/13/19 1413 03/13/19 2130 03/14/19 0423  BP: (!) 150/61 (!) 163/86 (!) 165/64 (!) 163/68  Pulse: 79 81 81 69  Resp: 18 18 (!) 22 16  Temp: 98.6 F (37 C) 98.3 F (36.8 C) 98.2 F (36.8 C) 97.7 F (36.5 C)  TempSrc: Oral Oral Oral Oral  SpO2: 97% 97% 98% 97%  Weight:      Height:       Physical exam: General: Sitting up in the bedside chair comfortably, NAD HEENT: NCAT CV: Regular rate and rhythm. S1 and S2 normal no murmurs appreciated. No lower extremity edema. PULM: Clear to auscultation bilaterally ABD: Bowel sounds present, no tenderness to palpation NEURO: Alert and oriented x4, no focal deficits  Assessment/Plan:  Principal Problem:   Streptococcal bacteremia Active Problems:   Essential hypertension   Dizziness   Pneumonia   AKI (acute kidney injury) ([place])  In summary Zachary [name] is a 85 year old male with a history of HTN, BPH, CKD stage III, CVA in 2019 and chronic pain who presented from an ALF with a 1 to 2-week history of fatigue and dysphasia with solid foods. Pt was treated for aspiration pneumonia and course was complicated by Strep bovis + cultures, raising concerns for endocarditis and malignancy.  Patient received EGD and colonoscopy did not show evidence of malignancy. TEE did show echogenic structure  on the pulmonic valve that cannot exclude vegetation. In light of this finding, the patient will need 6 weeks of IV abx for treatment of presumed endocarditis.   #Aspiration pneumonia #Strep bovis bacteremia #:Pulmonic valve vegetation?: Pneumonia resolved. EGD and colonoscopy did not show evidence of malignancy. Biopsies of gastric mucosa were taken and are pending. TEE showed pulmonic valve echogenic structure that could not rule out vegetation. - Patient refused the PICC line today, will return to room to discuss risks and benefits to see if this is truly what he wants. The patient needs 6 weeks of antibiotics (stop date 10/29) and will need SNF placement. - Continue ceftriaxone  - Follow-up gastric biopsies  #Anemia - Continue ferrous gluconate 324 mg   #AKI: Creatinine decreased to 1.61 from 1.85 yesterday - Continue holding Lasix 40 mg daily - Continue holding lisinopril 20 mg daily - IVF NS 50 cc/hr  #Hx CVA in 2019 #HTN: Pt has had elevated pressures intermittently, 163/68 this AM. - Amlodipine 10 mg daily - Labetalol 100 mg 3 times daily PRN - aspirin 81 mg, Plavix 75 mg - atorvastatin 40 mg daily   #BPH - Flomax 0.4 mg daily - Finasteride 5 mg daily   #Chronic pain: On chronic pain medications at baseline - Methadone 5 mg TID - Tramadol 50 mg once daily PRN   #FEN/GI - Diet:2 g sodium, NPO at midnight in preparation  for TEE - IVF: NS 50 cc/hr as noted above   #VTE ppx: SQ Lovenox    #Code status: Full   #Dispo: Pending medical course.   Zachary Plater, MD Internal Medicine, PGY1 Pager: [phone]  03/14/2019,1:43 PM

## 2019-03-14 NOTE — NC FL2 (Signed)
[place] LEVEL OF CARE SCREENING TOOL     IDENTIFICATION  Patient Name: Zachary [name] Birthdate: [birth year] Sex: male Admission Date (Current Location): 03/01/2019  [place] and [place] Number:  [name] [id number] [place] and Address:  [facility] [place], [place] [address] [place], [place] [address]      Provider Number: [id number]  Attending Physician Name and Address:  Aldine Contes, MD  Relative Name and Phone Number:  Valli Glance; sister, [phone]    Current Level of Care: Hospital Recommended Level of Care: [place] Prior Approval Number:    Date Approved/Denied:   PASRR Number: [id number]  Discharge Plan: SNF    Current Diagnoses: Patient Active Problem List   Diagnosis Date Noted  . Streptococcal bacteremia 03/04/2019  . Pneumonia 03/01/2019  . AKI (acute kidney injury) ([place]) 03/01/2019  . Cerebral thrombosis with cerebral infarction 12/17/2017  . Hyperlipidemia   . Vertigo 12/16/2017  . Dry eyes 05/01/2017  . Senile purpura ([place]) 07/18/2016  . Aortic atherosclerosis ([place]) 02/26/2015  . Chronic kidney disease, stage 3 ([place]) 11/08/2013  . Allergic rhinitis 09/13/2013  . Osteoarthritis of left knee 03/07/2013  . Swelling of lower extremity 12/07/2012  . Gastritis 07/06/2012  . Obesity (BMI 30.0-34.9) 07/06/2012  . Squamous cell skin cancer 07/06/2012  . Healthcare maintenance 07/06/2012  . Constipation due to pain medication 06/03/2011  . Arthritis of hip 05/21/2010  . Dizziness 10/30/2006  . Anxiety 06/22/2006  . Major depressive disorder, recurrent severe without psychotic features ([place]) 06/22/2006  . Chronic pain disorder 06/22/2006  . Essential hypertension 06/22/2006  . Gastroesophageal reflux disease with stricture 06/22/2006  . Diverticulosis of colon 06/22/2006  . Benign prostatic hypertrophy with urinary obstruction 06/22/2006  . Facial basal cell cancer 06/22/2006    Orientation  RESPIRATION BLADDER Height & Weight     Self, Time, Situation, Place  Normal Continent Weight: 209 lb 11.2 oz (95.1 kg) Height:  '5\' 7"'$  (170.2 cm)  BEHAVIORAL SYMPTOMS/MOOD NEUROLOGICAL BOWEL NUTRITION STATUS      Continent Diet(mechanical soft; thin liquids)  AMBULATORY STATUS COMMUNICATION OF NEEDS Skin   Limited Assist Verbally Skin abrasions(abrasions on bilateral arms)                       Personal Care Assistance Level of Assistance  Bathing, Feeding, Dressing Bathing Assistance: Limited assistance Feeding assistance: Independent Dressing Assistance: Limited assistance     Functional Limitations Info  Hearing, Sight, Speech Sight Info: Adequate Hearing Info: Adequate Speech Info: Adequate    SPECIAL CARE FACTORS FREQUENCY  OT (By licensed OT), PT (By licensed PT)     PT Frequency: 3x week OT Frequency: 2x week            Contractures Contractures Info: Not present    Additional Factors Info  Code Status, Allergies, Psychotropic Code Status Info: Full Code Allergies Info: Clindamycin/lincomycin, Lincomycin Hcl Psychotropic Info: mirtazapine (REMERON) tablet 30 mg daily at bedtime PO; risperiDONE (RISPERDAL) tablet 0.25 mg daily PO; sertraline (ZOLOFT) tablet 150 mg 2x daily PO         Current Medications (03/14/2019):  This is the current hospital active medication list Current Facility-Administered Medications  Medication Dose Route Frequency Provider Last Rate Last Dose  . 0.9 %  sodium chloride infusion   Intravenous Continuous [name], MD 50 mL/hr at 03/14/19 0310    . acetaminophen (TYLENOL) tablet 650 mg  650 mg Oral Q6H PRN Doran Stabler, MD  650 mg at 03/09/19 1541   Or  . acetaminophen (TYLENOL) suppository 650 mg  650 mg Rectal Q6H PRN [name], MD      . albuterol (PROVENTIL) (2.5 MG/3ML) 0.083% nebulizer solution 2.5 mg  2.5 mg Nebulization Q6H PRN [name], MD      . amLODipine (NORVASC) tablet 10 mg  10  mg Oral Daily [name], MD   10 mg at 03/14/19 0953  . aspirin EC tablet 81 mg  81 mg Oral Daily [name], MD   81 mg at 03/14/19 0954  . atorvastatin (LIPITOR) tablet 40 mg  40 mg Oral q1800 [name], MD   40 mg at 03/13/19 1725  . cefTRIAXone (ROCEPHIN) 2 g in sodium chloride 0.9 % 100 mL IVPB  2 g Intravenous Q24H [name], MD 200 mL/hr at 03/14/19 1000 2 g at 03/14/19 1000  . clopidogrel (PLAVIX) tablet 75 mg  75 mg Oral Daily [name], MD   75 mg at 03/14/19 0953  . diclofenac sodium (VOLTAREN) 1 % transdermal gel 2 g  2 g Topical TID [name], MD   2 g at 03/14/19 0958  . feeding supplement (ENSURE ENLIVE) (ENSURE ENLIVE) liquid 237 mL  237 mL Oral TID BM [name], MD   237 mL at 03/14/19 0953  . ferrous gluconate (FERGON) tablet 324 mg  324 mg Oral Q breakfast Jeanmarie Hubert, MD   324 mg at 03/14/19 0753  . finasteride (PROSCAR) tablet 5 mg  5 mg Oral Daily [name], MD   5 mg at 03/14/19 0953  . fluticasone (FLONASE) 50 MCG/ACT nasal spray 2 spray  2 spray Each Nare Daily [name], MD   2 spray at 03/14/19 0955  . labetalol (NORMODYNE) tablet 100 mg  100 mg Oral TID PRN [name], MD   100 mg at 03/07/19 0604  . LORazepam (ATIVAN) tablet 0.5 mg  0.5 mg Oral Q8H PRN [name], MD   0.5 mg at 03/12/19 2104  . methadone (DOLOPHINE) tablet 5 mg  5 mg Oral Q8H [name], MD   5 mg at 03/14/19 0545  . mirtazapine (REMERON) tablet 30 mg  30 mg Oral QHS [name], MD   30 mg at 03/13/19 2135  . pantoprazole (PROTONIX) EC tablet 40 mg  40 mg Oral Daily [name], MD   40 mg at 03/14/19 0954  . peg 3350 powder (MOVIPREP) kit 100 g  0.5 kit Oral Once [name], MD      . polyethylene glycol (MIRALAX / GLYCOLAX) packet 68 g  68 g Oral Once [name], MD      . polyvinyl alcohol (LIQUIFILM TEARS) 1.4 % ophthalmic solution 2 drop  2 drop Both Eyes TID  [name], MD   2 drop at 03/14/19 0955  . risperiDONE (RISPERDAL) tablet 0.25 mg  0.25 mg Oral BID [name], MD   0.25 mg at 03/14/19 0953  . senna-docusate (Senokot-S) tablet 2 tablet  2 tablet Oral BID Doran Stabler, MD   2 tablet at 03/14/19 0954  . sertraline (ZOLOFT) tablet 150 mg  150 mg Oral Daily [name], MD   150 mg at 03/14/19 0954  . sodium chloride flush (NS) 0.9 % injection  3 mL  3 mL Intravenous Q12H [name], MD   3 mL at 03/14/19 0955  . tamsulosin (FLOMAX) capsule 0.4 mg  0.4 mg Oral Daily [name], MD   0.4 mg at 03/14/19 0954  . traMADol (ULTRAM) tablet 50 mg  50 mg Oral Daily PRN Doran Stabler, MD   50 mg at 03/12/19 2104     Discharge Medications: Please see discharge summary for a list of discharge medications.  Relevant Imaging Results:  Relevant Lab Results:   Additional Information SS# [id number]; COVID negative on 9/23; needs 6 weeks abx  Alexander Mt, LCSWA

## 2019-03-14 NOTE — TOC Progression Note (Signed)
Transition of Care [facility]) - Progression Note    Patient Details  Name: Zachary [name] MRN: [id number] Date of Birth: 03/13/1930  Transition of Care [facility]) CM/SW [place], [place] Phone Number: 03/14/2019, 12:36 PM  Clinical Narrative:    CSW spoke with pt a bedside; we discussed that he if needs abx that he would likely need to go to a SNF due to ALFs not usually being able to assist with abx. Pt wants to return to ALF but if he has to go to SNF he wants to go to [facility]. Pt MD team then rounded on pt and CSW let pt know I would f/u with [facility] and see if they have any beds.   Pt is on methadone which makes SNF placement tricky (almost no SNFs are able to manage methadone due to providers not being able to prescribe it). [facility] does not currently have beds but will review to see if they can accept.   CSW was notified that pt is currently refusing picc therefore will follow for plan of care updates. If he refused abx he may be able to dc back to [facility] where he is a resident.   Expected Discharge Plan: Assisted Living(w home health) Barriers to Discharge: Continued Medical Work up  Expected Discharge Plan and Services Expected Discharge Plan: Assisted Living(w home health) In-house Referral: Clinical Social Work Discharge Planning Services: CM Consult Post Acute Care Choice: Home Health, Resumption of Svcs/PTA Provider Living arrangements for the past 2 months: [place] Expected Discharge Date: 03/04/19               HH Arranged: PT, OT HH Agency: [facility]   Social Determinants of Health (SDOH) Interventions    Readmission Risk Interventions No flowsheet data found.

## 2019-03-14 NOTE — Progress Notes (Signed)
After spending approximately 15 mins listening to patient talk about his feelings about going to another facility due to PICC and abx, patient signed consent for PICC placement, however decided he was going to refuse at this time. Will continue to monitor.

## 2019-03-15 ENCOUNTER — Encounter: Payer: Self-pay | Admitting: Gastroenterology

## 2019-03-15 DIAGNOSIS — E875 Hyperkalemia: Secondary | ICD-10-CM

## 2019-03-15 LAB — BASIC METABOLIC PANEL
Anion gap: 11 (ref 5–15)
BUN: 27 mg/dL — ABNORMAL HIGH (ref 8–23)
CO2: 21 mmol/L — ABNORMAL LOW (ref 22–32)
Calcium: 8.4 mg/dL — ABNORMAL LOW (ref 8.9–10.3)
Chloride: 106 mmol/L (ref 98–111)
Creatinine, Ser: 1.44 mg/dL — ABNORMAL HIGH (ref 0.61–1.24)
GFR calc Af Amer: 50 mL/min — ABNORMAL LOW (ref >60)
GFR calc non Af Amer: 43 mL/min — ABNORMAL LOW (ref >60)
Glucose, Bld: 139 mg/dL — ABNORMAL HIGH (ref 70–99)
Potassium: 5.4 mmol/L — ABNORMAL HIGH (ref 3.5–5.1)
Sodium: 138 mmol/L (ref 135–145)

## 2019-03-15 LAB — CBC
HCT: 38.1 % — ABNORMAL LOW (ref 39.0–52.0)
Hemoglobin: 12.5 g/dL — ABNORMAL LOW (ref 13.0–17.0)
MCH: 31.1 pg (ref 26.0–34.0)
MCHC: 32.8 g/dL (ref 30.0–36.0)
MCV: 94.8 fL (ref 80.0–100.0)
Platelets: 280 10*3/uL (ref 150–400)
RBC: 4.02 MIL/uL — ABNORMAL LOW (ref 4.22–5.81)
RDW: 13.9 % (ref 11.5–15.5)
WBC: 6.6 10*3/uL (ref 4.0–10.5)
nRBC: 0 % (ref 0.0–0.2)

## 2019-03-15 MED ORDER — LORAZEPAM 0.5 MG PO TABS: 0.5000 mg | ORAL_TABLET | ORAL | 0 refills | Status: AC

## 2019-03-15 MED ORDER — AMLODIPINE BESYLATE 10 MG PO TABS: 10.0000 mg | ORAL_TABLET | Freq: Every day | ORAL | 5 refills | Status: DC

## 2019-03-15 MED ORDER — FERROUS GLUCONATE 324 (38 FE) MG PO TABS: 324.0000 mg | ORAL_TABLET | Freq: Every day | ORAL | 3 refills | Status: DC

## 2019-03-15 MED ORDER — AMOXICILLIN 500 MG PO CAPS: 1000.0000 mg | ORAL_CAPSULE | Freq: Three times a day (TID) | ORAL | 0 refills | Status: AC

## 2019-03-15 MED ORDER — METHADONE HCL 5 MG PO TABS: 5.0000 mg | ORAL_TABLET | Freq: Three times a day (TID) | ORAL | 0 refills | Status: AC

## 2019-03-15 MED FILL — AMLODIPINE BESYLATE 10 MG T: 10 | 60 days supply | Qty: 60 | Fill #0

## 2019-03-15 MED FILL — AMOXICILLIN 500 MG CAPS: 500 | 30 days supply | Qty: 180 | Fill #0

## 2019-03-15 MED FILL — FERROUS GLUCONATE 324 MG TA: 324 (38 FE) | 30 days supply | Qty: 30 | Fill #0

## 2019-03-15 NOTE — NC FL2 (Signed)
[place] LEVEL OF CARE SCREENING TOOL     IDENTIFICATION  Patient Name: Zachary [name] Birthdate: 01-25-1930 Sex: male Admission Date (Current Location): 03/01/2019  [place] and [place] Number:  Zachary [name] [id number] [place] and Address:  The [place]. [facility], [place] [address] [place], [place] [address]      Provider Number: [id number]  Attending Physician Name and Address:  Aldine Contes, MD  Relative Name and Phone Number:  Valli Glance; sister, [phone]    Current Level of Care: Hospital Recommended Level of Care: [place] Prior Approval Number:    Date Approved/Denied:   PASRR Number: [id number]  Discharge Plan: Other (Comment)([facility])    Current Diagnoses: Patient Active Problem List   Diagnosis Date Noted  . Streptococcal bacteremia 03/04/2019  . Pneumonia 03/01/2019  . AKI (acute kidney injury) ([place]) 03/01/2019  . Cerebral thrombosis with cerebral infarction 12/17/2017  . Hyperlipidemia   . Vertigo 12/16/2017  . Dry eyes 05/01/2017  . Senile purpura ([place]) 07/18/2016  . Aortic atherosclerosis ([place]) 02/26/2015  . Chronic kidney disease, stage 3 ([place]) 11/08/2013  . Allergic rhinitis 09/13/2013  . Osteoarthritis of left knee 03/07/2013  . Swelling of lower extremity 12/07/2012  . Gastritis 07/06/2012  . Obesity (BMI 30.0-34.9) 07/06/2012  . Squamous cell skin cancer 07/06/2012  . Healthcare maintenance 07/06/2012  . Constipation due to pain medication 06/03/2011  . Arthritis of hip 05/21/2010  . Dizziness 10/30/2006  . Anxiety 06/22/2006  . Major depressive disorder, recurrent severe without psychotic features ([place]) 06/22/2006  . Chronic pain disorder 06/22/2006  . Essential hypertension 06/22/2006  . Gastroesophageal reflux disease with stricture 06/22/2006  . Diverticulosis of colon 06/22/2006  . Benign prostatic hypertrophy with urinary obstruction 06/22/2006  . Facial basal cell  cancer 06/22/2006    Orientation RESPIRATION BLADDER Height & Weight     Self, Time, Situation, Place  Normal Continent Weight: 209 lb 11.2 oz (95.1 kg) Height:  _0  (170.2 cm)  BEHAVIORAL SYMPTOMS/MOOD NEUROLOGICAL BOWEL NUTRITION STATUS      Continent Diet (regular diet; thin liquids)  AMBULATORY STATUS COMMUNICATION OF NEEDS Skin   Limited Assist Verbally Skin abrasions(abrasions on bilateral arms)                       Personal Care Assistance Level of Assistance  Bathing, Feeding, Dressing Bathing Assistance: Limited assistance Feeding assistance: Independent Dressing Assistance: Limited assistance     Functional Limitations Info  Hearing, Sight, Speech Sight Info: Adequate Hearing Info: Adequate Speech Info: Adequate    SPECIAL CARE FACTORS FREQUENCY  OT (By licensed OT), PT (By licensed PT)     PT Frequency: 3x week OT Frequency: 2x week            Contractures Contractures Info: Not present    Additional Factors Info  Code Status, Allergies, Psychotropic Code Status Info: Full Code Allergies Info: Clindamycin/lincomycin, Lincomycin Hcl Psychotropic Info: mirtazapine (REMERON) tablet 30 mg daily at bedtime PO; risperiDONE (RISPERDAL) tablet 0.25 mg daily PO; sertraline (ZOLOFT) tablet 150 mg 2x daily PO         Current Medications (03/15/2019):  This is the current hospital active medication list Current Facility-Administered Medications  Medication Dose Route Frequency Provider Last Rate Last Dose  . 0.9 %  sodium chloride infusion   Intravenous Continuous [name], MD 50 mL/hr at 03/14/19 2116    . acetaminophen (TYLENOL) tablet 650 mg  650 mg Oral Q6H PRN Wilfrid Lund  L III, MD   650 mg at 03/09/19 1541   Or  . acetaminophen (TYLENOL) suppository 650 mg  650 mg Rectal Q6H PRN [name], MD      . albuterol (PROVENTIL) (2.5 MG/3ML) 0.083% nebulizer solution 2.5 mg  2.5 mg Nebulization Q6H PRN [name], MD      .  amLODipine (NORVASC) tablet 10 mg  10 mg Oral Daily [name], MD   10 mg at 03/15/19 1039  . aspirin EC tablet 81 mg  81 mg Oral Daily [name], MD   81 mg at 03/15/19 1038  . atorvastatin (LIPITOR) tablet 40 mg  40 mg Oral q1800 [name], MD   40 mg at 03/14/19 1800  . cefTRIAXone (ROCEPHIN) 2 g in sodium chloride 0.9 % 100 mL IVPB  2 g Intravenous Q24H [name], MD 200 mL/hr at 03/15/19 1045 2 g at 03/15/19 1045  . clopidogrel (PLAVIX) tablet 75 mg  75 mg Oral Daily [name], MD   75 mg at 03/15/19 1039  . diclofenac sodium (VOLTAREN) 1 % transdermal gel 2 g  2 g Topical TID [name], MD   2 g at 03/15/19 1040  . feeding supplement (ENSURE ENLIVE) (ENSURE ENLIVE) liquid 237 mL  237 mL Oral TID BM [name], MD   237 mL at 03/15/19 1044  . ferrous gluconate (FERGON) tablet 324 mg  324 mg Oral Q breakfast Jeanmarie Hubert, MD   324 mg at 03/15/19 0834  . finasteride (PROSCAR) tablet 5 mg  5 mg Oral Daily [name], MD   5 mg at 03/15/19 1038  . fluticasone (FLONASE) 50 MCG/ACT nasal spray 2 spray  2 spray Each Nare Daily [name], MD   2 spray at 03/15/19 1040  . labetalol (NORMODYNE) tablet 100 mg  100 mg Oral TID PRN [name], MD   100 mg at 03/07/19 0604  . LORazepam (ATIVAN) tablet 0.5 mg  0.5 mg Oral Q8H PRN [name], MD   0.5 mg at 03/12/19 2104  . methadone (DOLOPHINE) tablet 5 mg  5 mg Oral Q8H [name], MD   5 mg at 03/15/19 0606  . mirtazapine (REMERON) tablet 30 mg  30 mg Oral QHS [name], MD   30 mg at 03/14/19 2111  . pantoprazole (PROTONIX) EC tablet 40 mg  40 mg Oral Daily [name], MD   40 mg at 03/15/19 1038  . peg 3350 powder (MOVIPREP) kit 100 g  0.5 kit Oral Once [name], MD      . polyethylene glycol (MIRALAX / GLYCOLAX) packet 68 g  68 g Oral Once [name], MD      . polyvinyl alcohol (LIQUIFILM TEARS) 1.4 % ophthalmic  solution 2 drop  2 drop Both Eyes TID [name], MD   2 drop at 03/15/19 1041  . risperiDONE (RISPERDAL) tablet 0.25 mg  0.25 mg Oral BID [name], MD   0.25 mg at 03/15/19 1038  . senna-docusate (Senokot-S) tablet 2 tablet  2 tablet Oral BID [name], MD   2 tablet at 03/15/19 1038  . sertraline (ZOLOFT) tablet 150 mg  150 mg Oral Daily [name], MD   150 mg at 03/15/19 1038  . sodium chloride  flush (NS) 0.9 % injection 3 mL  3 mL Intravenous Q12H [name], MD   3 mL at 03/15/19 1041  . tamsulosin (FLOMAX) capsule 0.4 mg  0.4 mg Oral Daily [name], MD   0.4 mg at 03/15/19 1038  . traMADol (ULTRAM) tablet 50 mg  50 mg Oral Daily PRN [name], MD   50 mg at 03/12/19 2104     Discharge Medications: STOP taking these medications       traMADol 50 MG tablet Commonly known as: ULTRAM             TAKE these medications       acetaminophen 325 MG tablet Commonly known as: TYLENOL Take 650 mg by mouth every 8 (eight) hours as needed for headache.   AmLactin 12 % lotion Generic drug: ammonium lactate Apply 1 application topically 2 (two) times daily as needed for dry skin (apply to both legs and feet).   amLODipine 10 MG tablet Commonly known as: NORVASC Take 1 tablet (10 mg total) by mouth daily. Start taking on: March 16, 2019   amoxicillin 500 MG capsule Commonly known as: AMOXIL Take 2 capsules (1,000 mg total) by mouth 3 (three) times daily.   aspirin 81 MG EC tablet Take 1 tablet (81 mg total) by mouth daily. Swallow whole.   atorvastatin 40 MG tablet Commonly known as: LIPITOR Take 40 mg by mouth at bedtime.   Bandage Roll 3"x75" Misc Use as directed   Biofreeze 4 % Gel Generic drug: Menthol (Topical Analgesic) Apply 1 application topically every 8 (eight) hours as needed (pain (right lower chest)).   carboxymethylcellulose 0.5 % Soln Commonly known as: REFRESH PLUS Place 1 drop  into both eyes 3 (three) times daily.   clopidogrel 75 MG tablet Commonly known as: PLAVIX Take 1 tablet (75 mg total) by mouth daily.   diclofenac sodium 1 % Gel Commonly known as: VOLTAREN Apply 4 g topically 4 (four) times daily as needed.   feeding supplement Liqd Take 237 mLs by mouth 3 (three) times daily with meals.   ferrous gluconate 324 MG tablet Commonly known as: FERGON Take 1 tablet (324 mg total) by mouth daily with breakfast. Start taking on: March 16, 2019   finasteride 5 MG tablet Commonly known as: Proscar Take 1 tablet (5 mg total) by mouth daily.   fluticasone 50 MCG/ACT nasal spray Commonly known as: FLONASE Place 2 sprays into both nostrils daily.   furosemide 40 MG tablet Commonly known as: LASIX Take 1 tablet (40 mg total) by mouth daily.   levocetirizine 5 MG tablet Commonly known as: XYZAL Take 5 mg by mouth at bedtime.   lisinopril 20 MG tablet Commonly known as: ZESTRIL Take 1 tablet (20 mg total) by mouth daily.   LORazepam 0.5 MG tablet Commonly known as: ATIVAN Take 1 tablet (0.5 mg total) by mouth See admin instructions. Take one tablet (0.5 mg) by mouth three times daily, may also take one tablet (0.5 mg) every 12 hours as needed for anxiety   methadone 5 MG tablet Commonly known as: DOLOPHINE Take 1 tablet (5 mg total) by mouth every 8 (eight) hours. What changed: additional instructions   mirtazapine 30 MG tablet Commonly known as: REMERON Take 30 mg by mouth at bedtime.   omeprazole 20 MG capsule Commonly known as: PRILOSEC Take 1 capsule (20 mg total) by mouth daily.   polyethylene glycol powder 17 GM/SCOOP powder Commonly known as:  GLYCOLAX/MIRALAX Take 17 g by mouth daily.   risperiDONE 0.25 MG tablet Commonly known as: RISPERDAL Take 0.25 mg by mouth 2 (two) times daily.   rosuvastatin 20 MG tablet Commonly known as: CRESTOR Take 1 tablet (20 mg total) by mouth daily at 6 PM.   senna 8.6 MG  tablet Commonly known as: SENOKOT Take 3 tablets by mouth at bedtime.   sertraline 100 MG tablet Commonly known as: ZOLOFT Take 100 mg by mouth at bedtime. Take with 50 mg tablet for a total dose of 150 mg at bedtime   sertraline 50 MG tablet Commonly known as: ZOLOFT Take 50 mg by mouth at bedtime. Take with 100 mg tablet for a total dose of 150 mg at bedtime   Simethicone 125 MG Caps Take 1 capsule (125 mg total) by mouth 4 (four) times daily as needed. What changed:   when to take this  reasons to take this   Tab-A-Vite Tabs Take 1 tablet by mouth daily.   tamsulosin 0.4 MG Caps capsule Commonly known as: Flomax Take 1 capsule (0.4 mg total) by mouth daily.   wound dressings gel Apply topically as needed for wound care.      Relevant Imaging Results:  Relevant Lab Results:   Additional Information SS# [id number]; COVID negative on 9/23  Evlyn Kanner [place], [place]

## 2019-03-15 NOTE — Social Work (Signed)
Clinical Social Worker facilitated patient discharge including contacting patient family and facility to confirm patient discharge plans.  Clinical information faxed to facility and family agreeable with plan.  CSW arranged ambulance transport via [facility] to [facility] RN to call [phone]  with report prior to discharge.  Clinical Social Worker will sign off for now as social work intervention is no longer needed. Please consult [place] again if new need arises.  Westley Hummer, MSW, [place] Social Worker [phone]

## 2019-03-15 NOTE — Progress Notes (Signed)
Subjective: Patient seen at the bedside this AM. The patient feels well overall today. We discussed the risk and benefits of refusing a PICC line. The patient understands there is an increased risk of stroke or worsening of the underlying infection.  He understands oral antibiotics is likely less effective than IV antibiotics commended.  He understands the consequences is potentially death, and is at peace with that decision.  No other complaints at this time.  Patient understands that he will be discharged today.  Objective:  Vital signs in last 24 hours: Vitals:   03/14/19 0423 03/14/19 1627 03/14/19 2141 03/15/19 0513  BP: (!) 163/68 (!) 163/71 (!) 167/74 (!) 165/78  Pulse: 69 82 77 71  Resp: 16 17 16 15   Temp: 97.7 F (36.5 C) 98.8 F (37.1 C) 98.4 F (36.9 C) 98.3 F (36.8 C)  TempSrc: Oral Oral Oral Oral  SpO2: 97% 99% 97% 98%  Weight:      Height:       Physical exam: General: Laying in bed, NAD HEENT: NCAT CV: Regular rate and rhythm. S1 and S2 normal no murmurs appreciated. No lower extremity edema. PULM: Clear to auscultation bilaterally ABD: Bowel sounds present, no tenderness to palpation NEURO: Alert and oriented x4, no focal deficits  Assessment/Plan:  Principal Problem:   Streptococcal bacteremia Active Problems:   Essential hypertension   Dizziness   Pneumonia   AKI (acute kidney injury) ([place])  In summary Mr. Kerstein is a 85 year old male with a history of HTN, BPH, CKD stage III, CVA in 2019 and chronic pain who presented from an ALF with a 1 to 2-week history of fatigue and dysphasia with solid foods. Pt was treated for aspiration pneumonia and course was complicated by Strep bovis + cultures, raising concerns for endocarditis and malignancy.  Patient received EGD and colonoscopy did not show evidence of malignancy. TEE did show echogenic structure on the pulmonic valve that cannot exclude vegetation.  Patient has refused the PICC line and IV antibiotics  so the plan will be to continue oral antibiotics.  #Aspiration pneumonia #Strep bovis bacteremia #:Pulmonic valve vegetation?: Pneumonia resolved. EGD and colonoscopy did not show evidence of malignancy. Biopsies of gastric mucosa were taken and are pending. TEE showed pulmonic valve echogenic structure that could not rule out vegetation. - Patient continues to refusee the PICC line today. Discussed risks and benefits of having the PICC line and the risks assassinated with not getting IV abx. Pt understands this may be a lethal decision and is at peace with it. The patient needs 6 weeks of antibiotics  - Discharged on Amoxicillin 1000 mg 3 times daily (stop date 10/29) - Follow-up gastric biopsies  #Anemia - Continue ferrous gluconate 324 mg   #AKI: Creatinine decreased to 1.44 from 1.61 yesterday - Continue holding Lasix 40 mg daily - Continue holding lisinopril 20 mg daily - IVF NS 50 cc/hr   #Hyperkalemia: K+ elevated to 5.4 today  - Start home Lasix 40 mg daily upon discharge - Follow up BMP in outpatient setting   #Hx CVA in 2019 #HTN: Pt has had elevated pressures intermittently, 165/78 this AM. - Amlodipine 10 mg daily - Labetalol 100 mg 3 times daily PRN - aspirin 81 mg, Plavix 75 mg - atorvastatin 40 mg daily   #BPH - Flomax 0.4 mg daily - Finasteride 5 mg daily   #Chronic pain: On chronic pain medications at baseline - Methadone 5 mg TID - Tramadol 50 mg once daily PRN   #  FEN/GI - Diet:2 g sodium, NPO at midnight in preparation for TEE - IVF: NS 50 cc/hr as noted above   #VTE ppx: SQ Lovenox    #Code status: Full   #Dispo:  Discharge back to ALF today.  [name], MD Internal Medicine, PGY1 Pager: [phone]  03/15/2019,3:02 PM

## 2019-03-15 NOTE — TOC Progression Note (Signed)
Transition of Care [facility]) - Progression Note    Patient Details  Name: Zachary [name] MRN: [id number] Date of Birth: 07-05-1929  Transition [facility]) CM/SW [place], [place] Phone Number: 03/15/2019, 10:12 AM  Clinical Narrative:    Pt has refused iv abx and would like to return to [facility] with oral medications. CSW called and left messages x2 with Durenda Age. Pt will need FL2 addendum completed prior to dc. This will be relayed to MD. Pt also will need dc summary, order, and signed prescriptions for any controlled medication.    Expected Discharge Plan: Assisted Living Barriers to Discharge: Continued Medical Work up  Expected Discharge Plan and Services Expected Discharge Plan: Assisted Living In-house Referral: Clinical Social Work Discharge Planning Services: CM Consult Post Acute Care Choice: [place] arrangements for the past 2 months: [place] Expected Discharge Date: 03/04/19               HH Arranged: PT, OT HH Agency: [facility] [place]   Social Determinants of Health (SDOH) Interventions    Readmission Risk Interventions No flowsheet data found.

## 2019-03-15 NOTE — Progress Notes (Signed)
Pt going back to [facility] this pm, waiting for ptar pickup. Report called and given to facility caregiver

## 2019-03-15 NOTE — Progress Notes (Signed)
Physical Therapy Treatment Patient Details Name: Zachary [name] MRN: [id number] DOB: April 07, 1930 Today's Date: 03/15/2019    History of Present Illness Zachary [name] is a 85 y.o. male admitted from ALF on 03/01/19 with with 1-2 wk history of fatigue and SOB. Pt worked up for aspiration PNA, strep bovis bacteremia. TEE showed pulmonic valve echogenic structure that could not rule out vegetation; pt refused PICC placement.PMH includes CAD, arthritis, cataracts, chronic renal insufficiency, diverticulosis, GERD, obesity, CHF.   PT Comments    Pt progressing well with mobility. Able to increase ambulation distance with RW at supervision-level; encouraged use of RW when pt returns to ALF to decrease fall risk. Pt continues to have DOE with minimal exertion. Encouraged continued use of incentive spirometer; able to perform 10x with correct technique. Pt planning to d/c to ALF today.   Follow Up Recommendations  Home health PT;Supervision for mobility/OOB     Equipment Recommendations  None recommended by PT(reports he owns RW)    Recommendations for Other Services       Precautions / Restrictions Precautions Precautions: Fall Restrictions Weight Bearing Restrictions: No    Mobility  Bed Mobility               General bed mobility comments: Pt seated in recliner on arrival.  Transfers Overall transfer level: Needs assistance Equipment used: Rolling walker (2 wheeled) Transfers: Sit to/from Stand Sit to Stand: Supervision            Ambulation/Gait Ambulation/Gait assistance: Supervision;Min guard Gait Distance (Feet): 180 Feet Assistive device: Rolling walker (2 wheeled) Gait Pattern/deviations: Step-through pattern;Decreased stride length Gait velocity: Decreased   General Gait Details: Slow, mostly steady gait with RW; pt talking during most of walk and quick to become SOB, responded well to cues to focus on breathing instead of talking while ambulating. Intermittent  standing rest breaks due to fatigue/DOE   Stairs             Wheelchair Mobility    Modified Rankin (Stroke Patients Only)       Balance   Sitting-balance support: Feet supported Sitting balance-Leahy Scale: Fair       Standing balance-Leahy Scale: Fair Standing balance comment: Can static stand to perform ADl tasks at sink without UE support, min guard                            Cognition Arousal/Alertness: Awake/alert Behavior During Therapy: WFL for tasks assessed/performed Overall Cognitive Status: Within Functional Limits for tasks assessed                                 General Comments: WFL for simple tasks; poor short-term memory likely baseline, tangential with speech which impairs attention      Exercises      General Comments General comments (skin integrity, edema, etc.): Pt able to demonstrate correct technique with incentive spirometer, performed 10x after ambulating      Pertinent Vitals/Pain Pain Assessment: No/denies pain    Home Living                      Prior Function            PT Goals (current goals can now be found in the care plan section) Acute Rehab PT Goals Patient Stated Goal: Return to ALF today PT Goal Formulation: With patient Time For Goal  Achievement: 03/22/19 Potential to Achieve Goals: Good Progress towards PT goals: Progressing toward goals    Frequency    Min 3X/week      PT Plan Current plan remains appropriate    Co-evaluation              AM-PAC PT "6 Clicks" Mobility   Outcome Measure  Help needed turning from your back to your side while in a flat bed without using bedrails?: None Help needed moving from lying on your back to sitting on the side of a flat bed without using bedrails?: None Help needed moving to and from a bed to a chair (including a wheelchair)?: A Little Help needed standing up from a chair using your arms (e.g., wheelchair or bedside  chair)?: A Little Help needed to walk in hospital room?: A Little Help needed climbing 3-5 steps with a railing? : A Little 6 Click Score: 20    End of Session Equipment Utilized During Treatment: Gait belt Activity Tolerance: Patient tolerated treatment well Patient left: in chair;with call bell/phone within reach;with chair alarm set Nurse Communication: Mobility status PT Visit Diagnosis: Unsteadiness on feet (R26.81);Repeated falls (R29.6);Muscle weakness (generalized) (M62.81)     Time: 4975-3005 PT Time Calculation (min) (ACUTE ONLY): 21 min  Charges:  $Gait Training: 8-22 mins                    Mabeline Caras, PT, DPT Acute Rehabilitation Services  Pager [phone] Office [place] 03/15/2019, 3:03 PM

## 2019-03-15 NOTE — TOC Transition Note (Signed)
Transition of Care [facility]) - CM/SW Discharge Note   Patient Details  Name: Zachary [name] MRN: [id number] Date of Birth: 1929/08/27  Transition of [facility]) CM/SW Contact:  Alexander [place], [place] Phone Number: 03/15/2019, 2:03 PM   Clinical Narrative:    Pt stable for transport, PTAR called for next available, scripts complete. Pt PTAR papers on chart. RN aware.   Final next level of care: Assisted Living Barriers to Discharge: Barriers Resolved   Patient Goals and CMS Choice Patient states their goals for this hospitalization and ongoing recovery are:: to have some answers about how I feel CMS Medicare.gov Compare Post Acute Care list provided to:: (n/a) Choice offered to / list presented to : Patient  Discharge Placement              Patient chooses bed at: Other - please specify in the comment section below:([facility]) Patient to be transferred to facility by: [place] Name of family member notified: pt A&O x4 Patient and family notified of of transfer: 03/15/19  Discharge Plan and Services In-house Referral: Clinical Social Work Discharge Planning Services: CM Consult Post Acute Care Choice: Skilled Nursing Facility                    HH Arranged: PT, OT [place] Agency: [facility]        Social Determinants of Health (SDOH) Interventions     Readmission Risk Interventions Readmission Risk Prevention Plan 03/15/2019  Transportation Screening Complete  PCP or Specialist Appt within 3-5 Days Not Complete  Not Complete comments pt going back to ALF  HRI or [place] Complete  Social Work Consult for [place] Planning/Counseling Complete  Palliative Care Screening Not Applicable  Medication Review Press photographer) Complete  Some recent data might be hidden

## 2019-03-20 ENCOUNTER — Encounter: Payer: Self-pay | Admitting: Internal Medicine

## 2019-03-26 ENCOUNTER — Emergency Department (HOSPITAL_COMMUNITY): Payer: Medicare Other

## 2019-03-26 ENCOUNTER — Emergency Department (HOSPITAL_COMMUNITY)
Admission: EM | Admit: 2019-03-26 | Discharge: 2019-03-27 | Disposition: A | Discharge status: Home / Self Care | Payer: Medicare Other | Attending: Emergency Medicine | Admitting: Emergency Medicine

## 2019-03-26 ENCOUNTER — Other Ambulatory Visit: Payer: Self-pay

## 2019-03-26 ENCOUNTER — Encounter (HOSPITAL_COMMUNITY): Payer: Self-pay | Admitting: Emergency Medicine

## 2019-03-26 DIAGNOSIS — Z87891 Personal history of nicotine dependence: Secondary | ICD-10-CM | POA: Insufficient documentation

## 2019-03-26 DIAGNOSIS — N183 Chronic kidney disease, stage 3 unspecified: Secondary | ICD-10-CM | POA: Diagnosis not present

## 2019-03-26 DIAGNOSIS — R079 Chest pain, unspecified: Secondary | ICD-10-CM | POA: Diagnosis not present

## 2019-03-26 DIAGNOSIS — Z96642 Presence of left artificial hip joint: Secondary | ICD-10-CM | POA: Insufficient documentation

## 2019-03-26 DIAGNOSIS — Z85828 Personal history of other malignant neoplasm of skin: Secondary | ICD-10-CM | POA: Diagnosis not present

## 2019-03-26 DIAGNOSIS — I129 Hypertensive chronic kidney disease with stage 1 through stage 4 chronic kidney disease, or unspecified chronic kidney disease: Secondary | ICD-10-CM | POA: Insufficient documentation

## 2019-03-26 LAB — BASIC METABOLIC PANEL
Anion gap: 11 (ref 5–15)
BUN: 22 mg/dL (ref 8–23)
CO2: 24 mmol/L (ref 22–32)
Calcium: 8.6 mg/dL — ABNORMAL LOW (ref 8.9–10.3)
Chloride: 101 mmol/L (ref 98–111)
Creatinine, Ser: 1.99 mg/dL — ABNORMAL HIGH (ref 0.61–1.24)
GFR calc Af Amer: 34 mL/min — ABNORMAL LOW (ref >60)
GFR calc non Af Amer: 29 mL/min — ABNORMAL LOW (ref >60)
Glucose, Bld: 124 mg/dL — ABNORMAL HIGH (ref 70–99)
Potassium: 4.4 mmol/L (ref 3.5–5.1)
Sodium: 136 mmol/L (ref 135–145)

## 2019-03-26 LAB — CBC
HCT: 38 % — ABNORMAL LOW (ref 39.0–52.0)
Hemoglobin: 12.1 g/dL — ABNORMAL LOW (ref 13.0–17.0)
MCH: 30.7 pg (ref 26.0–34.0)
MCHC: 31.8 g/dL (ref 30.0–36.0)
MCV: 96.4 fL (ref 80.0–100.0)
Platelets: 193 10*3/uL (ref 150–400)
RBC: 3.94 MIL/uL — ABNORMAL LOW (ref 4.22–5.81)
RDW: 14.1 % (ref 11.5–15.5)
WBC: 5.7 10*3/uL (ref 4.0–10.5)
nRBC: 0 % (ref 0.0–0.2)

## 2019-03-26 LAB — TROPONIN I (HIGH SENSITIVITY)
Troponin I (High Sensitivity): 4 ng/L (ref <18)
Troponin I (High Sensitivity): 4 ng/L (ref <18)

## 2019-03-26 MED ORDER — SODIUM CHLORIDE 0.9% FLUSH
3.0000 mL | Freq: Once | INTRAVENOUS | Status: AC
  Administered 2019-03-26: 3 mL via INTRAVENOUS

## 2019-03-26 MED ORDER — AMOXICILLIN 500 MG PO CAPS
1000.0000 mg | ORAL_CAPSULE | Freq: Once | ORAL | Status: AC
  Administered 2019-03-26: 1000 mg via ORAL
  Filled 2019-03-26: qty 2

## 2019-03-26 NOTE — ED Notes (Signed)
ED Provider at bedside. 
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
## 2019-03-26 NOTE — Discharge Instructions (Addendum)
You were evaluated in the Emergency Department and after careful evaluation, we did not find any emergent condition requiring admission or further testing in the hospital. ° °Your exam/testing today was overall reassuring. ° °Please return to the Emergency Department if you experience any worsening of your condition.  We encourage you to follow up with a primary care provider.  Thank you for allowing us to be a part of your care. ° °

## 2019-03-26 NOTE — ED Triage Notes (Signed)
Pt arrives from [facility] with R sided CP that radiates to jaw. Pt had recent hospitalization for PNA. Denies SOB, n/v or dizziness. 18G LAC. Denies CP at this time

## 2019-03-27 ENCOUNTER — Ambulatory Visit: Payer: Medicare Other | Admitting: Podiatry

## 2019-03-27 NOTE — ED Notes (Signed)
Pt verbalized understanding of discharge instructions. No question at current time. Carelink info sent with patient to give to facility.

## 2019-04-08 NOTE — Telephone Encounter (Signed)
Was still in hosp when this was requested, pt is in a snf

## 2019-04-23 ENCOUNTER — Ambulatory Visit: Payer: Medicare Other | Admitting: Gastroenterology

## 2019-11-01 ENCOUNTER — Emergency Department (HOSPITAL_COMMUNITY)
Admission: EM | Admit: 2019-11-01 | Discharge: 2019-11-02 | Disposition: A | Discharge status: Home / Self Care | Payer: Medicare Other | Attending: Emergency Medicine | Admitting: Emergency Medicine

## 2019-11-01 ENCOUNTER — Emergency Department (HOSPITAL_COMMUNITY): Payer: Medicare Other

## 2019-11-01 ENCOUNTER — Encounter (HOSPITAL_COMMUNITY): Payer: Self-pay

## 2019-11-01 DIAGNOSIS — Z20822 Contact with and (suspected) exposure to covid-19: Secondary | ICD-10-CM | POA: Insufficient documentation

## 2019-11-01 DIAGNOSIS — Z79899 Other long term (current) drug therapy: Secondary | ICD-10-CM | POA: Diagnosis not present

## 2019-11-01 DIAGNOSIS — N183 Chronic kidney disease, stage 3 unspecified: Secondary | ICD-10-CM | POA: Diagnosis not present

## 2019-11-01 DIAGNOSIS — R05 Cough: Secondary | ICD-10-CM | POA: Insufficient documentation

## 2019-11-01 DIAGNOSIS — Z87891 Personal history of nicotine dependence: Secondary | ICD-10-CM | POA: Insufficient documentation

## 2019-11-01 DIAGNOSIS — I129 Hypertensive chronic kidney disease with stage 1 through stage 4 chronic kidney disease, or unspecified chronic kidney disease: Secondary | ICD-10-CM | POA: Insufficient documentation

## 2019-11-01 DIAGNOSIS — R059 Cough, unspecified: Secondary | ICD-10-CM

## 2019-11-01 LAB — BASIC METABOLIC PANEL
Anion gap: 10 (ref 5–15)
BUN: 24 mg/dL — ABNORMAL HIGH (ref 8–23)
CO2: 23 mmol/L (ref 22–32)
Calcium: 8.5 mg/dL — ABNORMAL LOW (ref 8.9–10.3)
Chloride: 98 mmol/L (ref 98–111)
Creatinine, Ser: 2.18 mg/dL — ABNORMAL HIGH (ref 0.61–1.24)
GFR calc Af Amer: 30 mL/min — ABNORMAL LOW (ref >60)
GFR calc non Af Amer: 26 mL/min — ABNORMAL LOW (ref >60)
Glucose, Bld: 108 mg/dL — ABNORMAL HIGH (ref 70–99)
Potassium: 4.5 mmol/L (ref 3.5–5.1)
Sodium: 131 mmol/L — ABNORMAL LOW (ref 135–145)

## 2019-11-01 LAB — CBC
HCT: 35.2 % — ABNORMAL LOW (ref 39.0–52.0)
Hemoglobin: 11.9 g/dL — ABNORMAL LOW (ref 13.0–17.0)
MCH: 33.5 pg (ref 26.0–34.0)
MCHC: 33.8 g/dL (ref 30.0–36.0)
MCV: 99.2 fL (ref 80.0–100.0)
Platelets: 169 10*3/uL (ref 150–400)
RBC: 3.55 MIL/uL — ABNORMAL LOW (ref 4.22–5.81)
RDW: 12.6 % (ref 11.5–15.5)
WBC: 7.6 10*3/uL (ref 4.0–10.5)
nRBC: 0 % (ref 0.0–0.2)

## 2019-11-01 NOTE — ED Triage Notes (Signed)
Pt bib [facility] from [facility] for eval of cough and elevated wbc count. Pt denies SOB.

## 2019-11-02 LAB — POC SARS CORONAVIRUS 2 AG -  ED: SARS Coronavirus 2 Ag: NEGATIVE

## 2019-11-02 MED ORDER — BENZONATATE 100 MG PO CAPS: 200.0000 mg | ORAL_CAPSULE | Freq: Two times a day (BID) | ORAL | 0 refills | Status: DC | PRN

## 2019-11-02 MED ORDER — SODIUM CHLORIDE 0.9 % IV BOLUS
1000.0000 mL | Freq: Once | INTRAVENOUS | Status: AC
  Administered 2019-11-02: 1000 mL via INTRAVENOUS

## 2019-11-02 NOTE — ED Notes (Signed)
Breakfast Ordered 

## 2019-11-02 NOTE — ED Notes (Signed)
Ptar called 
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
## 2019-11-02 NOTE — Discharge Instructions (Signed)
You chest x-ray reveals no evidence of pneumonia.  Your blood work indicated that you were mildly dehydrated.  For this you were given IV fluid.  Continue to stay hydrated.  Your COVID test was negative.  You are cleared to be released from the ER.  Please follow-up with your doctor or return for new or worsening symptoms.

## 2019-11-26 ENCOUNTER — Ambulatory Visit (INDEPENDENT_AMBULATORY_CARE_PROVIDER_SITE_OTHER): Payer: Medicare Other | Admitting: Dermatology

## 2019-11-26 ENCOUNTER — Encounter: Payer: Self-pay | Admitting: Dermatology

## 2019-11-26 ENCOUNTER — Other Ambulatory Visit: Payer: Self-pay

## 2019-11-26 DIAGNOSIS — D485 Neoplasm of uncertain behavior of skin: Secondary | ICD-10-CM

## 2019-11-26 DIAGNOSIS — C4492 Squamous cell carcinoma of skin, unspecified: Secondary | ICD-10-CM

## 2019-11-26 DIAGNOSIS — D0439 Carcinoma in situ of skin of other parts of face: Secondary | ICD-10-CM | POA: Diagnosis not present

## 2019-11-26 DIAGNOSIS — Z85828 Personal history of other malignant neoplasm of skin: Secondary | ICD-10-CM

## 2019-11-26 DIAGNOSIS — L570 Actinic keratosis: Secondary | ICD-10-CM

## 2019-11-26 DIAGNOSIS — C44629 Squamous cell carcinoma of skin of left upper limb, including shoulder: Secondary | ICD-10-CM

## 2019-11-26 HISTORY — DX: Squamous cell carcinoma of skin, unspecified: C44.92

## 2019-11-29 ENCOUNTER — Telehealth: Payer: Self-pay | Admitting: Dermatology

## 2019-11-29 NOTE — Telephone Encounter (Signed)
Patient is calling for pathology results.  (Chart # [id number])

## 2019-12-02 ENCOUNTER — Encounter: Payer: Self-pay | Admitting: Dermatology

## 2019-12-02 NOTE — Telephone Encounter (Signed)
Phone call to patient with his pathology results.  Voicemail full unable to leave a message.

## 2019-12-02 NOTE — Telephone Encounter (Signed)
Patient calling again for results.  He has had phone issues and supposed to be able to receive calls now.

## 2019-12-02 NOTE — Telephone Encounter (Signed)
-----   Message from Lavonna Monarch, MD sent at 11/27/2019  8:59 PM EDT ----- Schedule Mohs

## 2019-12-02 NOTE — Telephone Encounter (Signed)
Phone call to patient returning his call.  No answer voicemail full.

## 2019-12-04 NOTE — Telephone Encounter (Signed)
Phone call to patient with his pathology results. Patient aware of results.  

## 2019-12-21 ENCOUNTER — Encounter: Payer: Self-pay | Admitting: Dermatology

## 2019-12-21 NOTE — Progress Notes (Signed)
   Follow-Up Visit   Subjective  Zachary [name] is a 86 y.o. male who presents for the following: Skin Problem (possible skin cancers on face and arms, no bleeding).  Growths Location: All sun exposed skin, left nose and near left elbow are the 2 most bothersome Duration:  Quality:  Associated Signs/Symptoms: Modifying Factors:  Severity:  Timing: Context:   The following portions of the chart were reviewed this encounter and updated as appropriate: Tobacco  Allergies  Meds  Problems  Med Hx  Surg Hx  Fam Hx      Objective  Well appearing patient in no apparent distress; mood and affect are within normal limits.  All sun exposed areas plus back examined.   Assessment & Plan  Neoplasm of uncertain behavior of skin (2) Left Ala [place]  Skin / nail biopsy Type of biopsy: tangential   Informed consent: discussed and consent obtained   Timeout: patient name, date of birth, surgical site, and procedure verified   Procedure prep:  Patient was prepped and draped in usual sterile fashion Prep type:  Chlorhexidine Anesthesia: the lesion was anesthetized in a standard fashion   Anesthetic:  1% lidocaine w/ epinephrine 1-100,000 local infiltration Instrument used: flexible razor blade   Hemostasis achieved with: ferric subsulfate   Outcome: patient tolerated procedure well   Post-procedure details: wound care instructions given    Specimen 1 - Surgical pathology Differential Diagnosis: bcc vs scc Check Margins: No  Left Elbow - Posterior  Skin / nail biopsy Type of biopsy: tangential   Informed consent: discussed and consent obtained   Timeout: patient name, date of birth, surgical site, and procedure verified   Procedure prep:  Patient was prepped and draped in usual sterile fashion Prep type:  Chlorhexidine Anesthesia: the lesion was anesthetized in a standard fashion   Anesthetic:  1% lidocaine w/ epinephrine 1-100,000 local infiltration Instrument used: flexible  razor blade   Hemostasis achieved with: ferric subsulfate   Outcome: patient tolerated procedure well   Post-procedure details: wound care instructions given    Specimen 2 - Surgical pathology Differential Diagnosis: bcc vs scc Check Margins: No  AK (actinic keratosis) (2) Left Forehead; Right Forehead  Since Zachary [name] says that these are currently not bothersome, intervention deferred. [name] has a history of many dozen previous nonmelanoma skin cancers.  Today there are multiple pink waxy crusts on the arms and face and scalp and ears which represent a mixture of nonmelanoma cancers and multiple actinic keratoses.  The only 2 that currently bother him are those near the left elbow and on the left nostril; tangential biopsies obtained.  The nasal lesion would best be treated with Mohs surgery and this was already discussed with [name].

## 2020-01-13 ENCOUNTER — Inpatient Hospital Stay (HOSPITAL_COMMUNITY)
Admission: EM | Admit: 2020-01-13 | Discharge: 2020-01-15 | DRG: 194 | Disposition: A | Discharge status: Nursing Home (Includes ICF) | Payer: Medicare Other | Attending: Internal Medicine | Admitting: Internal Medicine

## 2020-01-13 ENCOUNTER — Other Ambulatory Visit: Payer: Self-pay

## 2020-01-13 ENCOUNTER — Emergency Department (HOSPITAL_COMMUNITY): Payer: Medicare Other

## 2020-01-13 DIAGNOSIS — R350 Frequency of micturition: Secondary | ICD-10-CM | POA: Diagnosis present

## 2020-01-13 DIAGNOSIS — Z20822 Contact with and (suspected) exposure to covid-19: Secondary | ICD-10-CM | POA: Diagnosis present

## 2020-01-13 DIAGNOSIS — Z8249 Family history of ischemic heart disease and other diseases of the circulatory system: Secondary | ICD-10-CM

## 2020-01-13 DIAGNOSIS — R3915 Urgency of urination: Secondary | ICD-10-CM | POA: Diagnosis present

## 2020-01-13 DIAGNOSIS — J180 Bronchopneumonia, unspecified organism: Secondary | ICD-10-CM | POA: Diagnosis not present

## 2020-01-13 DIAGNOSIS — F419 Anxiety disorder, unspecified: Secondary | ICD-10-CM | POA: Diagnosis present

## 2020-01-13 DIAGNOSIS — Z7902 Long term (current) use of antithrombotics/antiplatelets: Secondary | ICD-10-CM

## 2020-01-13 DIAGNOSIS — Z6830 Body mass index (BMI) 30.0-30.9, adult: Secondary | ICD-10-CM

## 2020-01-13 DIAGNOSIS — N189 Chronic kidney disease, unspecified: Secondary | ICD-10-CM

## 2020-01-13 DIAGNOSIS — F332 Major depressive disorder, recurrent severe without psychotic features: Secondary | ICD-10-CM | POA: Diagnosis present

## 2020-01-13 DIAGNOSIS — I129 Hypertensive chronic kidney disease with stage 1 through stage 4 chronic kidney disease, or unspecified chronic kidney disease: Secondary | ICD-10-CM | POA: Diagnosis present

## 2020-01-13 DIAGNOSIS — Z79891 Long term (current) use of opiate analgesic: Secondary | ICD-10-CM

## 2020-01-13 DIAGNOSIS — N179 Acute kidney failure, unspecified: Secondary | ICD-10-CM | POA: Diagnosis present

## 2020-01-13 DIAGNOSIS — N184 Chronic kidney disease, stage 4 (severe): Secondary | ICD-10-CM | POA: Diagnosis present

## 2020-01-13 DIAGNOSIS — Z85828 Personal history of other malignant neoplasm of skin: Secondary | ICD-10-CM

## 2020-01-13 DIAGNOSIS — J189 Pneumonia, unspecified organism: Secondary | ICD-10-CM | POA: Diagnosis present

## 2020-01-13 DIAGNOSIS — G894 Chronic pain syndrome: Secondary | ICD-10-CM | POA: Diagnosis present

## 2020-01-13 DIAGNOSIS — Z79899 Other long term (current) drug therapy: Secondary | ICD-10-CM

## 2020-01-13 DIAGNOSIS — Z8673 Personal history of transient ischemic attack (TIA), and cerebral infarction without residual deficits: Secondary | ICD-10-CM

## 2020-01-13 DIAGNOSIS — Z7982 Long term (current) use of aspirin: Secondary | ICD-10-CM

## 2020-01-13 DIAGNOSIS — Z800 Family history of malignant neoplasm of digestive organs: Secondary | ICD-10-CM

## 2020-01-13 DIAGNOSIS — I10 Essential (primary) hypertension: Secondary | ICD-10-CM | POA: Diagnosis present

## 2020-01-13 DIAGNOSIS — Z87891 Personal history of nicotine dependence: Secondary | ICD-10-CM

## 2020-01-13 DIAGNOSIS — E669 Obesity, unspecified: Secondary | ICD-10-CM | POA: Diagnosis present

## 2020-01-13 LAB — CBC
HCT: 34.6 % — ABNORMAL LOW (ref 39.0–52.0)
Hemoglobin: 11.1 g/dL — ABNORMAL LOW (ref 13.0–17.0)
MCH: 32.2 pg (ref 26.0–34.0)
MCHC: 32.1 g/dL (ref 30.0–36.0)
MCV: 100.3 fL — ABNORMAL HIGH (ref 80.0–100.0)
Platelets: 237 10*3/uL (ref 150–400)
RBC: 3.45 MIL/uL — ABNORMAL LOW (ref 4.22–5.81)
RDW: 12.8 % (ref 11.5–15.5)
WBC: 7.6 10*3/uL (ref 4.0–10.5)
nRBC: 0 % (ref 0.0–0.2)

## 2020-01-13 LAB — BASIC METABOLIC PANEL
Anion gap: 11 (ref 5–15)
BUN: 43 mg/dL — ABNORMAL HIGH (ref 8–23)
CO2: 21 mmol/L — ABNORMAL LOW (ref 22–32)
Calcium: 8.7 mg/dL — ABNORMAL LOW (ref 8.9–10.3)
Chloride: 100 mmol/L (ref 98–111)
Creatinine, Ser: 2.7 mg/dL — ABNORMAL HIGH (ref 0.61–1.24)
GFR calc Af Amer: 23 mL/min — ABNORMAL LOW (ref >60)
GFR calc non Af Amer: 20 mL/min — ABNORMAL LOW (ref >60)
Glucose, Bld: 116 mg/dL — ABNORMAL HIGH (ref 70–99)
Potassium: 4.6 mmol/L (ref 3.5–5.1)
Sodium: 132 mmol/L — ABNORMAL LOW (ref 135–145)

## 2020-01-13 LAB — TROPONIN I (HIGH SENSITIVITY): Troponin I (High Sensitivity): 9 ng/L (ref <18)

## 2020-01-13 MED ORDER — SODIUM CHLORIDE 0.9 % IV SOLN
1.0000 g | Freq: Once | INTRAVENOUS | Status: AC
  Administered 2020-01-13: 1 g via INTRAVENOUS
  Filled 2020-01-13: qty 10

## 2020-01-13 MED ORDER — SODIUM CHLORIDE 0.9% FLUSH: 3.0000 mL | Freq: Once | INTRAVENOUS | Status: DC

## 2020-01-13 MED ORDER — AZITHROMYCIN 250 MG PO TABS
500.0000 mg | ORAL_TABLET | Freq: Once | ORAL | Status: AC
  Administered 2020-01-13: 500 mg via ORAL
  Filled 2020-01-13: qty 2

## 2020-01-13 MED ORDER — SODIUM CHLORIDE 0.9 % IV BOLUS
500.0000 mL | Freq: Once | INTRAVENOUS | Status: AC
  Administered 2020-01-13: 500 mL via INTRAVENOUS
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
## 2020-01-13 NOTE — ED Triage Notes (Signed)
Pt bib ems from [facility] for intermittent chest pressure for weeks. Pt also with cough. Denies NVD. 12 lead unremarkable. VSS with EMS. On arrival pt with no complaints of pain.

## 2020-01-14 ENCOUNTER — Encounter (HOSPITAL_COMMUNITY): Payer: Self-pay | Admitting: Family Medicine

## 2020-01-14 ENCOUNTER — Emergency Department (HOSPITAL_COMMUNITY): Payer: Medicare Other

## 2020-01-14 DIAGNOSIS — N179 Acute kidney failure, unspecified: Secondary | ICD-10-CM | POA: Diagnosis present

## 2020-01-14 DIAGNOSIS — J189 Pneumonia, unspecified organism: Secondary | ICD-10-CM | POA: Diagnosis not present

## 2020-01-14 DIAGNOSIS — F332 Major depressive disorder, recurrent severe without psychotic features: Secondary | ICD-10-CM | POA: Diagnosis present

## 2020-01-14 DIAGNOSIS — Z7982 Long term (current) use of aspirin: Secondary | ICD-10-CM | POA: Diagnosis not present

## 2020-01-14 DIAGNOSIS — Z87891 Personal history of nicotine dependence: Secondary | ICD-10-CM | POA: Diagnosis not present

## 2020-01-14 DIAGNOSIS — Z20822 Contact with and (suspected) exposure to covid-19: Secondary | ICD-10-CM | POA: Diagnosis present

## 2020-01-14 DIAGNOSIS — Z8673 Personal history of transient ischemic attack (TIA), and cerebral infarction without residual deficits: Secondary | ICD-10-CM

## 2020-01-14 DIAGNOSIS — J180 Bronchopneumonia, unspecified organism: Secondary | ICD-10-CM | POA: Diagnosis present

## 2020-01-14 DIAGNOSIS — Z6830 Body mass index (BMI) 30.0-30.9, adult: Secondary | ICD-10-CM | POA: Diagnosis not present

## 2020-01-14 DIAGNOSIS — N184 Chronic kidney disease, stage 4 (severe): Secondary | ICD-10-CM | POA: Diagnosis present

## 2020-01-14 DIAGNOSIS — Z79899 Other long term (current) drug therapy: Secondary | ICD-10-CM | POA: Diagnosis not present

## 2020-01-14 DIAGNOSIS — Z800 Family history of malignant neoplasm of digestive organs: Secondary | ICD-10-CM | POA: Diagnosis not present

## 2020-01-14 DIAGNOSIS — R350 Frequency of micturition: Secondary | ICD-10-CM | POA: Diagnosis present

## 2020-01-14 DIAGNOSIS — G894 Chronic pain syndrome: Secondary | ICD-10-CM | POA: Diagnosis present

## 2020-01-14 DIAGNOSIS — I129 Hypertensive chronic kidney disease with stage 1 through stage 4 chronic kidney disease, or unspecified chronic kidney disease: Secondary | ICD-10-CM | POA: Diagnosis present

## 2020-01-14 DIAGNOSIS — R3915 Urgency of urination: Secondary | ICD-10-CM | POA: Diagnosis present

## 2020-01-14 DIAGNOSIS — E669 Obesity, unspecified: Secondary | ICD-10-CM | POA: Diagnosis present

## 2020-01-14 DIAGNOSIS — Z8249 Family history of ischemic heart disease and other diseases of the circulatory system: Secondary | ICD-10-CM | POA: Diagnosis not present

## 2020-01-14 DIAGNOSIS — Z7902 Long term (current) use of antithrombotics/antiplatelets: Secondary | ICD-10-CM | POA: Diagnosis not present

## 2020-01-14 DIAGNOSIS — F419 Anxiety disorder, unspecified: Secondary | ICD-10-CM | POA: Diagnosis present

## 2020-01-14 DIAGNOSIS — Z85828 Personal history of other malignant neoplasm of skin: Secondary | ICD-10-CM | POA: Diagnosis not present

## 2020-01-14 DIAGNOSIS — Z79891 Long term (current) use of opiate analgesic: Secondary | ICD-10-CM | POA: Diagnosis not present

## 2020-01-14 LAB — BASIC METABOLIC PANEL
Anion gap: 11 (ref 5–15)
BUN: 42 mg/dL — ABNORMAL HIGH (ref 8–23)
CO2: 20 mmol/L — ABNORMAL LOW (ref 22–32)
Calcium: 8.6 mg/dL — ABNORMAL LOW (ref 8.9–10.3)
Chloride: 105 mmol/L (ref 98–111)
Creatinine, Ser: 2.41 mg/dL — ABNORMAL HIGH (ref 0.61–1.24)
GFR calc Af Amer: 27 mL/min — ABNORMAL LOW (ref >60)
GFR calc non Af Amer: 23 mL/min — ABNORMAL LOW (ref >60)
Glucose, Bld: 109 mg/dL — ABNORMAL HIGH (ref 70–99)
Potassium: 4.5 mmol/L (ref 3.5–5.1)
Sodium: 136 mmol/L (ref 135–145)

## 2020-01-14 LAB — CBC
HCT: 31.6 % — ABNORMAL LOW (ref 39.0–52.0)
Hemoglobin: 10.3 g/dL — ABNORMAL LOW (ref 13.0–17.0)
MCH: 32.3 pg (ref 26.0–34.0)
MCHC: 32.6 g/dL (ref 30.0–36.0)
MCV: 99.1 fL (ref 80.0–100.0)
Platelets: 213 10*3/uL (ref 150–400)
RBC: 3.19 MIL/uL — ABNORMAL LOW (ref 4.22–5.81)
RDW: 12.8 % (ref 11.5–15.5)
WBC: 6.9 10*3/uL (ref 4.0–10.5)
nRBC: 0 % (ref 0.0–0.2)

## 2020-01-14 LAB — STREP PNEUMONIAE URINARY ANTIGEN: Strep Pneumo Urinary Antigen: NEGATIVE

## 2020-01-14 LAB — MRSA PCR SCREENING: MRSA by PCR: NEGATIVE

## 2020-01-14 LAB — TROPONIN I (HIGH SENSITIVITY): Troponin I (High Sensitivity): 10 ng/L (ref <18)

## 2020-01-14 LAB — PROCALCITONIN: Procalcitonin: 0.12 ng/mL

## 2020-01-14 LAB — SARS CORONAVIRUS 2 BY RT PCR (HOSPITAL ORDER, PERFORMED IN ~~LOC~~ HOSPITAL LAB): SARS Coronavirus 2: NEGATIVE

## 2020-01-14 LAB — MAGNESIUM: Magnesium: 2.1 mg/dL (ref 1.7–2.4)

## 2020-01-14 MED ORDER — HEPARIN SODIUM (PORCINE) 5000 UNIT/ML IJ SOLN
5000.0000 [IU] | Freq: Three times a day (TID) | INTRAMUSCULAR | Status: DC
  Administered 2020-01-14: 5000 [IU] via SUBCUTANEOUS
  Filled 2020-01-14 (×2): qty 1

## 2020-01-14 MED ORDER — SODIUM CHLORIDE 0.9 % IV SOLN
500.0000 mg | INTRAVENOUS | Status: DC
  Administered 2020-01-14: 500 mg via INTRAVENOUS
  Filled 2020-01-14 (×2): qty 500

## 2020-01-14 MED ORDER — SODIUM CHLORIDE 0.9 % IV SOLN: INTRAVENOUS | Status: DC

## 2020-01-14 MED ORDER — SODIUM CHLORIDE 0.9 % IV SOLN
2.0000 g | INTRAVENOUS | Status: DC
  Administered 2020-01-14: 2 g via INTRAVENOUS
  Filled 2020-01-14: qty 20

## 2020-01-14 MED ORDER — POLYVINYL ALCOHOL 1.4 % OP SOLN
1.0000 [drp] | Freq: Three times a day (TID) | OPHTHALMIC | Status: DC
  Administered 2020-01-14 – 2020-01-15 (×3): 1 [drp] via OPHTHALMIC
  Filled 2020-01-14: qty 15

## 2020-01-14 MED ORDER — SODIUM CHLORIDE 0.9 % IV SOLN: 250.0000 mL | INTRAVENOUS | Status: DC | PRN

## 2020-01-14 MED ORDER — SODIUM CHLORIDE 0.9% FLUSH: 3.0000 mL | INTRAVENOUS | Status: DC | PRN

## 2020-01-14 MED ORDER — SODIUM CHLORIDE 0.9% FLUSH
3.0000 mL | Freq: Two times a day (BID) | INTRAVENOUS | Status: DC
  Administered 2020-01-14 – 2020-01-15 (×2): 3 mL via INTRAVENOUS

## 2020-01-14 MED ORDER — HEPARIN SODIUM (PORCINE) 5000 UNIT/ML IJ SOLN
5000.0000 [IU] | Freq: Three times a day (TID) | INTRAMUSCULAR | Status: DC
  Administered 2020-01-14 – 2020-01-15 (×3): 5000 [IU] via SUBCUTANEOUS
  Filled 2020-01-14 (×2): qty 1

## 2020-01-14 NOTE — ED Notes (Signed)
Per pt, "I do not walk". Pt is a High Fall Risk.

## 2020-01-14 NOTE — Progress Notes (Signed)
Received report from [facility] in the ED.

## 2020-01-14 NOTE — Progress Notes (Signed)
Patient is 86 year old male with history of hypertension, chronic pain syndrome, depression, anxiety, history of CVA, chronic kidney disease who presented from [facility] to the emergency  department for the evaluation of chest pain and cough.  He was complaining of 1 week history of productive cough, generalized malaise and chest discomfort.  On presentation he was mildly tachypneic but was overall hemodynamically stable.  Chest x-ray showed right base opacity.  CT chest revealed patchy opacity in the right middle and lower lobes.  Patient was admitted for the management of pneumonia.  Aspiration pneumonia suspected.  Speech therapy is following.  Plan for MBS tomorrow.   Patient seen and examined at the bedside this morning.  Currently looks comfortable.  He was on room air.  Auscultation revealed clear lungs.  We will continue current antibiotics for today.  Speech therapy planning for MBS tomorrow. His baseline creatinine is around 1.8.  His creatinine is in the range of 2 on presentation.  We will continue gentle IV fluids for today,we will get a BMP tomorow Patient seen by Dr. Myna Hidalgo this morning.  I agree with assessment and plan.
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
## 2020-01-14 NOTE — Progress Notes (Signed)
Patient transferred from ED to 5W26. Patient A&O to self at this time and is drowsy, but does answer questions. Telemetry monitoring on the room wall applied and second verified by this RN and Burt,NT. Skin assessment completed by this RN and Janet,RN. Callbell within reach. Patient sleeping in bed at this time. Will continue to monitor and treat per MD orders.
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
## 2020-01-15 ENCOUNTER — Inpatient Hospital Stay (HOSPITAL_COMMUNITY): Payer: Medicare Other

## 2020-01-15 DIAGNOSIS — J189 Pneumonia, unspecified organism: Secondary | ICD-10-CM | POA: Diagnosis not present

## 2020-01-15 LAB — PROCALCITONIN: Procalcitonin: <0.1 ng/mL

## 2020-01-15 LAB — BASIC METABOLIC PANEL
Anion gap: 9 (ref 5–15)
BUN: 32 mg/dL — ABNORMAL HIGH (ref 8–23)
CO2: 23 mmol/L (ref 22–32)
Calcium: 8.7 mg/dL — ABNORMAL LOW (ref 8.9–10.3)
Chloride: 105 mmol/L (ref 98–111)
Creatinine, Ser: 1.69 mg/dL — ABNORMAL HIGH (ref 0.61–1.24)
GFR calc Af Amer: 41 mL/min — ABNORMAL LOW (ref >60)
GFR calc non Af Amer: 36 mL/min — ABNORMAL LOW (ref >60)
Glucose, Bld: 105 mg/dL — ABNORMAL HIGH (ref 70–99)
Potassium: 4.5 mmol/L (ref 3.5–5.1)
Sodium: 137 mmol/L (ref 135–145)

## 2020-01-15 LAB — LEGIONELLA PNEUMOPHILA SEROGP 1 UR AG: L. pneumophila Serogp 1 Ur Ag: NEGATIVE

## 2020-01-15 MED ORDER — AMOXICILLIN-POT CLAVULANATE 875-125 MG PO TABS
1.0000 | ORAL_TABLET | Freq: Two times a day (BID) | ORAL | 0 refills | Status: AC
Start: 2020-01-15 — End: 2020-01-20

## 2020-01-15 MED FILL — AMOX-CLAV 875-125 MG TABLET: 875-125 | 5 days supply | Qty: 10 | Fill #0

## 2020-01-15 NOTE — Care Management Important Message (Addendum)
Important Message  Patient Details  Name: Zachary [name] MRN: [id number] Date of Birth: 11/13/1929   Medicare Important Message Given:  Yes   Patient being discharged today. Received a message patient wants to stay until tomorrow.   Spoke to patient at bedside. Explained and provided Important Message from Medicare.   Discussed with patient Attending MD has discharged him today. Explained appeal process, provided number.   Patient has decided to discharge home today and not appeal discharge.  Marilu Favre, RN 01/15/2020, 1:33 PM

## 2020-01-15 NOTE — TOC Initial Note (Addendum)
Transition of Care [facility]) - Initial/Assessment Note    Patient Details  Name: Zachary [name] MRN: [id number] Date of Birth: 1930/03/10  [facility]) CM/SW Contact:    Benard Halsted, LCSW Phone Number: 01/15/2020, 1:36 PM  Clinical Narrative:                 CSW notified that patient is ready to return to [facility] today. CSW spoke with patient's sister, [name]. She resides in [place] but reports agreement with plan. She requested CSW contact patient's nephew, Rogers Seeds [phone]) to make sure he wasn't available for transportation. [name] also provided her daughter's number if it is ever needed, Melissa [phone]). CSW spoke with [facility] but he is unable to transport patient today and requested PTAR. CSW faxed FL2 and DC Summary to [facility]; waiting on a call back.  Expected Discharge Plan: Assisted Living Barriers to Discharge: No Barriers Identified   Patient Goals and CMS Choice Patient states their goals for this hospitalization and ongoing recovery are:: Return home      Expected Discharge Plan and Services Expected Discharge Plan: Assisted Living In-house Referral: Clinical Social Work Discharge Planning Services: CM Consult Post Acute Care Choice: [place] arrangements for the past 2 months: [place] Expected Discharge Date: 01/15/20                                    Prior Living Arrangements/Services Living arrangements for the past 2 months: [place] Lives with:: Facility Resident Patient language and need for interpreter reviewed:: Yes Do you feel safe going back to the place where you live?: Yes      Need for Family Participation in Patient Care: Yes (Comment) Care giver support system in place?: Yes (comment)   Criminal Activity/Legal Involvement Pertinent to Current Situation/Hospitalization: No - Comment as needed  Activities of Daily Living Home Assistive  Devices/Equipment: None ADL Screening (condition at time of admission) Patient's cognitive ability adequate to safely complete daily activities?: No Is the patient deaf or have difficulty hearing?: Yes Does the patient have difficulty seeing, even when wearing glasses/contacts?: No Does the patient have difficulty concentrating, remembering, or making decisions?: Yes Patient able to express need for assistance with ADLs?: Yes Does the patient have difficulty dressing or bathing?: Yes Independently performs ADLs?: Yes (appropriate for developmental age) Does the patient have difficulty walking or climbing stairs?: Yes Weakness of Legs: Both Weakness of Arms/Hands: Left  Permission Sought/Granted Permission sought to share information with : Facility Sport and exercise psychologist, Family Supports Permission granted to share information with : Yes, Verbal Permission Granted  Share Information with NAME: [name]  Permission granted to share info w AGENCY: [name]  Permission granted to share info w Relationship: Sister  Permission granted to share info w Contact Information: [phone]  Emotional Assessment Appearance:: Appears stated age Attitude/Demeanor/Rapport: Guarded Affect (typically observed): Guarded Orientation: : Oriented to Self, Oriented to Place, Oriented to  Time, Oriented to Situation Alcohol / Substance Use: Not Applicable Psych Involvement: No (comment)  Admission diagnosis:  Bronchopneumonia [J18.0] Pneumonia [J18.9] Chronic kidney disease, unspecified CKD stage [N18.9] Patient Active Problem List   Diagnosis Date Noted  . CKD (chronic kidney disease), stage IV ([place]) 01/14/2020  . History of CVA (cerebrovascular accident) 01/14/2020  . Streptococcal bacteremia 03/04/2019  . Pneumonia 03/01/2019  . AKI (acute kidney injury) ([place]) 03/01/2019  . Cerebral thrombosis with cerebral infarction  12/17/2017  . Hyperlipidemia   . Vertigo 12/16/2017  . Dry eyes 05/01/2017   . Senile purpura ([place]) 07/18/2016  . Aortic atherosclerosis ([place]) 02/26/2015  . Allergic rhinitis 09/13/2013  . Osteoarthritis of left knee 03/07/2013  . Swelling of lower extremity 12/07/2012  . Gastritis 07/06/2012  . Obesity (BMI 30.0-34.9) 07/06/2012  . Squamous cell skin cancer 07/06/2012  . Healthcare maintenance 07/06/2012  . Constipation due to pain medication 06/03/2011  . Arthritis of hip 05/21/2010  . Dizziness 10/30/2006  . Anxiety 06/22/2006  . Major depressive disorder, recurrent severe without psychotic features ([place]) 06/22/2006  . Chronic pain disorder 06/22/2006  . Essential hypertension 06/22/2006  . Gastroesophageal reflux disease with stricture 06/22/2006  . Diverticulosis of colon 06/22/2006  . Benign prostatic hypertrophy with urinary obstruction 06/22/2006  . Facial basal cell cancer 06/22/2006   PCP:  Roetta Sessions, NP Pharmacy:   [facility] [place] [place] - [address] [place] [address] Phone: [phone] Fax: [phone]     Social Determinants of Health ([place]) Interventions    Readmission Risk Interventions Readmission Risk Prevention Plan 01/15/2020 03/15/2019  Transportation Screening Complete Complete  PCP or Specialist Appt within 3-5 Days Complete Not Complete  Not Complete comments - pt going back to ALF  [place] or [place] Complete Complete  Social Work Consult for [place] Planning/Counseling Complete Complete  Palliative Care Screening Not Applicable Not Applicable  Medication Review (RN Care Manager) Referral to Pharmacy Complete  Some recent data might be hidden

## 2020-01-15 NOTE — Evaluation (Signed)
Physical Therapy Evaluation Patient Details Name: Zachary [name] MRN: [id number] DOB: 1930-02-18 Today's Date: 01/15/2020   History of Present Illness  86 y.o. male with medical history significant for hypertension, chronic pain, depression, anxiety, history of CVA, and chronic kidney disease, now presenting to the emergency department for evaluation of chest discomfort and cough. Pt with RML and RLL PNA.  Clinical Impression   Pt presents with LE weakness, difficulty performing bed mobility tasks, low-normal O2 saturation during low-level mobility tasks ranging 90-94%, and decreased activity tolerance vs baseline. Pt to benefit from acute PT to address deficits. Pt tolerated bed-level assessment only, and required mod assist for bed mobility. Pt declined EOB or OOB activity today, states "come back tomorrow". Per pt, he ambulates with RW at baseline and is independent with ADLs. PT recommending SNF level of care post-acutely, pt from [facility]. PT to progress mobility as tolerated, and will continue to follow acutely.      Follow Up Recommendations SNF;Supervision/Assistance - 24 hour    Equipment Recommendations  None recommended by PT    Recommendations for Other Services       Precautions / Restrictions Precautions Precautions: Fall Restrictions Weight Bearing Restrictions: No      Mobility  Bed Mobility Overal bed mobility: Needs Assistance Bed Mobility: Supine to Sit     Supine to sit: Mod assist     General bed mobility comments: Mod assist for repositioning pt higher in bed with HOB flat and PT assist with bed pad incrimental scooting. Pt with use of UEs on bedrails and LEs in bridge position to assist with scooting up in bed.  Transfers                 General transfer comment: Pt refuses  Ambulation/Gait             General Gait Details: Pt refuses  Stairs            Wheelchair Mobility    Modified Rankin (Stroke Patients Only)        Balance Overall balance assessment: Needs assistance     Sitting balance - Comments: pt declines       Standing balance comment: pt declines                             Pertinent Vitals/Pain Pain Assessment: Faces Faces Pain Scale: Hurts a little bit Pain Location: chest pain Pain Descriptors / Indicators: Discomfort Pain Intervention(s): Limited activity within patient's tolerance;Monitored during session    Home Living Family/patient expects to be discharged to:: Assisted living               Home Equipment: Walker - 2 wheels;Cane - single point;Shower seat Additional Comments: [facility]    Prior Function Level of Independence: Needs assistance   Gait / Transfers Assistance Needed: Pt reports walking with RW at baseline, states he does not need physical assist for transfers/gait  ADL's / Homemaking Assistance Needed: reports independence with bathing and dressing, meals are provided        Hand Dominance   Dominant Hand: Right    Extremity/Trunk Assessment   Upper Extremity Assessment Upper Extremity Assessment: Generalized weakness    Lower Extremity Assessment Lower Extremity Assessment: Generalized weakness (pt able to perform ankle pumps, quad set, and hip and knee flexion/extension in supine, reports bilateral feet are tingling which is new)    Cervical / Trunk Assessment Cervical /  Trunk Assessment: Other exceptions Cervical / Trunk Exceptions: unable to assess, pt declines EOB or OOB  Communication   Communication: No difficulties  Cognition Arousal/Alertness: Awake/alert Behavior During Therapy: WFL for tasks assessed/performed;Flat affect Overall Cognitive Status: Within Functional Limits for tasks assessed                                 General Comments: Pt with flat affect, is irritable stating he missed his favorite meal of breakfast. Pt keeps stating "I don't feel like doing anything, I am  right with the Lord and if I go then that's okay", pt minimally participatory.      General Comments General comments (skin integrity, edema, etc.): HR stable and WFL during mobility, SpO2 90-94% on RA during bed mobility tasks    Exercises Other Exercises Other Exercises: PT encouraged OOB with RN staff for pulmonary health, as well as ankle pumps and heel slides for strength maintenance   Assessment/Plan    PT Assessment Patient needs continued PT services  PT Problem List Decreased strength;Decreased mobility;Decreased activity tolerance;Decreased balance;Decreased knowledge of use of DME;Pain;Cardiopulmonary status limiting activity;Decreased safety awareness       PT Treatment Interventions DME instruction;Therapeutic activities;Gait training;Patient/family education;Balance training;Therapeutic exercise;Neuromuscular re-education;Functional mobility training    PT Goals (Current goals can be found in the Care Plan section)  Acute Rehab PT Goals Patient Stated Goal: feel better PT Goal Formulation: With patient Time For Goal Achievement: 01/29/20 Potential to Achieve Goals: Good    Frequency Min 2X/week   Barriers to discharge        Co-evaluation               AM-PAC PT "6 Clicks" Mobility  Outcome Measure Help needed turning from your back to your side while in a flat bed without using bedrails?: A Lot Help needed moving from lying on your back to sitting on the side of a flat bed without using bedrails?: A Lot Help needed moving to and from a bed to a chair (including a wheelchair)?: Total Help needed standing up from a chair using your arms (e.g., wheelchair or bedside chair)?: Total Help needed to walk in hospital room?: Total Help needed climbing 3-5 steps with a railing? : Total 6 Click Score: 8    End of Session   Activity Tolerance: Patient limited by fatigue Patient left: in bed;with call bell/phone within reach;with bed alarm set Nurse  Communication: Mobility status PT Visit Diagnosis: Muscle weakness (generalized) (M62.81);Other abnormalities of gait and mobility (R26.89)    Time: 2446-2863 PT Time Calculation (min) (ACUTE ONLY): 12 min   Charges:   PT Evaluation $PT Eval Low Complexity: 1 Low          Yitzel Shasteen E, PT Acute Rehabilitation Services Pager [phone]  Office [phone]   Wendy Mikles D Elonda Husky 01/15/2020, 12:47 PM

## 2020-01-15 NOTE — Discharge Summary (Signed)
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 patient history, and epidemiological information.  Fact Sheet for Patients:   StrictlyIdeas.no  Fact Sheet for Healthcare Providers: BankingDealers.co.za  This test is not yet approved or  cleared by the [place] FDA and has been authorized for detection and/or diagnosis of SARS-CoV-2 by FDA under an Emergency Use Authorization (EUA).  This EUA will remain in effect (meaning this test can be used) for the duration of the COVID-19 declaration  under Section 564(b)(1) of the Act, 21 U.S.C. section 360bbb-3(b)(1), unless the authorization is terminated or revoked sooner.  Performed at [facility], [place] [address] [place], [place] [address]   MRSA PCR Screening     Status: None   Collection Time: 01/14/20  6:43 AM   Specimen: Nasal Mucosa; Nasopharyngeal  Result Value Ref Range Status   MRSA  by PCR NEGATIVE NEGATIVE Final    Comment:        The GeneXpert MRSA Assay (FDA approved for NASAL specimens only), is one component of a comprehensive MRSA colonization surveillance program. It is not intended to diagnose MRSA infection nor to guide or monitor treatment for MRSA infections. Performed at [facility], [place] [address], [place], [place] [address]     Please note: You were cared for by a hospitalist during your hospital stay. Once you are discharged, your primary care physician will handle any further medical issues. Please note that NO REFILLS for any discharge medications will be authorized once you are discharged, as it is imperative that you return to your primary care physician (or establish a relationship with a primary care physician if you do not have one) for your post hospital discharge needs so that they can reassess your need for medications and monitor your lab values.    Time coordinating discharge: 40 minutes  SIGNED:   Shelly Coss, MD  [facility] 01/15/2020, 11:45 AM Pager [phone]  If 7PM-7AM, please contact night-coverage [email or web address] Password [email or web address]

## 2020-01-15 NOTE — Progress Notes (Signed)
Modified Barium Swallow Progress Note  Patient Details  Name: Zachary [name] MRN: [id number] Date of Birth: Aug 18, 1929  Today's Date: 01/15/2020  Modified Barium Swallow completed.  Full report located under Chart Review in the Imaging Section.  Brief recommendations include the following:  Clinical Impression  Pt presents with mild oropharyngeal dysphagia, characterized orally by piecemeal swallow of puree and solid textures. Pharyngeal swallow is characterized by usual reflex trigger at the vallecular sinus across consistencies. Large/consecutive  boluses of thin liquid were noted to trigger at the pyriform sinus. Very trace flash penetration was seen only on large consecutive straw sips, and cleared completely without aspiration. Barium tablet cleared the oropharynx without difficulty or delay. Esophageal sweep revealed it to be clear. Recommend continuing regular solids and thin liquids with meds whole with liquid. Safe swallow precautions sent with transport back to pt room. No further ST intervention recommended at this time. Please reconsult if needs arise.   Swallow Evaluation Recommendations  SLP Diet Recommendations: Regular solids;Thin liquid   Liquid Administration via: Cup;Straw   Medication Administration: Whole meds with liquid   Supervision: Patient able to self feed   Compensations: Slow rate;Small sips/bites;Minimize environmental distractions   Postural Changes: Seated upright at 90 degrees   Oral Care Recommendations: Oral care BID   [name], [facility], [place] Speech Language Pathologist Office: [phone]  [name] 01/15/2020,11:11 AM

## 2020-01-15 NOTE — TOC Transition Note (Signed)
Transition of Care [facility]) - CM/SW Discharge Note   Patient Details  Name: Zachary [name] MRN: [id number] Date of Birth: [birth year]  [facility] CM/SW Contact:  Benard Halsted, LCSW Phone Number: 01/15/2020, 2:43 PM   Clinical Narrative:    Patient will DC to: [facility] Anticipated DC date: 01/15/20 Family notified: Sister, [name] Transport by: Corey Harold   Per MD patient ready for DC to ALF. RN, patient, patient's family, and facility notified of DC. Discharge Summary and FL2 sent to facility and approved by [facility]. RN to call report prior to discharge ([phone] option 1). DC packet on chart. Ambulance transport requested for patient.   CSW will sign off for now as social work intervention is no longer needed. Please consult [place] again if new needs arise.      Final next level of care: Assisted Living Barriers to Discharge: No Barriers Identified   Patient Goals and CMS Choice Patient states their goals for this hospitalization and ongoing recovery are:: Return home      Discharge Placement                Patient to be transferred to facility by: [place] Name of family member notified: Sister, [name] Patient and family notified of of transfer: 01/15/20  Discharge Plan and Services In-house Referral: Clinical Social Work Discharge Planning Services: CM Consult Post Acute Care Choice: Home Health                               Social Determinants of Health (SDOH) Interventions     Readmission Risk Interventions Readmission Risk Prevention Plan 01/15/2020 03/15/2019  Transportation Screening Complete Complete  PCP or Specialist Appt within 3-5 Days Complete Not Complete  Not Complete comments - pt going back to ALF  [place]RI or [place] Complete Complete  Social Work Consult for [place] Planning/Counseling Complete Complete  Palliative Care Screening Not Applicable Not Applicable  Medication Review Press photographer)  Referral to Pharmacy Complete  Some recent data might be hidden

## 2020-01-15 NOTE — NC FL2 (Addendum)
[place] LEVEL OF CARE SCREENING TOOL     IDENTIFICATION  Patient Name: Zachary [name] Birthdate: August 31, 1929 Sex: male Admission Date (Current Location): 01/13/2020  [facility] and [place] Number:  Herbalist and Address:  [facility] [place], [place] [address] [place], [place] [address]      Provider Number: [id number]  Attending Physician Name and Address:  Shelly Coss, MD  Relative Name and Phone Number:  [name] sister, [phone]    Current Level of Care: Hospital Recommended Level of Care: [place] Prior Approval Number:    Date Approved/Denied:   PASRR Number:    Discharge Plan: Other (Comment) (ALF)    Current Diagnoses: Patient Active Problem List   Diagnosis Date Noted  . CKD (chronic kidney disease), stage IV ([place]) 01/14/2020  . History of CVA (cerebrovascular accident) 01/14/2020  . Streptococcal bacteremia 03/04/2019  . Pneumonia 03/01/2019  . AKI (acute kidney injury) ([place]) 03/01/2019  . Cerebral thrombosis with cerebral infarction 12/17/2017  . Hyperlipidemia   . Vertigo 12/16/2017  . Dry eyes 05/01/2017  . Senile purpura ([place]) 07/18/2016  . Aortic atherosclerosis ([place]) 02/26/2015  . Allergic rhinitis 09/13/2013  . Osteoarthritis of left knee 03/07/2013  . Swelling of lower extremity 12/07/2012  . Gastritis 07/06/2012  . Obesity (BMI 30.0-34.9) 07/06/2012  . Squamous cell skin cancer 07/06/2012  . Healthcare maintenance 07/06/2012  . Constipation due to pain medication 06/03/2011  . Arthritis of hip 05/21/2010  . Dizziness 10/30/2006  . Anxiety 06/22/2006  . Major depressive disorder, recurrent severe without psychotic features ([place]) 06/22/2006  . Chronic pain disorder 06/22/2006  . Essential hypertension 06/22/2006  . Gastroesophageal reflux disease with stricture 06/22/2006  . Diverticulosis of colon 06/22/2006  . Benign prostatic hypertrophy with urinary obstruction 06/22/2006  .  Facial basal cell cancer 06/22/2006    Orientation RESPIRATION BLADDER Height & Weight     Self, Time, Situation, Place  Normal Continent Weight: (!) 205 lb 11 oz (93.3 kg) Height:  5\' 7"  (170.2 cm)  BEHAVIORAL SYMPTOMS/MOOD NEUROLOGICAL BOWEL NUTRITION STATUS      Continent Diet (Regular)  AMBULATORY STATUS COMMUNICATION OF NEEDS Skin   Limited Assist Verbally Normal                       Personal Care Assistance Level of Assistance  Bathing, Feeding, Dressing Bathing Assistance: Limited assistance Feeding assistance: Independent Dressing Assistance: Limited assistance     Functional Limitations Info  Sight, Hearing, Speech Sight Info: Adequate Hearing Info: Adequate Speech Info: Adequate    SPECIAL CARE FACTORS FREQUENCY   PT  Home Health 3x/week                  Contractures Contractures Info: Not present    Additional Factors Info  Psychotropic Code Status Info: Full Allergies Info: Clindamycin/lincomycin, Lincomycin Hcl Psychotropic Info: Ativan         Current Medications (01/15/2020):    Discharge Medications:  Medication List    STOP taking these medications   benzonatate 100 MG capsule Commonly known as: TESSALON   rosuvastatin 20 MG tablet Commonly known as: CRESTOR   Simethicone 125 MG Caps     TAKE these medications   acetaminophen 325 MG tablet Commonly known as: TYLENOL Take 650 mg by mouth every 6 (six) hours as needed for headache.   AmLactin 12 % lotion Generic drug: ammonium lactate Apply 1 application topically 2 (two) times daily as needed  for dry skin (apply to both legs and feet).   amLODipine 10 MG tablet Commonly known as: NORVASC Take 1 tablet (10 mg total) by mouth daily.   amoxicillin-clavulanate 875-125 MG tablet Commonly known as: Augmentin Take 1 tablet by mouth 2 (two) times daily for 5 days.   aspirin 81 MG EC tablet Take 1 tablet (81 mg total) by mouth daily. Swallow whole.    atorvastatin 40 MG tablet Commonly known as: LIPITOR Take 40 mg by mouth at bedtime.   Bandage Roll 3"x75" Misc Use as directed   clopidogrel 75 MG tablet Commonly known as: PLAVIX Take 1 tablet (75 mg total) by mouth daily.   diclofenac sodium 1 % Gel Commonly known as: VOLTAREN Apply 4 g topically 4 (four) times daily as needed.   feeding supplement Liqd Take 237 mLs by mouth 3 (three) times daily with meals.   ferrous gluconate 324 MG tablet Commonly known as: FERGON Take 1 tablet (324 mg total) by mouth daily with breakfast.   finasteride 5 MG tablet Commonly known as: Proscar Take 1 tablet (5 mg total) by mouth daily.   fluticasone 50 MCG/ACT nasal spray Commonly known as: FLONASE Place 2 sprays into both nostrils daily.   furosemide 40 MG tablet Commonly known as: LASIX Take 1 tablet (40 mg total) by mouth daily.   guaiFENesin 600 MG 12 hr tablet Commonly known as: MUCINEX Take 600 mg by mouth 2 (two) times daily. cough   levocetirizine 5 MG tablet Commonly known as: XYZAL Take 5 mg by mouth at bedtime.   lisinopril 20 MG tablet Commonly known as: ZESTRIL Take 1 tablet (20 mg total) by mouth daily.   LORazepam 0.5 MG tablet Commonly known as: ATIVAN Take 1 tablet (0.5 mg total) by mouth See admin instructions. Take one tablet (0.5 mg) by mouth three times daily, may also take one tablet (0.5 mg) every 12 hours as needed for anxiety   methadone 5 MG tablet Commonly known as: DOLOPHINE Take 1 tablet (5 mg total) by mouth every 8 (eight) hours.   mirtazapine 30 MG tablet Commonly known as: REMERON Take 30 mg by mouth at bedtime.   omeprazole 20 MG capsule Commonly known as: PRILOSEC Take 1 capsule (20 mg total) by mouth daily.   polyethylene glycol powder 17 GM/SCOOP powder Commonly known as: GLYCOLAX/MIRALAX Take 17 g by mouth daily.   Propylene Glycol 0.6 % Soln Place 1 drop into both eyes 3 (three) times daily.    risperiDONE 0.25 MG tablet Commonly known as: RISPERDAL Take 0.25 mg by mouth 2 (two) times daily.   senna 8.6 MG tablet Commonly known as: SENOKOT Take 3 tablets by mouth at bedtime.   sertraline 100 MG tablet Commonly known as: ZOLOFT Take 100 mg by mouth at bedtime. Taking with 50mg  tablet = 150mg  total What changed: Another medication with the same name was removed. Continue taking this medication, and follow the directions you see here.   Tab-A-Vite Tabs Take 1 tablet by mouth daily.   tamsulosin 0.4 MG Caps capsule Commonly known as: Flomax Take 1 capsule (0.4 mg total) by mouth daily.   wound dressings gel Apply topically as needed for wound care.     Relevant Imaging Results:  Relevant Lab Results:   Additional Information SS# [place], [place]

## 2020-01-15 NOTE — Progress Notes (Signed)
Zachary [name] to be D/C'd [facility]  per MD order.  Discussed with the patient and all questions fully answered.  VSS, Skin clean, dry and intact without evidence of skin break down, no evidence of skin tears noted. IV catheter discontinued intact. Site without signs and symptoms of complications. Dressing and pressure applied.  An After Visit Summary was printed and given to the patient. Patient received prescription.  D/c education completed with patient/family including follow up instructions, medication list, d/c activities limitations if indicated, with other d/c instructions as indicated by MD - patient able to verbalize understanding, all questions fully answered.   Patient instructed to return to ED, call 911, or call MD for any changes in condition.   Patient escorted via PTAR, and D/C to [facility] .  Report given to [place] from  [place] prior to D/C.    Lorenza Evangelist [facility] 01/15/2020 2:35 PM

## 2020-08-24 ENCOUNTER — Emergency Department (HOSPITAL_COMMUNITY): Payer: Medicare Other

## 2020-08-24 ENCOUNTER — Inpatient Hospital Stay (HOSPITAL_COMMUNITY)
Admission: EM | Admit: 2020-08-24 | Discharge: 2020-08-29 | DRG: 181 | Disposition: A | Discharge status: Hospice | Payer: Medicare Other | Source: Skilled Nursing Facility | Attending: Student in an Organized Health Care Education/Training Program | Admitting: Student in an Organized Health Care Education/Training Program

## 2020-08-24 DIAGNOSIS — C779 Secondary and unspecified malignant neoplasm of lymph node, unspecified: Secondary | ICD-10-CM | POA: Diagnosis present

## 2020-08-24 DIAGNOSIS — I129 Hypertensive chronic kidney disease with stage 1 through stage 4 chronic kidney disease, or unspecified chronic kidney disease: Secondary | ICD-10-CM | POA: Diagnosis present

## 2020-08-24 DIAGNOSIS — G894 Chronic pain syndrome: Secondary | ICD-10-CM | POA: Diagnosis present

## 2020-08-24 DIAGNOSIS — Z800 Family history of malignant neoplasm of digestive organs: Secondary | ICD-10-CM | POA: Diagnosis not present

## 2020-08-24 DIAGNOSIS — C3432 Malignant neoplasm of lower lobe, left bronchus or lung: Secondary | ICD-10-CM | POA: Diagnosis present

## 2020-08-24 DIAGNOSIS — N138 Other obstructive and reflux uropathy: Secondary | ICD-10-CM | POA: Diagnosis present

## 2020-08-24 DIAGNOSIS — G8929 Other chronic pain: Secondary | ICD-10-CM | POA: Diagnosis present

## 2020-08-24 DIAGNOSIS — Z85828 Personal history of other malignant neoplasm of skin: Secondary | ICD-10-CM | POA: Diagnosis not present

## 2020-08-24 DIAGNOSIS — C787 Secondary malignant neoplasm of liver and intrahepatic bile duct: Secondary | ICD-10-CM | POA: Diagnosis present

## 2020-08-24 DIAGNOSIS — I10 Essential (primary) hypertension: Secondary | ICD-10-CM | POA: Diagnosis present

## 2020-08-24 DIAGNOSIS — L853 Xerosis cutis: Secondary | ICD-10-CM | POA: Diagnosis present

## 2020-08-24 DIAGNOSIS — Z7982 Long term (current) use of aspirin: Secondary | ICD-10-CM

## 2020-08-24 DIAGNOSIS — Z20822 Contact with and (suspected) exposure to covid-19: Secondary | ICD-10-CM | POA: Diagnosis present

## 2020-08-24 DIAGNOSIS — N1832 Chronic kidney disease, stage 3b: Secondary | ICD-10-CM | POA: Diagnosis present

## 2020-08-24 DIAGNOSIS — Z66 Do not resuscitate: Secondary | ICD-10-CM | POA: Diagnosis present

## 2020-08-24 DIAGNOSIS — C7989 Secondary malignant neoplasm of other specified sites: Secondary | ICD-10-CM | POA: Diagnosis present

## 2020-08-24 DIAGNOSIS — C800 Disseminated malignant neoplasm, unspecified: Secondary | ICD-10-CM | POA: Diagnosis present

## 2020-08-24 DIAGNOSIS — Z515 Encounter for palliative care: Secondary | ICD-10-CM | POA: Diagnosis not present

## 2020-08-24 DIAGNOSIS — Z87891 Personal history of nicotine dependence: Secondary | ICD-10-CM | POA: Diagnosis not present

## 2020-08-24 DIAGNOSIS — R0902 Hypoxemia: Secondary | ICD-10-CM | POA: Diagnosis present

## 2020-08-24 DIAGNOSIS — E876 Hypokalemia: Secondary | ICD-10-CM | POA: Diagnosis present

## 2020-08-24 DIAGNOSIS — N401 Enlarged prostate with lower urinary tract symptoms: Secondary | ICD-10-CM | POA: Diagnosis present

## 2020-08-24 DIAGNOSIS — E785 Hyperlipidemia, unspecified: Secondary | ICD-10-CM | POA: Diagnosis present

## 2020-08-24 DIAGNOSIS — R918 Other nonspecific abnormal finding of lung field: Secondary | ICD-10-CM

## 2020-08-24 DIAGNOSIS — Z8673 Personal history of transient ischemic attack (TIA), and cerebral infarction without residual deficits: Secondary | ICD-10-CM

## 2020-08-24 DIAGNOSIS — Z79899 Other long term (current) drug therapy: Secondary | ICD-10-CM

## 2020-08-24 DIAGNOSIS — Z7902 Long term (current) use of antithrombotics/antiplatelets: Secondary | ICD-10-CM

## 2020-08-24 DIAGNOSIS — J449 Chronic obstructive pulmonary disease, unspecified: Secondary | ICD-10-CM | POA: Diagnosis present

## 2020-08-24 DIAGNOSIS — Z7189 Other specified counseling: Secondary | ICD-10-CM | POA: Diagnosis not present

## 2020-08-24 LAB — HEPATIC FUNCTION PANEL
ALT: 26 U/L (ref 0–44)
AST: 84 U/L — ABNORMAL HIGH (ref 15–41)
Albumin: 2.8 g/dL — ABNORMAL LOW (ref 3.5–5.0)
Alkaline Phosphatase: 109 U/L (ref 38–126)
Bilirubin, Direct: 0.3 mg/dL — ABNORMAL HIGH (ref 0.0–0.2)
Indirect Bilirubin: 0.6 mg/dL (ref 0.3–0.9)
Total Bilirubin: 0.9 mg/dL (ref 0.3–1.2)
Total Protein: 6.9 g/dL (ref 6.5–8.1)

## 2020-08-24 LAB — BASIC METABOLIC PANEL
Anion gap: 15 (ref 5–15)
BUN: 29 mg/dL — ABNORMAL HIGH (ref 8–23)
CO2: 22 mmol/L (ref 22–32)
Calcium: 8.6 mg/dL — ABNORMAL LOW (ref 8.9–10.3)
Chloride: 100 mmol/L (ref 98–111)
Creatinine, Ser: 2.1 mg/dL — ABNORMAL HIGH (ref 0.61–1.24)
GFR, Estimated: 30 mL/min — ABNORMAL LOW (ref >60)
Glucose, Bld: 106 mg/dL — ABNORMAL HIGH (ref 70–99)
Potassium: 3.2 mmol/L — ABNORMAL LOW (ref 3.5–5.1)
Sodium: 137 mmol/L (ref 135–145)

## 2020-08-24 LAB — LIPASE, BLOOD: Lipase: 25 U/L (ref 11–51)

## 2020-08-24 LAB — TROPONIN I (HIGH SENSITIVITY)
Troponin I (High Sensitivity): 30 ng/L — ABNORMAL HIGH (ref <18)
Troponin I (High Sensitivity): 29 ng/L — ABNORMAL HIGH (ref <18)

## 2020-08-24 LAB — I-STAT VENOUS BLOOD GAS, ED
Acid-Base Excess: 2 mmol/L (ref 0.0–2.0)
Bicarbonate: 28.6 mmol/L — ABNORMAL HIGH (ref 20.0–28.0)
Calcium, Ion: 1.14 mmol/L — ABNORMAL LOW (ref 1.15–1.40)
HCT: 36 % — ABNORMAL LOW (ref 39.0–52.0)
Hemoglobin: 12.2 g/dL — ABNORMAL LOW (ref 13.0–17.0)
O2 Saturation: 75 %
Potassium: 3 mmol/L — ABNORMAL LOW (ref 3.5–5.1)
Sodium: 140 mmol/L (ref 135–145)
TCO2: 30 mmol/L (ref 22–32)
pCO2, Ven: 55.4 mmHg (ref 44.0–60.0)
pH, Ven: 7.321 (ref 7.250–7.430)
pO2, Ven: 44 mmHg (ref 32.0–45.0)

## 2020-08-24 LAB — CBC
HCT: 35.8 % — ABNORMAL LOW (ref 39.0–52.0)
Hemoglobin: 11.8 g/dL — ABNORMAL LOW (ref 13.0–17.0)
MCH: 32.4 pg (ref 26.0–34.0)
MCHC: 33 g/dL (ref 30.0–36.0)
MCV: 98.4 fL (ref 80.0–100.0)
Platelets: 186 10*3/uL (ref 150–400)
RBC: 3.64 MIL/uL — ABNORMAL LOW (ref 4.22–5.81)
RDW: 14.2 % (ref 11.5–15.5)
WBC: 9 10*3/uL (ref 4.0–10.5)
nRBC: 0 % (ref 0.0–0.2)

## 2020-08-24 LAB — RESP PANEL BY RT-PCR (FLU A&B, COVID) ARPGX2
Influenza A by PCR: NEGATIVE
Influenza B by PCR: NEGATIVE
SARS Coronavirus 2 by RT PCR: NEGATIVE

## 2020-08-24 LAB — BRAIN NATRIURETIC PEPTIDE: B Natriuretic Peptide: 77.3 pg/mL (ref 0.0–100.0)

## 2020-08-24 MED ORDER — ALBUTEROL SULFATE HFA 108 (90 BASE) MCG/ACT IN AERS: 2.0000 | INHALATION_SPRAY | Freq: Once | RESPIRATORY_TRACT | Status: DC

## 2020-08-24 MED ORDER — IPRATROPIUM-ALBUTEROL 0.5-2.5 (3) MG/3ML IN SOLN
3.0000 mL | Freq: Once | RESPIRATORY_TRACT | Status: AC
  Administered 2020-08-24: 3 mL via RESPIRATORY_TRACT

## 2020-08-24 MED ORDER — FINASTERIDE 5 MG PO TABS
5.0000 mg | ORAL_TABLET | Freq: Every day | ORAL | Status: DC
  Administered 2020-08-24 – 2020-08-29 (×6): 5 mg via ORAL
  Filled 2020-08-24 (×7): qty 1

## 2020-08-24 MED ORDER — AMLODIPINE BESYLATE 10 MG PO TABS
10.0000 mg | ORAL_TABLET | Freq: Every day | ORAL | Status: DC
  Administered 2020-08-24 – 2020-08-29 (×6): 10 mg via ORAL
  Filled 2020-08-24: qty 1
  Filled 2020-08-24: qty 2
  Filled 2020-08-24 (×4): qty 1

## 2020-08-24 MED ORDER — IPRATROPIUM-ALBUTEROL 0.5-2.5 (3) MG/3ML IN SOLN: 3.0000 mL | RESPIRATORY_TRACT | Status: DC | PRN

## 2020-08-24 MED ORDER — LORAZEPAM 1 MG PO TABS: 0.5000 mg | ORAL_TABLET | Freq: Two times a day (BID) | ORAL | Status: DC | PRN

## 2020-08-24 MED ORDER — ALBUTEROL SULFATE (2.5 MG/3ML) 0.083% IN NEBU: 2.5000 mg | INHALATION_SOLUTION | Freq: Once | RESPIRATORY_TRACT | Status: DC

## 2020-08-24 MED ORDER — ENOXAPARIN SODIUM 40 MG/0.4ML ~~LOC~~ SOLN
40.0000 mg | SUBCUTANEOUS | Status: DC
  Administered 2020-08-24 – 2020-08-27 (×4): 40 mg via SUBCUTANEOUS
  Filled 2020-08-24 (×4): qty 0.4

## 2020-08-24 MED ORDER — METHYLPREDNISOLONE SODIUM SUCC 125 MG IJ SOLR
125.0000 mg | Freq: Once | INTRAMUSCULAR | Status: AC
  Administered 2020-08-24: 125 mg via INTRAVENOUS
  Filled 2020-08-24: qty 2

## 2020-08-24 MED ORDER — HYPROMELLOSE (GONIOSCOPIC) 2.5 % OP SOLN
1.0000 [drp] | Freq: Three times a day (TID) | OPHTHALMIC | Status: DC | PRN
  Administered 2020-08-26: 1 [drp] via OPHTHALMIC
  Filled 2020-08-24 (×2): qty 15

## 2020-08-24 MED ORDER — SENNA 8.6 MG PO TABS
17.2000 mg | ORAL_TABLET | Freq: Every day | ORAL | Status: DC
  Administered 2020-08-24 – 2020-08-28 (×5): 17.2 mg via ORAL
  Filled 2020-08-24 (×5): qty 2

## 2020-08-24 MED ORDER — ATORVASTATIN CALCIUM 40 MG PO TABS
40.0000 mg | ORAL_TABLET | Freq: Every day | ORAL | Status: DC
  Administered 2020-08-24 – 2020-08-27 (×4): 40 mg via ORAL
  Filled 2020-08-24 (×4): qty 1

## 2020-08-24 MED ORDER — POTASSIUM CHLORIDE CRYS ER 20 MEQ PO TBCR
40.0000 meq | EXTENDED_RELEASE_TABLET | Freq: Two times a day (BID) | ORAL | Status: AC
  Administered 2020-08-24 – 2020-08-25 (×2): 40 meq via ORAL
  Filled 2020-08-24 (×2): qty 2

## 2020-08-24 MED ORDER — AMMONIUM LACTATE 12 % EX LOTN
1.0000 "application " | TOPICAL_LOTION | Freq: Two times a day (BID) | CUTANEOUS | Status: DC | PRN
  Filled 2020-08-24: qty 225

## 2020-08-24 MED ORDER — CLOPIDOGREL BISULFATE 75 MG PO TABS
75.0000 mg | ORAL_TABLET | Freq: Every day | ORAL | Status: DC
  Administered 2020-08-24 – 2020-08-28 (×5): 75 mg via ORAL
  Filled 2020-08-24 (×5): qty 1

## 2020-08-24 MED ORDER — MORPHINE SULFATE (PF) 4 MG/ML IV SOLN
4.0000 mg | Freq: Once | INTRAVENOUS | Status: AC
  Administered 2020-08-24: 4 mg via INTRAVENOUS
  Filled 2020-08-24: qty 1

## 2020-08-24 MED ORDER — FUROSEMIDE 40 MG PO TABS
40.0000 mg | ORAL_TABLET | Freq: Every day | ORAL | Status: DC
  Administered 2020-08-25 – 2020-08-29 (×5): 40 mg via ORAL
  Filled 2020-08-24 (×5): qty 1

## 2020-08-24 MED ORDER — SERTRALINE HCL 50 MG PO TABS
150.0000 mg | ORAL_TABLET | Freq: Every day | ORAL | Status: DC
  Administered 2020-08-24 – 2020-08-28 (×5): 150 mg via ORAL
  Filled 2020-08-24 (×4): qty 1
  Filled 2020-08-24: qty 1.5

## 2020-08-24 MED ORDER — IPRATROPIUM BROMIDE HFA 17 MCG/ACT IN AERS: 2.0000 | INHALATION_SPRAY | Freq: Once | RESPIRATORY_TRACT | Status: DC

## 2020-08-24 MED ORDER — IPRATROPIUM-ALBUTEROL 0.5-2.5 (3) MG/3ML IN SOLN
RESPIRATORY_TRACT | Status: AC
  Administered 2020-08-24: 3 mL
  Filled 2020-08-24: qty 3

## 2020-08-24 NOTE — ED Notes (Signed)
Patient transported to X-ray 

## 2020-08-24 NOTE — ED Notes (Addendum)
Pt was found peeing in the garbage can by phlebotomist. I walk in pt was sitting at the edge of bed with no oxygen on. This tech put pt back into bed changed his gown and sheets and put warm blankets on. Also put oxygen on the pt and gave his call bell.

## 2020-08-24 NOTE — ED Triage Notes (Signed)
Pt from [facility] via EMS, facility reports shortness of breath and chest wall pain, worsening since Friday. Pt has productive cough, O2 sats in low 80s on facility assessment this morning, placed on 5L by EMS with improvement. On EMS arrival pt prone on the floor in his room, tried to sit in the chair and missed the chair. Denies head, neck, or back pain from fall.
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
## 2020-08-24 NOTE — Hospital Course (Addendum)
Presentation: SOB since Friday. Also has left-sided CP and upper abdominal pain of the left side. Nursing facility notes increased oxygen requirement. Found to be lying on the floor. Frail.   Seen in July 26 - July 28 for community acquired pneumonia, discharged to [facility] facility. Stayed there from July until now without records (care everywhere, chart review). Started without oxygen in the interim. Sat O2 from 90-92%.    PmHx: skin cancers, COPD  Imaging:  1) Chest CT: Dominant 4.3 cm left lower lobe mass. Additional bilateral pulmonary nodules measuring up to 2.3 cm. Differential considerations include left lower lobe primary bronchogenic neoplasm with bilateral pulmonary metastases versus metastatic disease from a nonvisualized primary.   Suspected thoracic nodal metastases, as above, including a 14 mm short axis right supraclavicular node which would be amenable to surgical excision/tissue sampling as clinically warranted.   Suspected right hepatic metastasis, poorly visualized.  2) Chest X-ray:  - Interval development of bilateral ill-defined nodular opacities, as described above. Correlation with chest CT is recommended to exclude the presence of multiple new pulmonary nodules. - Mild, stable right basilar scarring and/or atelectasis.  3)  Abdominal CT Pending   Labs:  1) mildly elevated Cr, possibly at baseline  2) troponin slightly elevated  3) LFT, Lipase  Txt: breathing treatments + steroids (seems to be improving)     STROKE: no acute infarction; small chronic infarcts within the left inferior cerebellum, right thalamus, and left lentiform nucleus and moderate chronic microvascular ischemic changes and parenchymal volume loss of the brain)

## 2020-08-24 NOTE — ED Notes (Signed)
Call Lenna Sciara [phone] for updates
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
## 2020-08-25 ENCOUNTER — Other Ambulatory Visit: Payer: Self-pay

## 2020-08-25 ENCOUNTER — Encounter (HOSPITAL_COMMUNITY): Payer: Self-pay | Admitting: Student in an Organized Health Care Education/Training Program

## 2020-08-25 DIAGNOSIS — C800 Disseminated malignant neoplasm, unspecified: Secondary | ICD-10-CM

## 2020-08-25 LAB — CBC
HCT: 35.5 % — ABNORMAL LOW (ref 39.0–52.0)
Hemoglobin: 11.9 g/dL — ABNORMAL LOW (ref 13.0–17.0)
MCH: 32.3 pg (ref 26.0–34.0)
MCHC: 33.5 g/dL (ref 30.0–36.0)
MCV: 96.5 fL (ref 80.0–100.0)
Platelets: 178 10*3/uL (ref 150–400)
RBC: 3.68 MIL/uL — ABNORMAL LOW (ref 4.22–5.81)
RDW: 13.7 % (ref 11.5–15.5)
WBC: 6.3 10*3/uL (ref 4.0–10.5)
nRBC: 0 % (ref 0.0–0.2)

## 2020-08-25 LAB — BASIC METABOLIC PANEL
Anion gap: 10 (ref 5–15)
BUN: 28 mg/dL — ABNORMAL HIGH (ref 8–23)
CO2: 27 mmol/L (ref 22–32)
Calcium: 9 mg/dL (ref 8.9–10.3)
Chloride: 102 mmol/L (ref 98–111)
Creatinine, Ser: 1.68 mg/dL — ABNORMAL HIGH (ref 0.61–1.24)
GFR, Estimated: 39 mL/min — ABNORMAL LOW (ref >60)
Glucose, Bld: 149 mg/dL — ABNORMAL HIGH (ref 70–99)
Potassium: 3.5 mmol/L (ref 3.5–5.1)
Sodium: 139 mmol/L (ref 135–145)

## 2020-08-25 MED ORDER — ORAL CARE MOUTH RINSE
15.0000 mL | Freq: Two times a day (BID) | OROMUCOSAL | Status: DC
  Administered 2020-08-25 – 2020-08-28 (×7): 15 mL via OROMUCOSAL

## 2020-08-25 MED ORDER — ENSURE ENLIVE PO LIQD
237.0000 mL | Freq: Two times a day (BID) | ORAL | Status: DC
  Administered 2020-08-25 – 2020-08-27 (×4): 237 mL via ORAL

## 2020-08-25 NOTE — Plan of Care (Signed)

## 2020-08-25 NOTE — Progress Notes (Signed)
Subjective: This is hospital day #2 for Zachary [name], an 87 y.o. man with a past medical history of hypertension, cerebral infarction, and chronic kidney disease stage IIIb who presented with shortness of breath and dizziness. Found to have tumors of the lungs, liver, and lymph nodes on CT and admitted for further evaluation.   No acute events overnight.   This morning, patient reports feeling more comfortable today. He continues to endorse understanding that he likely has lung cancer. He is not interested in further testing or treatment at this time. He recognizes that this process would be hard on his body. Would like to maximize his quality of life in the hopes of being to continue pursuing his interests, including watching the squirrels and birds near his nursing facility. States that he would like to discuss the diagnosis with his sister, who is his only source of support but lives in [place].    Objective:  Vital signs in last 24 hours: Vitals:   08/25/20 0500 08/25/20 0624 08/25/20 0748 08/25/20 1200  BP: 140/70 (!) 150/67 (!) 143/59 (!) 142/59  Pulse: 71 88 80 87  Resp: 18 19 18  (!) 31  Temp:  97.6 F (36.4 C) 97.8 F (36.6 C) 98.3 F (36.8 C)  TempSrc:  Oral Axillary Oral  SpO2: 92%  93% 97% on 3L Zachary [name]   Weight:  84 kg    Height:  5\' 6"  (1.676 m)     Physical Exam: General: Alert, awake, no acute distress, converstational Lungs: Normal pulmonary effort Cardiovascular: Regular rate, rhythm; no murmurs/rubs/gallops  Extremities: No edema   Intake/Output Summary (Last 24 hours) at 08/25/2020 1353 Last data filed at 08/25/2020 1238 Gross per 24 hour  Intake 100 ml  Output 250 ml  Net -150 ml   Labs in Last 24 Hours:  CBC Latest Ref Rng & Units 08/25/2020 08/24/2020 08/24/2020  WBC 4.0 - 10.5 K/uL 6.3 - 9.0  Hemoglobin 13.0 - 17.0 g/dL 11.9(L) 12.2(L) 11.8(L)  Hematocrit 39.0 - 52.0 % 35.5(L) 36.0(L) 35.8(L)  Platelets 150 - 400 K/uL 178 - 186  MCV: 96.5 RDW:  13.7  BMP Latest Ref Rng & Units 08/25/2020 08/24/2020 08/24/2020  Glucose 70 - 99 mg/dL 149(H) - 106(H)  BUN 8 - 23 mg/dL 28(H) - 29(H)  Creatinine 0.61 - 1.24 mg/dL 1.68(H) - 2.10(H)  BUN/Creat Ratio 10 - 24 - - -  Sodium 135 - 145 mmol/L 139 140 137  Potassium 3.5 - 5.1 mmol/L 3.5 3.0(L) 3.2(L)  Chloride 98 - 111 mmol/L 102 - 100  CO2 22 - 32 mmol/L 27 - 22  Calcium 8.9 - 10.3 mg/dL 9.0 - 8.6(L)    Assessment/Plan: In summary, Zachary [name] is an 87 y.o. man with a past medical history of hypertension, cerebral infarction, and chronic kidney disease stage IIIb who presented with shortness of breath and dizziness. Found to have diffuse lung tumors and admitted for evaluation.   Principal Problem:   Disseminated cancer ([place]) Active Problems:   Chronic pain disorder   Essential hypertension  Disseminated Cancer  Chronic Pain  Patient likely has stage IV primary lung malignancy with metastasis to liver, lymph nodes. Reports feeling better today with improved breathing. Discussed pursuing aggressive treatment or comfort care with patient. At this time, patient does not wish to pursue aggressive work-up and treatment, which would include likely excisional biopsy followed by chemotherapy and radiation. Interested in palliative care to maximize remaining quality of life. Our team discussed with patient's family  who agrees that maximizing patient's comfort should be primary goal.    - Consult palliative care team to maximize patient comfort and pain management, especially in setting of history of chronic pain previously requiring methadone - Continue to recommend hospice care at patient's assisted living facility   Essential Hypertension  Blood pressure mildly elevated at 142/52 during hospitilization.  - Continue home regimen of amlodipine 10 mg daily - Monitor daily blood pressures    Dry Skin, Bilateral Lower Extremities  Patient has dry, coarse skin of lower extremities. Possibly due to  chronic venous insufficiency.    - Apply Ammonium Lactate 12% Lotion BID PRN to lower extremities    #VTE ppx: Enoxaparin 40mg  daily #Bowel regimen:Senna 17.2 mg tablet, daily #Code status: DNR #Diet:Regular   LOS: 1 day   Gwynneth Albright, Medical Student 08/25/2020, 1:53 PM

## 2020-08-25 NOTE — Progress Notes (Signed)
Initial Nutrition Assessment  DOCUMENTATION CODES:   Not applicable  INTERVENTION:  Provide Ensure Enlive po BID, each supplement provides 350 kcal and 20 grams of protein  Encourage adequate PO intake.   NUTRITION DIAGNOSIS:   Increased nutrient needs related to chronic illness (cancer) as evidenced by estimated needs.  GOAL:   Patient will meet greater than or equal to 90% of their needs  MONITOR:   PO intake,Supplement acceptance,Weight trends,Skin,I & O's,Labs  REASON FOR ASSESSMENT:   Malnutrition Screening Tool    ASSESSMENT:   87 year old person living at the [facility] assisted living facility with a history of hypertension, cerebral infarction, chronic kidney disease stage IIIb, admitted for shortness of breath and dizziness. Work up revealed revealed diffuse tumors throughout his lung, lymph nodes, and liver most consistent with new metastatic malignancy. Pt with no interest in further testing or treatment at this time. Palliative care has been consulted for goals of care.   Pt reports appetite is fine, however did not eat much at breakfast this morning. Pt reports only eating his eggs on meal tray, the remaining foods did not look appetizing to him at the time. Pt awaiting on lunch at time of visit. Pt reports usually consuming 3 meals a day with breakfast consisting of a full large meal, lunch consisting of only a dessert item and coffee and dinner consisting of a grilled cheese sandwich and soup. Pt with a 8% weight loss in 8 months, not significant for time frame. RD to order nutritional supplements to aid in caloric and protein needs.   NUTRITION - FOCUSED PHYSICAL EXAM:  Flowsheet Row Most Recent Value  Orbital Region Moderate depletion  Upper Arm Region Unable to assess  Thoracic and Lumbar Region No depletion  Buccal Region No depletion  Temple Region Unable to assess  Clavicle Bone Region No depletion  Clavicle and Acromion Bone Region No depletion   Scapular Bone Region Unable to assess  Dorsal Hand Unable to assess  Patellar Region No depletion  Anterior Thigh Region No depletion  Posterior Calf Region No depletion  Edema (RD Assessment) None  Hair Reviewed  Eyes Reviewed  Mouth Reviewed  Skin Reviewed  Nails Reviewed     Labs and medications reviewed.   Diet Order:   Diet Order            Diet regular Room service appropriate? Yes; Fluid consistency: Thin  Diet effective now                 EDUCATION NEEDS:   Not appropriate for education at this time  Skin:  Skin Assessment: Reviewed RN Assessment  Last BM:  Unknown  Height:   Ht Readings from Last 1 Encounters:  08/25/20 5\' 6"  (1.676 m)    Weight:   Wt Readings from Last 1 Encounters:  08/25/20 84 kg   BMI:  Body mass index is 29.89 kg/m.  Estimated Nutritional Needs:   Kcal:  0459-9774  Protein:  80-95 grams  Fluid:  >/= 1.8 L/day  Corrin Parker, MS, RD, LDN RD pager number/after hours weekend pager number on Amion.

## 2020-08-26 ENCOUNTER — Encounter (HOSPITAL_COMMUNITY): Payer: Self-pay | Admitting: Student in an Organized Health Care Education/Training Program

## 2020-08-26 DIAGNOSIS — Z515 Encounter for palliative care: Secondary | ICD-10-CM

## 2020-08-26 DIAGNOSIS — Z7189 Other specified counseling: Secondary | ICD-10-CM

## 2020-08-26 MED ORDER — ONDANSETRON HCL 4 MG PO TABS: 4.0000 mg | ORAL_TABLET | Freq: Three times a day (TID) | ORAL | Status: DC | PRN

## 2020-08-26 MED ORDER — LIP MEDEX EX OINT
TOPICAL_OINTMENT | Freq: Three times a day (TID) | CUTANEOUS | Status: DC
  Filled 2020-08-26: qty 7

## 2020-08-26 MED ORDER — OXYCODONE HCL 5 MG PO TABS
2.5000 mg | ORAL_TABLET | Freq: Four times a day (QID) | ORAL | Status: DC | PRN
  Administered 2020-08-27 (×2): 2.5 mg via ORAL
  Filled 2020-08-26 (×2): qty 1

## 2020-08-26 MED ORDER — ACETAMINOPHEN 325 MG PO TABS
650.0000 mg | ORAL_TABLET | Freq: Four times a day (QID) | ORAL | Status: DC | PRN
  Administered 2020-08-26 – 2020-08-28 (×4): 650 mg via ORAL
  Filled 2020-08-26 (×5): qty 2

## 2020-08-26 MED ORDER — HYPROMELLOSE (GONIOSCOPIC) 2.5 % OP SOLN: 1.0000 [drp] | Freq: Three times a day (TID) | OPHTHALMIC | Status: DC

## 2020-08-26 MED ORDER — POLYVINYL ALCOHOL 1.4 % OP SOLN
1.0000 [drp] | Freq: Three times a day (TID) | OPHTHALMIC | Status: DC
  Administered 2020-08-26 (×3): 1 [drp] via OPHTHALMIC
  Filled 2020-08-26: qty 15

## 2020-08-26 NOTE — Plan of Care (Signed)

## 2020-08-26 NOTE — Consult Note (Addendum)
Consultation Note Date: 08/26/2020   Patient Name: Zachary [name]  DOB: [birth year]  MRN: [id number]  Age / Sex: 87 y.o., male  PCP: Roetta Sessions, NP Referring Physician: Axel Filler, *  Reason for Consultation: Establishing goals of care and Hospice Evaluation  HPI/Patient Profile: 87 y.o. male  with past medical history of stroke, hypertension, skin cancer, chronic tobacco and alcohol use admitted on 08/24/2020 from [facility] with shortness of breath and dizziness and multiple lung nodules noted on CT chest. Concern for potential mets to liver and kidneys. Chronic pain and declining functional status. He has expressed to primary team no desire to pursue further work up and treatment. Palliative care consulted to discuss goals further with consideration of hospice services.   Clinical Assessment and Goals of Care: I met today at Zachary [name] bedside. Zachary [name] is awake and oriented. He has good understanding of his condition and that he likely has advanced cancer. Zachary [name] is very clear and also at peace with his wishes. He tells me that he has lived a very long life and a very good life. He feels blessed and is not interested in pursuing work up or treatment. He wishes to focus on quality of life and comfort and is ready "when God takes me."   We discussed plan to move forward. His main joy is watching the birds and squirrels outside his window of his room at [facility]. However, he also complains of the care he receives there. We discussed the addition of hospice to ensure his comfort and provide him with additional care and support at [facility]. He believes this is a good idea and he wishes to discuss this further with his sister, Jacqlyn Larsen [name]), before making a final decision. He will also have Jacqlyn Larsen and his friend, Dot, call me as well.   I spoke with both Becky and Dot (separately) regarding  Zachary [name] diagnosis and prognosis. They both support and understand his decision not to pursue work up and treatment. Both also supportive of addition of hospice support. Jacqlyn Larsen asks about assistance with POA and Living Will (but more so for his finances and not healthcare which I explained is difficult to get done in the hospital and will see if hospice has ideas to help get this done when he returns to [place]).   All questions/concerns addressed. Emotional support provided.   Primary Decision Maker PATIENT    SUMMARY OF RECOMMENDATIONS   - Plan for comfort focused care with help from hospice at facility - Hope to complete MOST form with him tomorrow after he has a chance to speak with his family  Code Status/Advance Care Planning:  DNR   Symptom Management:   Scheduled eye drops and lip balm to assist with dryness.   OxyIR 2.5 mg every 6 hours as needed for pain. Unclear his current regimen and if he is still receiving methadone. Will try and verify in the morning.   Palliative Prophylaxis:   Aspiration, Bowel Regimen, Delirium Protocol, Frequent Pain Assessment,  Oral Care and Turn Reposition  Psycho-social/Spiritual:   Desire for further Chaplaincy support:yes  Additional Recommendations: Education on Hospice and Grief/Bereavement Support  Prognosis:   < 6 months  Discharge Planning: Return to [place] with hospice in place.      Primary Diagnoses: Present on Admission: . Disseminated cancer (HCC) . Chronic pain disorder . Essential hypertension   I have reviewed the medical record, interviewed the patient and family, and examined the patient. The following aspects are pertinent.  Past Medical History:  Diagnosis Date  . Alcoholism (HCC)    Remote  . Anxiety 06/22/2006  . Aortic atherosclerosis (HCC) 02/26/2015   Seen on CT scan, currently asymptomatic  . Arthritis of hip 05/21/2010   s/p left hip total arthroplasty   . BCC (basal cell carcinoma of skin)  06/05/2003   LEFT CHEEK BONE CX3 =EXC  . BCC (basal cell carcinoma of skin) 02/27/2004   RIGHT FOREARM TX WITH BX, CX3 5FU, EXC  . BCC (basal cell carcinoma of skin) 02/03/2005   UNDER LEFT EYE TX MOHS  . BCC (basal cell carcinoma of skin) 10/05/2006   LEFT CHEEK BONE   . BCC (basal cell carcinoma of skin) 09/01/2010    SIDEBURN BCC  . BCC (basal cell carcinoma of skin) 08/29/2010   RIGHT ZYGOMA   . BCC (basal cell carcinoma of skin) 02/27/2012   RIGHT FOREHEAD BCC TX WITH BX  . BCC (basal cell carcinoma of skin) 12/10/2013   LEFT INNER FOREARM  TX WITH BX  . Benign prostatic hypertrophy with urinary obstruction 06/22/2006  . Cataract   . Cerebral thrombosis with cerebral infarction 12/17/2017  . Chronic kidney disease, stage 3 (HCC) 11/08/2013  . Chronic pain disorder 06/22/2006  . Constipation due to pain medication 06/03/2011  . Diverticulosis of colon 06/22/2006  . Essential hypertension 06/22/2006  . Gastritis 07/06/2012   Seen on EGD 08/02/2006   . Gastroesophageal reflux disease with stricture 06/22/2006   With esophagitis, requiring dilatation 08/02/2006   . Hyperlipidemia   . Irritable bowel syndrome   . Major depression 06/22/2006  . Obesity (BMI 30.0-34.9) 07/06/2012  . Osteoarthritis of left knee 03/07/2013  . Pneumonia 02/2019  . SCC (squamous cell carcinoma) 06/05/2003   LEFT OUTER WRIST TX CX3 5FU  . SCC (squamous cell carcinoma) 06/05/2003   RIGHT HAND RIGHT TEMPLE TX CX3 5FU  . SCC (squamous cell carcinoma) 06/05/2003   RIGHT TEMPLE TX CX3 5FU  . SCC (squamous cell carcinoma) 09/02/2003   LEFT HAND TX CX3 5FU  . SCC (squamous cell carcinoma) 02/27/2004   RIGHT UPPER ARM CX3 5FU  . SCC (squamous cell carcinoma) 02/27/2004   RIGHT ELBOW TX CX3 5FU  . SCC (squamous cell carcinoma) 02/27/2004   RIGHT OUTER FOREARM TX CX3 5FU  . SCC (squamous cell carcinoma) 02/27/2004   RIGHT INNER FOREARM TX CX3 5FU  . SCC (squamous cell carcinoma) 05/11/2004   BOWENS TIP OF NOSE TX CX3  CAUTERY  . SCC (squamous cell carcinoma) 02/03/2005   RIGHT OUTER HEEK BONE TX WITH BX  . SCC (squamous cell carcinoma) 02/03/2005   RIGHT MID CHEEK SUP TX WITH BX  . SCC (squamous cell carcinoma) 02/03/2005   RIGHT MID CHEEK INF. TX WITH BX  . SCC (squamous cell carcinoma) 02/03/2005   LEFT TIP OF NOSE TX WITH BX  . SCC (squamous cell carcinoma) 02/03/2005   LEFT MID CHEEK TX WITH BX   . SCC (squamous cell carcinoma) 05/31/2005   LOWER RIGHT FOREARM  TX WITH BX  . SCC (squamous cell carcinoma) 05/31/2005   RIGHT WRIST TX CX3 5FU  . SCC (squamous cell carcinoma) 12/06/2005   RIGHT HAND TX WITH BX  . SCC (squamous cell carcinoma) 12/06/2005   RIGHT ARM WRIST, INF TX WITH BX  . SCC (squamous cell carcinoma) 12/06/2005   RIGHT INDEX FINGER TX WITH BX  . SCC (squamous cell carcinoma) 12/06/2005   RIGHT ARM WRIST SUP. TX WITH BX  . SCC (squamous cell carcinoma) 12/06/2005   FINGER WEB TX WITH BX  . SCC (squamous cell carcinoma) 12/06/2005   RIGHT SIDE RIGHT HAND TX WITH BX  . SCC (squamous cell carcinoma) 07/23/2007   LEFT FOREHEAD  . SCC (squamous cell carcinoma) 07/23/2007   RIGHT FOREHEAD, INF  . SCC (squamous cell carcinoma) 07/23/2007   RIGHT HAND THUMB TX WITH BX  . SCC (squamous cell carcinoma) 07/23/2007   LEFT LATERAL WRIST TX WITH BX  . SCC (squamous cell carcinoma) 04/21/2008   CENTER POST SCALP TX CX3 5FU  . SCC (squamous cell carcinoma) 04/21/2008   LEFT SCALP POST TX CX3 5FU EXC  . SCC (squamous cell carcinoma) 09/08/2008   LEFT EAR TX WITH BX  . SCC (squamous cell carcinoma) 09/08/2008   LEFT NECK TX WITH BX  . SCC (squamous cell carcinoma) 09/08/2008   LEFT FOREHEAD TX WITH BX  . SCC (squamous cell carcinoma) 09/08/2008   BELOW RIGHT EYE TX WITH BX  . SCC (squamous cell carcinoma) 09/29/2008   RIGHT WRIST TX WITH BX  . SCC (squamous cell carcinoma) 09/01/2010   LEFT SCALP TX CX3 5FU   . SCC (squamous cell carcinoma) 02/02/2011   LEFT HAND BASE OF THUMB TX  WITH BX  . SCC (squamous cell carcinoma) 02/02/2011   LEFT HAND MEDIAL TX WITH BX  . SCC (squamous cell carcinoma) 02/27/2012   LEFT SCALP TX WX WITH BX  . SCC (squamous cell carcinoma) 12/10/2013   RIGHT TEMPLE BOWENS TX MOHS  . SCC (squamous cell carcinoma) 12/10/2013   RIGHT WRIST TX WITH BX  . SCC (squamous cell carcinoma) 12/10/2013   RIGHT HAND MEDIAL TX WITH BX  . SCC (squamous cell carcinoma) 12/10/2013   RIGHT HAND DISTAL TX WITH BX  . SCC (squamous cell carcinoma) 12/10/2013   RIGHT HAND PROXIMAL TX WITH BX  . SCC (squamous cell carcinoma) 12/10/2013   RIGHT HAND TX WITH BX  . SCCA (squamous cell carcinoma) of skin 11/26/2019   Left Ala Nasi (in situ)  . SCCA (squamous cell carcinoma) of skin 11/26/2019   Left Elbow - Posterior (Keratoacanthoma)  . Squamous cell skin cancer 06/05/2003   SCC LEFT SIDE OF SCALP TX CX3 5FU  . Swelling of left lower extremity 12/07/2012   Responds to lasix 40 mg daily      Social History   Socioeconomic History  . Marital status: Widowed    Spouse name: Not on file  . Number of children: Not on file  . Years of education: Not on file  . Highest education level: Not on file  Occupational History  . Not on file  Tobacco Use  . Smoking status: Former Smoker    Packs/day: 1.00    Years: 25.00    Pack years: 25.00    Quit date: 06/20/1978    Years since quitting: 42.2  . Smokeless tobacco: Never Used  Vaping Use  . Vaping Use: Never used  Substance and Sexual Activity  . Alcohol use: No  . Drug  use: No  . Sexual activity: Not on file  Other Topics Concern  . Not on file  Social History Narrative   Zachary [name] has been divorced for decades. He has 2 adult children probably in [place] with no contact.  Had a sig other who died years.  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  Maintains some contact with his sister and some nephews, but seems to have  a lonely existence.  He lives in an assisting living facility in [place].  History has included smoking and periods of drinking (details of this are unclear)   Social Determinants of Health   Financial Resource Strain: Not on file  Food Insecurity: Not on file  Transportation Needs: Not on file  Physical Activity: Not on file  Stress: Not on file  Social Connections: Not on file   Family History  Problem Relation Age of Onset  . Pneumonia Mother 103  . Heart attack Father 37  . Early death Brother        Specifics unknown  . Healthy Daughter   . Healthy Son   . Heart disease Brother        Specifics unknown  . Pancreatic cancer Sister   . Early death Sister 3       Hit by truck on [address]  . Healthy Daughter    Scheduled Meds: . amLODipine  10 mg Oral Daily  . atorvastatin  40 mg Oral QHS  . clopidogrel  75 mg Oral Daily  . enoxaparin (LOVENOX) injection  40 mg Subcutaneous Q24H  . feeding supplement  237 mL Oral BID BM  . finasteride  5 mg Oral Daily  . furosemide  40 mg Oral Daily  . mouth rinse  15 mL Mouth Rinse BID  . senna  17.2 mg Oral QHS  . sertraline  150 mg Oral QHS   Continuous Infusions: PRN Meds:.ammonium lactate, hydroxypropyl methylcellulose / hypromellose, ipratropium-albuterol Allergies  Allergen Reactions  . Clindamycin/Lincomycin Rash and Other (See Comments)    "Allergic," per MAR  . Lincomycin Hcl Rash and Other (See Comments)    "Allergic," per MAR   Review of Systems  Constitutional: Positive for activity change, appetite change and fatigue.  Respiratory: Positive for shortness of breath.   Neurological: Positive for weakness.    Physical Exam Vitals and nursing note reviewed.  Constitutional:      General: He is not in acute distress.    Appearance: He is ill-appearing.  Cardiovascular:     Rate and Rhythm: Normal rate.  Pulmonary:     Effort: No tachypnea, accessory muscle usage or respiratory distress.     Comments:  Shortness of breath improved with oxygen support Abdominal:     Palpations: Abdomen is soft.  Neurological:     Mental Status: He is alert and oriented to person, place, and time.     Vital Signs: BP 119/70 (BP Location: Right Arm)   Pulse 70   Temp 98.3 F (36.8 C) (Axillary)   Resp 18   Ht $R'5\' 6"'TT$  (1.676 m)   Wt 84 kg   SpO2 94%   BMI 29.89 kg/m  Pain Scale: 0-10   Pain Score: 0-No pain   SpO2: SpO2: 94 % O2 Device:SpO2: 94 % O2 Flow Rate: .O2 Flow Rate (L/min): 3 L/min  IO: Intake/output summary:   Intake/Output Summary (Last 24 hours) at 08/26/2020 9417 Last data  filed at 08/26/2020 0500 Gross per 24 hour  Intake 220 ml  Output 650 ml  Net -430 ml    LBM:   Baseline Weight: Weight: 84 kg Most recent weight: Weight: 84 kg     Palliative Assessment/Data:     Time In/Out: 1898-4210, 1600-1630 Time Total: 70 min Greater than 50%  of this time was spent counseling and coordinating care related to the above assessment and plan.  Signed by: Vinie Sill, NP Palliative Medicine Team Pager # [phone] (M-F 8a-5p) Team Phone # [phone] (Nights/Weekends)

## 2020-08-26 NOTE — Evaluation (Signed)
Physical Therapy Evaluation Patient Details Name: Zachary [name] MRN: [id number] DOB: 22-Jun-1929 Today's Date: 08/26/2020   History of Present Illness  87 y.o. man  who presented with shortness of breath and dizziness after a fall at his ALF.  Found to have tumors of the lungs, liver, and lymph nodes on CT. past medical history of hypertension, cerebral infarction, chronic kidney disease stage IIIb, and chronic tobacco  Clinical Impression  PTA pt living at [facility], he reports until last week or so he was getting around his room independently with RW and was able to do his own self care.  Facility provided meals and other iADLs. Pt does report multiple near falls in the last few weeks and actual fall prior to hospitalization. Pt is currently severely limited in safe mobility by sharp, shooting chest pain with coughing and moving. Pt requires modA for coming EoB and for coming into standing for a few seconds before needing to sit due to pain. Pt reports trying to wrap his head around his diagnosis and is not sure what he wants other than he does not think that his needs will be met at [facility]. Pt is definitely concerned about the pain and it limiting his ability to care for himself. PT recommending Hospice SNF level care at discharge. With pain management possible pt mobility can improve. PT will continue to progress mobility acutely.    Follow Up Recommendations Other (comment) (Hospice/[facility])    Equipment Recommendations  None recommended by PT    Recommendations for Other Services       Precautions / Restrictions Precautions Precautions: Fall Restrictions Weight Bearing Restrictions: No      Mobility  Bed Mobility Overal bed mobility: Needs Assistance Bed Mobility: Supine to Sit;Sit to Supine     Supine to sit: HOB elevated;Mod assist Sit to supine: Total assist;+2 for physical assistance   General bed mobility comments: pt able to manage LE off bed but needs  assistance to bring opposite hand to bed rail, pt with extreme pain with trunkal rotation and needs increased assistance for bringing trunk to upright and for pad scoot to EoB. total A to return to bed due to pain    Transfers Overall transfer level: Needs assistance Equipment used: Rolling walker (2 wheeled) Transfers: Sit to/from Stand Sit to Stand: Mod assist         General transfer comment: good power up however requires modA for steadying due to increased pain, could not tolerate standing due to pain and returned to seated  Ambulation/Gait             General Gait Details: unable to progress due to pain        Balance Overall balance assessment: Needs assistance Sitting-balance support: Feet supported;No upper extremity supported;Bilateral upper extremity supported Sitting balance-Leahy Scale: Fair     Standing balance support: Bilateral upper extremity supported Standing balance-Leahy Scale: Poor Standing balance comment: cannot maintain upright                             Pertinent Vitals/Pain Pain Assessment: Faces Faces Pain Scale: Hurts worst Pain Location: chest with coughing and any movement Pain Descriptors / Indicators: Shooting;Sharp Pain Intervention(s): Limited activity within patient's tolerance;Monitored during session;Repositioned    Home Living Family/patient expects to be discharged to:: Assisted living               Home Equipment: Walker - 2 wheels;Cane - single  point;Shower seat Additional Comments: [facility]    Prior Function Level of Independence: Needs assistance   Gait / Transfers Assistance Needed: Pt reports walking with RW at baseline, states he does not need physical assist for transfers/gait  ADL's / Homemaking Assistance Needed: reports independence with bathing and dressing, meals are provided  Comments: reports falling lately     Hand Dominance        Extremity/Trunk Assessment    Upper Extremity Assessment Upper Extremity Assessment: Generalized weakness    Lower Extremity Assessment Lower Extremity Assessment: Generalized weakness       Communication   Communication: No difficulties  Cognition Arousal/Alertness: Awake/alert Behavior During Therapy: WFL for tasks assessed/performed Overall Cognitive Status: Within Functional Limits for tasks assessed                                        General Comments General comments (skin integrity, edema, etc.): Pt on 2L O2 via  on entry, with SaO2 98%O2, does not use at home so removed and tolerated well until onset of pain with standing, dropped to 70s. When pt sat back replaced O2 and instructed in deep breathing unable to attempt due to pain Pt with skinned knee he thinks from latest fall        Assessment/Plan    PT Assessment Patient needs continued PT services  PT Problem List Decreased strength;Decreased activity tolerance;Decreased balance;Decreased mobility;Pain       PT Treatment Interventions DME instruction;Gait training;Functional mobility training;Therapeutic activities;Therapeutic exercise;Balance training;Cognitive remediation;Patient/family education    PT Goals (Current goals can be found in the Care Plan section)  Acute Rehab PT Goals Patient Stated Goal: have less pain PT Goal Formulation: With patient Time For Goal Achievement: 09/09/20 Potential to Achieve Goals: Fair    Frequency Min 3X/week    AM-PAC PT "6 Clicks" Mobility  Outcome Measure Help needed turning from your back to your side while in a flat bed without using bedrails?: None Help needed moving from lying on your back to sitting on the side of a flat bed without using bedrails?: A Little Help needed moving to and from a bed to a chair (including a wheelchair)?: A Lot Help needed standing up from a chair using your arms (e.g., wheelchair or bedside chair)?: A Lot Help needed to walk in hospital room?:  Total Help needed climbing 3-5 steps with a railing? : Total 6 Click Score: 13    End of Session Equipment Utilized During Treatment: Gait belt;Oxygen Activity Tolerance: Patient limited by pain Patient left: in bed;with call bell/phone within reach;with nursing/sitter in room (changing male purewick) Nurse Communication: Mobility status;Other (comment) (request for eyedrops) PT Visit Diagnosis: Unsteadiness on feet (R26.81);Other abnormalities of gait and mobility (R26.89);Muscle weakness (generalized) (M62.81);Difficulty in walking, not elsewhere classified (R26.2);Pain    Time: 1443-1520 PT Time Calculation (min) (ACUTE ONLY): 37 min   Charges:   PT Evaluation $PT Eval Moderate Complexity: 1 Mod PT Treatments $Therapeutic Activity: 8-22 mins        Britteny Fiebelkorn B. Migdalia Dk PT, DPT Acute Rehabilitation Services Pager [phone] Office [phone]   [place] 08/26/2020, 3:37 PM

## 2020-08-26 NOTE — Progress Notes (Signed)
   Subjective: Hospital day #3 for Zachary [name], an 87 y.o. man with a past medical history of hypertension, cerebral infarction, chronic kidney disease stage IIIb, and chronic tobacco use who presented with shortness of breath and dizziness. Found to have tumors of the lungs, liver, and lymph nodes on CT.   No acute overnight events noted.   This morning, patient feels well and is comfortable without difficulty breathing. Was able to eat his breakfast and have his coffee. States that he spoke with his sister yesterday and discussed plans to pursue comfort care at home to maximize quality of life.   ROS: (+) - epigastric abdominal pain (constant, severity: 7/10)  (-) - nausea, vomiting, headache, fever    Objective:  Vital signs in last 24 hours: Vitals:   08/25/20 1200 08/25/20 1300 08/25/20 2051 08/26/20 0406  BP: (!) 142/59 131/60 (!) 106/56 119/70   Pulse: 87 76 70 70  Resp: (!) 31 17 18 18   Temp: 98.3 F (36.8 C) 98.5 F (36.9 C) 98.3 F (36.8 C) 98.3 F (36.8 C)  TempSrc: Oral Oral Axillary Axillary  SpO2: 97% 92% 95% 94% on 3L [place]   Weight:      Height:       Physical Exam: General: Alert, awake, well-appearing, conversational; no acute distress  Eyes: PERRL, EOM intact  Lungs: Normal pulmonary effort; coarse breath sounds anteriorly  Cardiovascular: Regular rate, rhythm; distant heart sounds  Abdomen: Normal bowel sounds, mildly distended; diffuse tenderness to palpation  Psychiatric: Normal mood, speech, attention   Intake/Output Summary (Last 24 hours) at 08/26/2020 7902 Last data filed at 08/26/2020 0500 Gross per 24 hour  Intake 220 ml  Output 650 ml  Net -430 ml   Labs in the Last 24 Hours:  None   Imaging in the Last 24 Hours:  None   Assessment/Plan: In summary, Zachary [name] is an 87 y.o. man with a past medical history of hypertension, cerebral infarction, and chronic kidney disease stage IIIb who presented with shortness of breath and dizziness.  Found to have likely stage IV pulmonary malignancy on CT and admitted for establishment of hospice care.   Principal Problem:   Disseminated cancer ([place]) Active Problems:   Chronic pain disorder   Essential hypertension  Disseminated Cancer  Chronic Pain  Patient continues to feel well. He reiterates desire to pursue hospice care in the outpatient setting. We spoke with patient's family yesterday, and they support this approach. Both patient and his family state a desire for patient to return to nursing facility, so he can continue doing the things he enjoys (ie. sitting in the park and watching the birds).  - F/u with case manager and palliative care team working with patient. - PT/OT and speech evaluation to assess appropriate discharge location and equipment requirement  - Arrange for hospice care. May need skilled nursing facility given limited mobility.       LOS: 2 days   Gwynneth Albright, Medical Student 08/26/2020, 6:27 AM

## 2020-08-26 NOTE — Progress Notes (Signed)
SATURATION QUALIFICATIONS: (This note is used to comply with regulatory documentation for home oxygen)  Patient Saturations on Room Air at Rest = 92%  Please briefly explain why patient needs home oxygen: Patient does not qualify for home oxgen

## 2020-08-26 NOTE — TOC Initial Note (Addendum)
Transition of Care [facility]) - Initial/Assessment Note    Patient Details  Name: Zachary [name] MRN: [id number] Date of Birth: June 12, 1930  Transition of Care [facility]) CM/SW Contact:    Carles Collet, RN Phone Number: 08/26/2020, 11:35 AM  Clinical Narrative:        Zachary [name] w palliative acre team this morning who is anticipating retrn back to ALF with hospice care. Spoke to patient to clarify services he was receiving there. He stated that he did not have home oxygen, I spoke w [name] who confirmed that he did not have oxygen at the facility. I updated medical team, and requested nurse to obtain ambulatory sats for home oxygen qualification. Patient shared some dissatisfaction w [facility]'s level of assistance, but also spoke about returning to his routine watching the animals outside his window there.  He states that he will be speaking with his sister who lives in [place] at [facility] today for her opinion as to starting hospice. He states that he understands that he is "full of cancer."   Will follow back up with patient to discuss plan after PT OT evals. Hospice will be able to arrange DME including oxygen if he is agreeable to hospice care, if not TOC will need to set up oxygen and HH services at ALF; patient could also potentially require SNF placement.    Patient will need medical transportation to return to ALF.             Expected Discharge Plan: Assisted Living Barriers to Discharge: Continued Medical Work up   Patient Goals and CMS Choice        Expected Discharge Plan and Services Expected Discharge Plan: Assisted Living                                              Prior Living Arrangements/Services                       Activities of Daily Living Home Assistive Devices/Equipment: Environmental consultant (specify type) ADL Screening (condition at time of admission) Patient's cognitive ability adequate to safely complete daily activities?: Yes Is the patient deaf or have  difficulty hearing?: No Does the patient have difficulty seeing, even when wearing glasses/contacts?: Yes Does the patient have difficulty concentrating, remembering, or making decisions?: No Patient able to express need for assistance with ADLs?: Yes Does the patient have difficulty dressing or bathing?: Yes Independently performs ADLs?: No Does the patient have difficulty walking or climbing stairs?: Yes Weakness of Legs: Both Weakness of Arms/Hands: Both  Permission Sought/Granted                  Emotional Assessment              Admission diagnosis:  Lung mass [R91.8] Multiple lung nodules on CT [R91.8] Chronic obstructive pulmonary disease, unspecified COPD type ([place]) [J44.9] Patient Active Problem List   Diagnosis Date Noted  . Disseminated cancer ([place]) 08/24/2020  . CKD (chronic kidney disease), stage IV ([place]) 01/14/2020  . History of CVA (cerebrovascular accident) 01/14/2020  . Dry eyes 05/01/2017  . Senile purpura ([place]) 07/18/2016  . Aortic atherosclerosis ([place]) 02/26/2015  . Allergic rhinitis 09/13/2013  . Osteoarthritis of left knee 03/07/2013  . Swelling of lower extremity 12/07/2012  . Gastritis 07/06/2012  . Obesity (BMI 30.0-34.9) 07/06/2012  . Squamous cell skin  cancer 07/06/2012  . Healthcare maintenance 07/06/2012  . Constipation due to pain medication 06/03/2011  . Arthritis of hip 05/21/2010  . Dizziness 10/30/2006  . Anxiety 06/22/2006  . Major depressive disorder, recurrent severe without psychotic features ([place]) 06/22/2006  . Chronic pain disorder 06/22/2006  . Essential hypertension 06/22/2006  . Gastroesophageal reflux disease with stricture 06/22/2006  . Diverticulosis of colon 06/22/2006  . Benign prostatic hypertrophy with urinary obstruction 06/22/2006  . Facial basal cell cancer 06/22/2006   PCP:  Roetta Sessions, NP Pharmacy:   [facility], [address] [place] [address] Phone: [phone] Fax: [phone]     Social Determinants of Health (SDOH) Interventions    Readmission Risk Interventions Readmission Risk Prevention Plan 01/15/2020 03/15/2019  Transportation Screening Complete Complete  PCP or Specialist Appt within 3-5 Days Complete Not Complete  Not Complete comments - pt going back to ALF  [place] or [place] Complete Complete  Social Work Consult for [place] Planning/Counseling Complete Complete  Palliative Care Screening Not Applicable Not Applicable  Medication Review (RN Care Manager) Referral to Pharmacy Complete  Some recent data might be hidden

## 2020-08-27 LAB — SARS CORONAVIRUS 2 (TAT 6-24 HRS): SARS Coronavirus 2: NEGATIVE

## 2020-08-27 MED ORDER — ONDANSETRON HCL 4 MG/2ML IJ SOLN
4.0000 mg | Freq: Once | INTRAMUSCULAR | Status: AC
  Administered 2020-08-27: 4 mg via INTRAVENOUS
  Filled 2020-08-27: qty 2

## 2020-08-27 MED ORDER — METHADONE HCL 10 MG PO TABS
5.0000 mg | ORAL_TABLET | Freq: Three times a day (TID) | ORAL | Status: DC
Start: 2020-08-27 — End: 2020-08-29
  Administered 2020-08-27 – 2020-08-29 (×6): 5 mg via ORAL
  Filled 2020-08-27 (×6): qty 1

## 2020-08-27 MED ORDER — POLYVINYL ALCOHOL 1.4 % OP SOLN
1.0000 [drp] | OPHTHALMIC | Status: DC | PRN
  Administered 2020-08-27 – 2020-08-28 (×4): 1 [drp] via OPHTHALMIC

## 2020-08-27 MED ORDER — OXYCODONE HCL 5 MG PO TABS
5.0000 mg | ORAL_TABLET | Freq: Four times a day (QID) | ORAL | Status: DC | PRN
  Administered 2020-08-27 – 2020-08-28 (×4): 5 mg via ORAL
  Filled 2020-08-27 (×4): qty 1

## 2020-08-27 NOTE — Discharge Summary (Signed)
Name: Zachary [name] MRN: [id number] DOB: [birth year] 87 y.o. PCP: Roetta Sessions, NP  Date of Admission: 08/24/2020 10:26 AM Date of Discharge: 08/28/2020 Attending Physician: Axel Filler, *  Discharge Diagnosis: 1. Principal Problem:   Disseminated cancer [facility]) Active Problems:   Chronic pain disorder   Essential hypertension  Discharge Medications: Allergies as of 08/28/2020      Reactions   Clindamycin/lincomycin Rash, Other (See Comments)   "Allergic," per MAR   Lincomycin Hcl Rash, Other (See Comments)   "Allergic," per [facility]      Medication List    STOP taking these medications   amLODipine 10 MG tablet Commonly known as: NORVASC   aspirin 81 MG EC tablet   atorvastatin 40 MG tablet Commonly known as: LIPITOR   clopidogrel 75 MG tablet Commonly known as: PLAVIX   ferrous gluconate 324 MG tablet Commonly known as: FERGON   fluticasone 50 MCG/ACT nasal spray Commonly known as: FLONASE   guaiFENesin 600 MG 12 hr tablet Commonly known as: MUCINEX   levocetirizine 5 MG tablet Commonly known as: XYZAL   lisinopril 20 MG tablet Commonly known as: ZESTRIL   omeprazole 20 MG capsule Commonly known as: PRILOSEC   risperiDONE 0.25 MG tablet Commonly known as: RISPERDAL   Tab-A-Vite Tabs     TAKE these medications   acetaminophen 325 MG tablet Commonly known as: TYLENOL Take 650 mg by mouth every 6 (six) hours as needed for headache.   ammonium lactate 12 % lotion Commonly known as: LAC-HYDRIN Apply 1 application topically 2 (two) times daily as needed for dry skin (apply to both legs and feet).   Bandage Roll 3"x75" Misc Use as directed   Carboxymethylcellulose Sod PF 0.5 % Soln Place 1 drop into both eyes 3 (three) times daily.   diclofenac sodium 1 % Gel Commonly known as: VOLTAREN Apply 4 g topically 4 (four) times daily as needed.   feeding supplement Liqd Take 237 mLs by mouth 3 (three) times daily with meals.    finasteride 5 MG tablet Commonly known as: Proscar Take 1 tablet (5 mg total) by mouth daily.   furosemide 40 MG tablet Commonly known as: LASIX Take 1 tablet (40 mg total) by mouth daily.   LORazepam 0.5 MG tablet Commonly known as: ATIVAN Take 1 tablet (0.5 mg total) by mouth See admin instructions. Take one tablet (0.5 mg) by mouth three times daily, may also take one tablet (0.5 mg) every 12 hours as needed for anxiety What changed: additional instructions   methadone 5 MG tablet Commonly known as: DOLOPHINE Take 1 tablet (5 mg total) by mouth every 8 (eight) hours.   mirtazapine 30 MG tablet Commonly known as: REMERON Take 30 mg by mouth at bedtime.   ondansetron 4 MG tablet Commonly known as: ZOFRAN Take 1 tablet (4 mg total) by mouth every 8 (eight) hours as needed for up to 3 days for nausea or vomiting.   oxyCODONE 5 MG immediate release tablet Commonly known as: Roxicodone Take 1 tablet (5 mg total) by mouth every 4 (four) hours as needed for up to 3 days for severe pain.   polyethylene glycol powder 17 GM/SCOOP powder Commonly known as: GLYCOLAX/MIRALAX Take 17 g by mouth daily.   senna 8.6 MG tablet Commonly known as: SENOKOT Take 3 tablets by mouth at bedtime.   sertraline 100 MG tablet Commonly known as: ZOLOFT Take 150 mg by mouth at bedtime.   tamsulosin 0.4 MG Caps capsule Commonly  known as: Flomax Take 1 capsule (0.4 mg total) by mouth daily.   wound dressings gel Apply topically as needed for wound care.       Disposition and follow-up:   Zachary [name] was discharged from [facility] in Serious condition. Patient transitioned to hospice for his newly diagnosed disseminated cancer with limited life expectancy and poor prognosis. Patient discharged back to [facility] with increased level of care and hospice services.  Follow-up Appointments:  Follow-up Information    [place], [facility] Follow up.   Specialty:  Assisted Living Facility Contact information: [place] [address] [address] .   Specialty: Hospice and Palliative Medicine Contact information: [place] Hospital Course by problem list: 1. Disseminated cancer: Patient presented to [facility] following a fall at his assisted living facility. On arrival, patient noted to be significantly short of breath with hypoxia on room air. Patient placed on supplemental oxygen with improvement in oxygen saturation. Patient underwent CT Chest and CT Renal Stone Study which revealed multiple large tumors in all lung fields, the liver, thyroid, and lymph nodes. Palliative medicine team consulted for assistance given his new diagnosis. Patient discussed his case with his family and desired to transition to hospice upon discharge as he is unlikely to have meaningful benefit from diagnostic evaluation and treatment of his significant disseminated cancer. Patient restarted on methadone 5mg  Q8H and oxycodone 5mg  Q6H PRN for pain control. Patient discharged back to [facility] with hospice services and increased level of supervision and assistance by faculty with anticipated plan to escalate level of care in coming days/weeks.  Pertinent Labs, Studies, and Procedures:  CBC Latest Ref Rng & Units 08/25/2020 08/24/2020 08/24/2020  WBC 4.0 - 10.5 K/uL 6.3 - 9.0  Hemoglobin 13.0 - 17.0 g/dL 11.9(L) 12.2(L) 11.8(L)  Hematocrit 39.0 - 52.0 % 35.5(L) 36.0(L) 35.8(L)  Platelets 150 - 400 K/uL 178 - 186   CMP Latest Ref Rng & Units 08/25/2020 08/24/2020 08/24/2020  Glucose 70 - 99 mg/dL 149(H) - -  BUN 8 - 23 mg/dL 28(H) - -  Creatinine 0.61 - 1.24 mg/dL 1.68(H) - -  Sodium 135 - 145 mmol/L 139 140 -  Potassium 3.5 - 5.1 mmol/L 3.5 3.0(L) -  Chloride 98 - 111 mmol/L 102 - -  CO2 22 - 32 mmol/L 27 - -  Calcium 8.9 - 10.3 mg/dL 9.0 - -  Total Protein 6.5 - 8.1  g/dL - - 6.9  Total Bilirubin 0.3 - 1.2 mg/dL - - 0.9  Alkaline Phos 38 - 126 U/L - - 109  AST 15 - 41 U/L - - 84(H)  ALT 0 - 44 U/L - - 26  DG Chest 2 View  Result Date: 08/24/2020 CLINICAL DATA:  Shortness of breath and chest wall pain. EXAM: CHEST - 2 VIEW COMPARISON:  January 13, 2020 FINDINGS: There is limited evaluation of the superior mediastinum and right apex secondary to positioning of the patient's head and neck. Stable, diffuse, chronic appearing increased interstitial lung markings are noted. Mild, stable right basilar scarring and/or atelectasis is seen. Interval development of an ill-defined 1.8 cm diameter nodular opacity is seen within the mid right lung. A similar appearing 1.2 cm diameter focal opacity is seen overlying the left upper lobe. A 1.0 cm ill-defined opacity is seen overlying the left lung base.  The cardiac silhouette is mildly enlarged. There is mild calcification of the aortic arch. A chronic seventh right rib fracture is noted. Degenerative changes seen throughout the thoracic spine. IMPRESSION: 1. Interval development of bilateral ill-defined nodular opacities, as described above. Correlation with chest CT is recommended to exclude the presence of multiple new pulmonary nodules. 2. Mild, stable right basilar scarring and/or atelectasis. Electronically Signed   By: Virgina Norfolk M.D.   On: 08/24/2020 11:21   CT Chest Wo Contrast  Result Date: 08/24/2020 CLINICAL DATA:  Pulmonary nodules on chest radiograph EXAM: CT CHEST WITHOUT CONTRAST TECHNIQUE: Multidetector CT imaging of the chest was performed following the standard protocol without IV contrast. COMPARISON:  Chest radiograph dated 08/24/2020. CT chest dated 01/14/2020. FINDINGS: Cardiovascular: Heart is normal in size.  No pericardial effusion. No evidence of thoracic aortic aneurysm. Atherosclerotic calcifications of the aortic arch. Three vessel coronary atherosclerosis. Mediastinum/Nodes: 2.1 cm subcarinal node  (series 2/image 50). Additional small mediastinal nodes, measuring up to 10 mm in the right paratracheal region (series 3/image 38). 14 mm short axis right supraclavicular node (series 3/image 1), incompletely visualized. 15 mm right thyroid nodule (series 3/image 1). In the setting of significant comorbidities or limited life expectancy, no follow-up recommended (ref: J Am Coll Radiol. 2015 Feb;12(2): 143-50). Lungs/Pleura: 4.3 cm mass in the medial left lower lobe (series 4/image 72). Additional bilateral pulmonary nodules, including: --2.0 cm in the right upper lobe (series 4/image 23) --12 mm in the left upper lobe (series 4/image 38) --2.3 cm in the right middle lobe (series 4/image 46) --10 mm in the right lower lobe (series 4/image 70) Patchy opacity inferiorly in the right middle lobe, favoring atelectasis. Bronchiectasis in the right middle lobe, lingula, and bilateral lower lobes with bronchial wall thickening in the bilateral lower lobes. No pleural effusion or pneumothorax. Upper Abdomen: Visualized upper abdomen is notable for a suspected 5.8 cm mass in the posterior right hepatic lobe (series 3/image 111), poorly evaluated on unenhanced CT. 14 mm nodule along the posterior right upper abdomen (series 3/image 122), indeterminate. Atherosclerotic calcifications of the abdominal aorta. Musculoskeletal: Degenerative changes of the visualized thoracolumbar spine. Healing left medial clavicle fracture (series 3/image 14). IMPRESSION: Dominant 4.3 cm left lower lobe mass. Additional bilateral pulmonary nodules measuring up to 2.3 cm. Differential considerations include left lower lobe primary bronchogenic neoplasm with bilateral pulmonary metastases versus metastatic disease from a nonvisualized primary. Suspected thoracic nodal metastases, as above, including a 14 mm short axis right supraclavicular node which would be amenable to surgical excision/tissue sampling as clinically warranted. Suspected right  hepatic metastasis, poorly visualized. Healing left medial clavicle fracture. Consider PET-CT for further evaluation/staging as clinically warranted. Aortic Atherosclerosis (ICD10-I70.0). Electronically Signed   By: Julian Hy M.D.   On: 08/24/2020 14:46   CT Renal Stone Study  Result Date: 08/24/2020 CLINICAL DATA:  Left flank pain, multiple pulmonary nodules/masses on chest CT EXAM: CT ABDOMEN AND PELVIS WITHOUT CONTRAST TECHNIQUE: Multidetector CT imaging of the abdomen and pelvis was performed following the standard protocol without IV contrast. COMPARISON:  CTA abdomen/pelvis dated 12/16/2017 FINDINGS: Lower chest: Evaluated on dedicated CT chest. Hepatobiliary: Dominant 6.6 cm mass inferiorly in segment 6 (series 3/image 22). Additional 13 mm lesion in segment 8 (series 3/image 11). 10 mm probable cyst in segment 4 (series 3/image 14). Pancreas: Within normal limits. Spleen: Subtle 13 mm lesion in the central spleen (series 3/image 22), poorly evaluated. Adrenals/Urinary Tract: Adrenal glands are within normal limits. Lobular right renal contour, unchanged  from 2019. Left kidney is within normal limits. No renal, ureteral, or bladder calculi. No hydronephrosis. Bladder is partially obscured by streak artifact, but is grossly unremarkable. Stomach/Bowel: Stomach is within normal limits. No evidence of bowel obstruction. Appendix is not discretely visualized. No colonic wall thickening or mass is evident on CT. Vascular/Lymphatic: No evidence of abdominal aortic aneurysm. Atherosclerotic calcifications of the abdominal aorta and branch vessels. No suspicious abdominopelvic lymphadenopathy. Reproductive: Prostate is grossly unremarkable. Other: No abdominopelvic ascites. 14 mm soft tissue implant along the posterior aspect of the right upper abdomen (series 3/image 25), grossly unchanged from 0354, of uncertain etiology but likely benign. Musculoskeletal: Mild degenerative changes of the visualized  thoracolumbar spine. Left hip hemiarthroplasty, without evidence of complication. IMPRESSION: Limited evaluation due to lack of intravenous contrast administration. No findings suspicious for primary malignancy. Dominant 6.6 cm hepatic mass inferiorly in segment 6. Additional 13 mm lesion in segment 8. These are considered suspicious for metastases. Soft tissue implant along the posterior right upper abdomen is unchanged from 2019 and likely benign. Electronically Signed   By: Julian Hy M.D.   On: 08/24/2020 16:35   Discharge Instructions: Discharge Instructions    Increase activity slowly   Complete by: As directed      Signed: Cato Mulligan, MD 08/28/2020, 2:54 PM   Pager: [phone]
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
## 2020-08-27 NOTE — Progress Notes (Addendum)
   Subjective: Hospital day #4 for Rennis Golden, an 87 y.o. man with a past medical history of hypertension, cerebral infarction, chronic kidney disease stage IIIb, and chronic tobacco use who presented with shortness of breath and dizziness. Found to have tumors of the lungs, liver, and lymph nodes on CT.   No acute overnight events.   This morning, patient states that he feels well. Notes mild epigastric pain that improves with medication. Was able to eat his breakfast.   Objective:  Vital signs in last 24 hours: Vitals:   08/26/20 0910 08/26/20 1500 08/26/20 2106 08/27/20 0435  BP:  121/77 (!) 161/65 (!) 156/66  Pulse:  83 75 63  Resp:  17 20 (!) 21  Temp:  98.1 F (36.7 C) 98.3 F (36.8 C) 98.1 F (36.7 C)  TempSrc:  Axillary Axillary Axillary  SpO2: 96% 94% 96% 92% on 3L [place]      Intake/Output Summary (Last 24 hours) at 08/27/2020 1128 Last data filed at 08/26/2020 1700 Gross per 24 hour  Intake -  Output 500 ml  Net -500 ml   Labs in Last 24 Hours: None  Imaging in Last 24 Hours:  None  Physical Exam:  General: Alert, awake; no acute distress Cardiovascular: Regular rate, rhythm; no murmurs/rubs/gallops Lungs: Normal pulmonary effort; coarse breath sounds anteriorly   Abdomen: Normal bowel sounds, non-distended; diffusely tender to palpation  Psychiatric: Normal mood, speech, attention   Assessment/Plan: In summary, Owynn Mosqueda is an 87 y.o. man with a past medical history of hypertension, cerebral infarction, and chronic kidney disease stage IIIb who presented with shortness of breath and dizziness. Found to have likely stage IV pulmonary malignancy on CT and admitted for establishment of hospice care at [facility].  Principal Problem:   Disseminated cancer ([place]) Active Problems:   Chronic pain disorder   Essential hypertension  Disseminated Cancer Chronic Pain Patient likely has stage IV lung cancer with metastasis to liver, lymph nodes and will require  placement at skilled nursing facility (SNF). PT assessed patient and note limited mobility. They also recommend SNF care at discharge. ALF care will not be adequate. Unclear if patient's home facility [facility]) provides skilled nursing care. Palliative care team has evaluated patient and started Methadone 5 mg PO Q8H and Oxycodone 5 mg PO PRN for pain. Palliative plans to compete medical orders for scope of treatment (MOST) today.  - Continue to arrange for hospice at SNF on discharge  - Medically ready for discharge     LOS: 3 days   Gwynneth Albright, Medical Student 08/27/2020, 11:28 AM

## 2020-08-27 NOTE — Progress Notes (Signed)
Manufacturing engineer [facility]) Hospital Liaison Note:  Reached out to patient via telephone to confirm interest but patient did not want to discuss hospice services this afternoon, stating he was too tired to think about it today. Offered to visit him at bedside but he declined at this time requesting for an [facility] Liaison to visit him tomorrow to discuss hospice. Patient did confirm interest in hospice.    An [facility] Liaison will touch base with patient tomorrow to explain services, DME and answer hospice related questions.    Thank you in advance for the opportunity to participate in this patient's care,  Gar Ponto, RN [facility] HLT [phone]

## 2020-08-27 NOTE — Progress Notes (Signed)
Palliative:  HPI: 87 y.o. male  with past medical history of stroke, hypertension, skin cancer, chronic tobacco and alcohol use admitted on 08/24/2020 from [facility] with shortness of breath and dizziness and multiple lung nodules noted on CT chest. Concern for potential mets to liver and kidneys. Chronic pain and declining functional status. He has expressed to primary team no desire to pursue further work up and treatment. Palliative care consulted to discuss goals further with consideration of hospice services.   I met again today with Zachary [name]. He complains of pain. Will increase OxyIR per request and will add back methadone as he confirms he continues to take this and I confirmed this is active on [facility] paperwork in shadow chart. He confirms desire for comfort focused care and return to [facility] with hospice support. We looked at MOST form but he does not have his glasses and cannot see form so I will not ask him to sign a form he cannot see. Goals are clear for comfort and hospice support. He also expresses that he is okay for [place] to discuss care and information with sister, Zachary [name]), and his friend, Zachary [name].   All questions/concerns addressed. Emotional support.   Exam: Alert, oriented. No distress. Generalized pain to chest - not a new pain but relieved with pain medication. Breathing regular, unlabored with oxygen. Abd soft. Generalized weakness.   Plan: - Return to [facility] with hospice in place.  - Comfort focused care with no desire to return to the hospital.  - Pain: Restarted methadone 5 mg every 8 hours. OxyIR 5 mg every 6 hours as needed. May increase frequency/dosage as needed to achieve relief.   Maud, NP Palliative Medicine Team Pager [phone] (Please see [email or web address] for schedule) Team Phone [phone]    Greater than 50%  of this time was spent counseling and coordinating care related to the above assessment and plan

## 2020-08-27 NOTE — TOC Initial Note (Addendum)
Transition of Care [facility]) - Initial/Assessment Note    Patient Details  Name: Zachary [name] MRN: [id number] Date of Birth: August 10, 1929  Transition of Care [facility] CM/SW Contact:    Benard Halsted, LCSW Phone Number: 08/27/2020, 2:18 PM  Clinical Narrative:                 12pm-CSW contacted [name] and requested to speak with their nurse to see if patient can return with hospice services. Secretary stated she would have someone call CSW back.  2pm-CSW contacted [facility] again and the secretary stated that the RN had went home sick and they do not have any other nurses there to approve patient's return with hospice. CSW asked to speak with their administrator; Network engineer gave CSW the cell phone number for the RN, Donnetta Simpers [phone]). CSW left her a voicemail.   2:45pm-CSW received call back from [facility]. She stated they can accept patient back tomorrow with hospice. Their preferred agency is [facility]. CSW went over his dietary needs and DME; she requested oxygen and a bedside table from hospice. She has access to Epic and will be able to review the fl2 and dc summary tomorrow once available. CSW sent referral to [facility] ([place]) for review.   Expected Discharge Plan: Assisted Living Barriers to Discharge: Continued Medical Work up   Patient Goals and CMS Choice Patient states their goals for this hospitalization and ongoing recovery are:: comfort CMS Medicare.gov Compare Post Acute Care list provided to:: Patient Choice offered to / list presented to : Patient  Expected Discharge Plan and Services Expected Discharge Plan: Assisted Living In-house Referral: Clinical Social Work,Hospice / [facility] Acute Care Choice: Hospice Living arrangements for the past 2 months: [place]                                      Prior Living Arrangements/Services Living arrangements for the past 2 months: [place] Lives with::  Facility Resident Patient language and need for interpreter reviewed:: Yes Do you feel safe going back to the place where you live?: Yes      Need for Family Participation in Patient Care: Yes (Comment) Care giver support system in place?: Yes (comment)   Criminal Activity/Legal Involvement Pertinent to Current Situation/Hospitalization: No - Comment as needed  Activities of Daily Living Home Assistive Devices/Equipment: Gilford Rile (specify type) ADL Screening (condition at time of admission) Patient's cognitive ability adequate to safely complete daily activities?: Yes Is the patient deaf or have difficulty hearing?: No Does the patient have difficulty seeing, even when wearing glasses/contacts?: Yes Does the patient have difficulty concentrating, remembering, or making decisions?: No Patient able to express need for assistance with ADLs?: Yes Does the patient have difficulty dressing or bathing?: Yes Independently performs ADLs?: No Does the patient have difficulty walking or climbing stairs?: Yes Weakness of Legs: Both Weakness of Arms/Hands: Both  Permission Sought/Granted Permission sought to share information with : Facility Contact Representative,Family Supports Permission granted to share information with : Yes, Verbal Permission Granted  Share Information with NAME: [name]  Permission granted to share info w AGENCY: [name]  Permission granted to share info w Relationship: Sister  Permission granted to share info w Contact Information: [phone]  Emotional Assessment Appearance:: Appears stated age Attitude/Demeanor/Rapport: Engaged Affect (typically observed): Accepting,Appropriate Orientation: : Oriented to Self,Oriented to Place,Oriented to  Time,Oriented to Situation Alcohol / Substance Use:  Not Applicable Psych Involvement: No (comment)  Admission diagnosis:  Lung mass [R91.8] Multiple lung nodules on CT [R91.8] Chronic obstructive pulmonary disease,  unspecified COPD type ([place]) [J44.9] Patient Active Problem List   Diagnosis Date Noted  . Disseminated cancer ([place]) 08/24/2020  . CKD (chronic kidney disease), stage IV ([place]) 01/14/2020  . History of CVA (cerebrovascular accident) 01/14/2020  . Dry eyes 05/01/2017  . Senile purpura ([place]) 07/18/2016  . Aortic atherosclerosis ([place]) 02/26/2015  . Allergic rhinitis 09/13/2013  . Osteoarthritis of left knee 03/07/2013  . Swelling of lower extremity 12/07/2012  . Gastritis 07/06/2012  . Obesity (BMI 30.0-34.9) 07/06/2012  . Squamous cell skin cancer 07/06/2012  . Healthcare maintenance 07/06/2012  . Constipation due to pain medication 06/03/2011  . Arthritis of hip 05/21/2010  . Dizziness 10/30/2006  . Anxiety 06/22/2006  . Major depressive disorder, recurrent severe without psychotic features ([place]) 06/22/2006  . Chronic pain disorder 06/22/2006  . Essential hypertension 06/22/2006  . Gastroesophageal reflux disease with stricture 06/22/2006  . Diverticulosis of colon 06/22/2006  . Benign prostatic hypertrophy with urinary obstruction 06/22/2006  . Facial basal cell cancer 06/22/2006   PCP:  Roetta Sessions, NP Pharmacy:   [facility], [place] [address] Phone: [phone] Fax: [phone]     Social Determinants of Health (SDOH) Interventions    Readmission Risk Interventions Readmission Risk Prevention Plan 01/15/2020 03/15/2019  Transportation Screening Complete Complete  PCP or Specialist Appt within 3-5 Days Complete Not Complete  Not Complete comments - pt going back to ALF  [place] or [place] Complete Complete  Social Work Consult for [place] Planning/Counseling Complete Complete  Palliative Care Screening Not Applicable Not Applicable  Medication Review (RN Care Manager) Referral to Pharmacy Complete  Some recent data might be hidden

## 2020-08-27 NOTE — Progress Notes (Signed)
IMTS paged due to patient c/o of nausea with dry heaving and unresolved chest pain.   One time dose of IV zofran ordered. Provider unaware about the chest pain. However, provider stated to continue to give pain medication as prescribed and keep him comfortable. Patient is comfort care, not much else can be done.

## 2020-08-27 NOTE — Evaluation (Signed)
Occupational Therapy Evaluation Patient Details Name: Zachary [name] MRN: [id number] DOB: 08/14/1929 Today's Date: 08/27/2020    History of Present Illness 87 y.o. man  who presented with shortness of breath and dizziness after a fall at his ALF.  Found to have tumors of the lungs, liver, and lymph nodes on CT. past medical history of hypertension, cerebral infarction, chronic kidney disease stage IIIb, and chronic tobacco   Clinical Impression   PTA, pt from [facility] and up until recently, pt was ambulatory with RW and able to complete ADL tasks. Pt presents now with diagnoses above and deficits in strength, balance, cardiopulmonary tolerance and pain. Pt overall Mod A for bed mobility to sit EOB, but due to increased chest pain with movement/exertion, pt declined further OOB attempts. At this time, pt requires Mod A for UB ADLs and Total A for LB ADLs due to deficits. Educated pt on energy conservation strategies, maintenance of skin integrity and prevention of pressure wound formation, and encouraged frequent repositioning. Noted ongoing [place] discussion with consideration of hospice care. Plan to progress gradual activities as tolerated.     Follow Up Recommendations  Other (comment) (SNF level hospice care vs increased support/assist at current ALF with hospice care)    Equipment Recommendations  Wheelchair (measurements OT);Hospital bed;Wheelchair cushion (measurements OT)    Recommendations for Other Services       Precautions / Restrictions Precautions Precautions: Fall Restrictions Weight Bearing Restrictions: No      Mobility Bed Mobility Overal bed mobility: Needs Assistance Bed Mobility: Supine to Sit;Sit to Supine     Supine to sit: HOB elevated;Mod assist Sit to supine: Mod assist   General bed mobility comments: Mod A to sit EOB, able to move LE off of bed but requires increased assist for trunk. Pt sat EOB for < 2 min before reporting need to lay back down-  assist to get LE back into bed    Transfers                 General transfer comment: pt declined to attempt due to increasing pain with exertion.    Balance Overall balance assessment: Needs assistance Sitting-balance support: Feet supported;No upper extremity supported;Bilateral upper extremity supported Sitting balance-Leahy Scale: Fair                                     ADL either performed or assessed with clinical judgement   ADL Overall ADL's : Needs assistance/impaired Eating/Feeding: Set up;Sitting   Grooming: Set up;Sitting   Upper Body Bathing: Moderate assistance;Sitting   Lower Body Bathing: Total assistance;Sit to/from stand;Sitting/lateral leans   Upper Body Dressing : Minimal assistance;Sitting   Lower Body Dressing: Total assistance;Sit to/from stand;Sitting/lateral leans       Toileting- Clothing Manipulation and Hygiene: Total assistance;Bed level         General ADL Comments: Pt limited by increased chest pain with movement, exertion (reports cancer is in his lungs). Pt with decreased overall strength, endurance for any OOB attempts     Vision Patient Visual Report: No change from baseline Vision Assessment?: No apparent visual deficits     Perception     Praxis      Pertinent Vitals/Pain Pain Assessment: Faces Faces Pain Scale: Hurts even more Pain Location: chest during coughing, movement Pain Descriptors / Indicators: Discomfort;Grimacing;Pressure Pain Intervention(s): Monitored during session;Patient requesting pain meds-RN notified     Hand  Dominance Right   Extremity/Trunk Assessment Upper Extremity Assessment Upper Extremity Assessment: Generalized weakness   Lower Extremity Assessment Lower Extremity Assessment: Defer to PT evaluation   Cervical / Trunk Assessment Cervical / Trunk Assessment: Kyphotic   Communication Communication Communication: No difficulties   Cognition Arousal/Alertness:  Awake/alert Behavior During Therapy: WFL for tasks assessed/performed Overall Cognitive Status: Within Functional Limits for tasks assessed                                 General Comments: A&Ox4, pleasant, understands complexities of diagnosis, etc   General Comments  O2 >90% on 1 L throughout. Pt limited by chest pain with coughing, exertion. encouraged to hold pillow to soften blow of coughing. Pt aware of decisions for prognosis, encouraged pt to sit up frequently (even with chair position in bed) to prevent compromised lung function. Encouraged frequent changing of positions to lay on side, etc to prevent sacral skin breakdown.    Exercises     Shoulder Instructions      Home Living Family/patient expects to be discharged to:: Assisted living                             Home Equipment: Gilford Rile - 2 wheels;Cane - single point;Shower seat   Additional Comments: [facility]      Prior Functioning/Environment Level of Independence: Needs assistance  Gait / Transfers Assistance Needed: Pt reports walking with RW at baseline, states he does not need physical assist for transfers/gait ADL's / Homemaking Assistance Needed: reports independence with bathing and dressing, meals are provided   Comments: reports falling lately        OT Problem List: Decreased strength;Decreased activity tolerance;Impaired balance (sitting and/or standing);Cardiopulmonary status limiting activity;Pain      OT Treatment/Interventions: Self-care/ADL training;Therapeutic exercise;Energy conservation;DME and/or AE instruction;Therapeutic activities;Patient/family education;Balance training    OT Goals(Current goals can be found in the care plan section) Acute Rehab OT Goals Patient Stated Goal: have less pain OT Goal Formulation: With patient Time For Goal Achievement: 09/10/20 Potential to Achieve Goals: Fair ADL Goals Pt Will Transfer to Toilet: with mod  assist;stand pivot transfer;bedside commode Additional ADL Goal #1: Pt to demonstrate ability to sit EOB > 5 min during ADL tasks with no more than supervision needed to maintain balance Additional ADL Goal #2: Pt to verbalize at least 2 energy conservation strategies to implement during daily tasks Additional ADL Goal #3: Pt to demonstrate bed mobility at Min guard to maximize ability to change positions frequently and prevent skin breakdown  OT Frequency: Min 2X/week   Barriers to D/C:            Co-evaluation              AM-PAC OT "6 Clicks" Daily Activity     Outcome Measure Help from another person eating meals?: A Little Help from another person taking care of personal grooming?: A Little Help from another person toileting, which includes using toliet, bedpan, or urinal?: Total Help from another person bathing (including washing, rinsing, drying)?: A Lot Help from another person to put on and taking off regular upper body clothing?: A Little Help from another person to put on and taking off regular lower body clothing?: Total 6 Click Score: 13   End of Session Equipment Utilized During Treatment: Oxygen Nurse Communication: Mobility status;Patient requests pain meds  Activity  Tolerance: Patient limited by pain Patient left: in bed;with call bell/phone within reach;with bed alarm set  OT Visit Diagnosis: Unsteadiness on feet (R26.81);Other abnormalities of gait and mobility (R26.89);Muscle weakness (generalized) (M62.81);Pain Pain - part of body:  (lungs, chest)                Time: 0932-6712 OT Time Calculation (min): 20 min Charges:  OT General Charges $OT Visit: 1 Visit OT Evaluation $OT Eval Moderate Complexity: 1 Mod  Malachy Chamber, OTR/L Acute Rehab Services Office: [phone]  Layla Maw 08/27/2020, 9:37 AM

## 2020-08-28 ENCOUNTER — Other Ambulatory Visit: Payer: Self-pay | Admitting: Student

## 2020-08-28 MED ORDER — RIVAROXABAN 10 MG PO TABS
10.0000 mg | ORAL_TABLET | Freq: Every day | ORAL | Status: DC
  Administered 2020-08-28 – 2020-08-29 (×2): 10 mg via ORAL
  Filled 2020-08-28 (×2): qty 1

## 2020-08-28 MED ORDER — MORPHINE SULFATE (CONCENTRATE) 10 MG/0.5ML PO SOLN
10.0000 mg | ORAL | Status: DC | PRN
  Administered 2020-08-28 – 2020-08-29 (×5): 10 mg via SUBLINGUAL
  Filled 2020-08-28 (×5): qty 0.5

## 2020-08-28 MED ORDER — ONDANSETRON HCL 4 MG PO TABS
4.0000 mg | ORAL_TABLET | Freq: Three times a day (TID) | ORAL | 0 refills | Status: AC | PRN
Start: 2020-08-28 — End: 2020-08-31

## 2020-08-28 MED ORDER — OXYCODONE HCL 5 MG PO TABS: 5.0000 mg | ORAL_TABLET | ORAL | 0 refills | Status: AC | PRN

## 2020-08-28 NOTE — Progress Notes (Signed)
Attempted to give report to facility. Called number given by SW twice. Both times got automated voicemail with directions to leave a message for direct callback, but never was able to leave a message.

## 2020-08-28 NOTE — Plan of Care (Signed)
Patient is resting in bed. C/o pain, given PRN tylenol and oxy. Repositioned per patients request, refusing most of the turns. Pills crushed with pudding, pt c/o of pills feeling hard for him to get down. Tolerating thin liquids. Productive cough, coughing up mucus plugs. VSS. Placed on 2L at night, oxygen stats 88-89% on room air when sleeping. Call bell within reach. Bed alarm on.   Problem: Education: Goal: Knowledge of General Education information will improve Description: Including pain rating scale, medication(s)/side effects and non-pharmacologic comfort measures Outcome: Progressing   Problem: Health Behavior/Discharge Planning: Goal: Ability to manage health-related needs will improve Outcome: Progressing   Problem: Clinical Measurements: Goal: Ability to maintain clinical measurements within normal limits will improve Outcome: Progressing Goal: Will remain free from infection Outcome: Progressing Goal: Diagnostic test results will improve Outcome: Progressing Goal: Respiratory complications will improve Outcome: Progressing Goal: Cardiovascular complication will be avoided Outcome: Progressing   Problem: Activity: Goal: Risk for activity intolerance will decrease Outcome: Progressing   Problem: Nutrition: Goal: Adequate nutrition will be maintained Outcome: Progressing   Problem: Coping: Goal: Level of anxiety will decrease Outcome: Progressing   Problem: Elimination: Goal: Will not experience complications related to bowel motility Outcome: Progressing Goal: Will not experience complications related to urinary retention Outcome: Progressing   Problem: Pain Managment: Goal: General experience of comfort will improve Outcome: Progressing   Problem: Safety: Goal: Ability to remain free from injury will improve Outcome: Progressing   Problem: Skin Integrity: Goal: Risk for impaired skin integrity will decrease Outcome: Progressing

## 2020-08-28 NOTE — Progress Notes (Addendum)
Manufacturing engineer (ACC)  Met with patient at the bedside, he confirms interest in hospice support at [facility].  DME ordered: O2 PRN (per request of [facility]) and bedside table.   Once DME is in place and [facility] advises that all is ready, pt can d/c back to [facility].   If pt is needing comfort meds and they are not already in place, please arrange for a 1-2 day supply until hospice can begin services so there is no lapse in [facility] being able to meet his needs for comfort.   **addendum, pt has needed anxiolytics and comfort medications.   Thank you, Venia Carbon RN, BSN, [facility] Liaison

## 2020-08-28 NOTE — Progress Notes (Addendum)
   Subjective: Hospital day #5 for Zachary [name], an 87 y.o. man with a past medical history of hypertension, cerebral infarction, chronic kidney disease stage IIIb, and chronic tobacco use who presented with shortness of breath and dizziness. Found to have tumors of the lungs, liver, and lymph nodes on CT.   Overnight, patient reported nausea, dry heaving with chest pain. Given IV zofran by IMTS and pain medication as prescribed to maintain comfort.   This morning, patient reports increased lower chest, epigastric pain when coughing. Cough is productive of brown sputum. Also notes pain of the left leg below the knee.   Objective:  Vital signs in last 24 hours: Vitals:   08/27/20 1359 08/27/20 1944 08/27/20 2300 08/28/20 0407  BP:  (!) 145/69  134/67  Pulse: 76 76  71  Resp: 18 20  20   Temp: 98.1 F (36.7 C) 97.9 F (36.6 C)  98.3 F (36.8 C)  TempSrc: Oral Axillary  Axillary  SpO2: 96% 90% 92% 94%   Physical Exam:  General: Alert, awake, no acute distress; coughing  Cardiovascular: Regular rate, rhythm; no murmurs/rub/gallops Lungs: Coarse breath sounds, anteriorly  Abdomen: Normal BS, soft; diffusely tender to palpation Extremities: No edema; pain to palpation below left knee Psychiatric: Normal mood, speech, attention   Intake/Output Summary (Last 24 hours) at 08/28/2020 1152 Last data filed at 08/27/2020 2200 Gross per 24 hour  Intake 120 ml  Output 800 ml  Net -680 ml   Lab in Last 24 Hours:  None   Imaging in Last 24 Hours:  None   Assessment/Plan: In summary, Zachary [name] is an 87 y.o. man with a past medical history of hypertension, cerebral infarction, and chronic kidney disease stage IIIb who presented with shortness of breath and dizziness. Found to have likely stage IV pulmonary malignancy on CT and admitted for establishment of hospice care at [facility] or SNF-level facility.  Principal Problem:   Disseminated cancer ([place]) Active Problems:   Chronic pain  disorder   Essential hypertension  #Disseminated Cancer  Patient likely has stage IV lung cancer with metastasis to liver, lymph nodes. Patient would like hospice care. He will require hospice at a SNF or a facility that can provide a similar level of care given his limited mobility, supervision requirements. Patient's ALF ([facility]) does not have SNF services but can facilitate supplemental outpatient hospice care [facility]). Patient expresses desire to go to [facility] to retrieve his personal belongings but is concerned that he will not get adequate management there without hospice. During our encounter, he was willing to go to [place] so long as hospice is involved with his care. - F/u with hospice (plans to visit patient in-person today to discuss care) to coordinate care   - Switch to morphine 10 mg Q2H SI, continue methadone 5 mg Q8H PO; d/c Oxycodone 5 mg PO PRN  - Medically ready for discharge   #VTE PPx - Switch to Rivaroxaban (Xarelto) 10 mg PO daily. D/c Lovenox.   #Diet - DYS1 diet per speech evaluation recommendation. Patient may have other consistencies if he wishes.    LOS: 4 days   Gwynneth Albright, Medical Student 08/28/2020, 11:52 AMpr

## 2020-08-28 NOTE — Progress Notes (Signed)
Occupational Therapy Discharge Patient Details Name: Zachary [name] MRN: [id number] DOB: 1930-02-02 Today's Date: 08/28/2020 Time:  -     Patient discharged from OT services secondary to medical decline - will need to re-order OT to resume therapy services. Per chart review patient is requesting to go back to ALF on hospice services and is declining PT this date.  Please see latest therapy progress note for current level of functioning and progress toward goals.    Delbert Phenix OT OT pager: [place] 08/28/2020, 2:08 PM

## 2020-08-28 NOTE — TOC Transition Note (Signed)
Transition of Care [facility]) - CM/SW Discharge Note   Patient Details  Name: Zachary [name] MRN: [id number] Date of Birth: 1930-04-20  Transition of Care [facility]) CM/SW Contact:  Benard Halsted, LCSW Phone Number: 08/28/2020, 4:50 PM   Clinical Narrative:    Patient will DC to: [facility] Anticipated DC date: 08/28/20 Family notified: Pt notified his sister Transport by: Corey Harold   Per MD patient ready for DC to [facility]. RN to call report prior to discharge [phone]). RN, patient, patient's family, hospice, and facility notified of DC. Discharge Summary and FL2 sent to facility. DC packet on chart. Ambulance transport requested for patient.   CSW will sign off for now as social work intervention is no longer needed. Please consult [place] again if new needs arise.      Final next level of care: Assisted Living Barriers to Discharge: Barriers Resolved   Patient Goals and CMS Choice Patient states their goals for this hospitalization and ongoing recovery are:: comfort CMS Medicare.gov Compare Post Acute Care list provided to:: Patient Choice offered to / list presented to : Patient  Discharge Placement                Patient to be transferred to facility by: [place] Name of family member notified: Patient notified sister Patient and family notified of of transfer: 08/28/20  Discharge Plan and Services In-house Referral: Clinical Social Work,Hospice / [place] Acute Care Choice: Hospice                      [facility] Agency: Hospice and [place] Date [facility] Agency Contacted: 08/27/20   Representative spoke with at [place]: [place] ([place]) Interventions     Readmission Risk Interventions Readmission Risk Prevention Plan 01/15/2020 03/15/2019  Transportation Screening Complete Complete  PCP or Specialist Appt within 3-5 Days Complete Not Complete  Not Complete comments - pt going back to ALF  HRI  or Home Care Consult Complete Complete  Social Work Consult for [place] Planning/Counseling Complete Complete  Palliative Care Screening Not Applicable Not Applicable  Medication Review Press photographer) Referral to Pharmacy Complete  Some recent data might be hidden

## 2020-08-28 NOTE — NC FL2 (Signed)
[place] LEVEL OF CARE SCREENING TOOL     IDENTIFICATION  Patient Name: Zachary [name] Birthdate: [birth year] Sex: male Admission Date (Current Location): 08/24/2020  [facility] and [place] Number:  Herbalist and Address:  [facility], [place] [address] [place], [place] [address]      Provider Number: [id number]  Attending Physician Name and Address:  Axel Filler, *  Relative Name and Phone Number:  [name] [phone]    Current Level of Care: Hospital Recommended Level of Care: [place] Prior Approval Number:    Date Approved/Denied:   PASRR Number:    Discharge Plan: Other (Comment) (ALF)    Current Diagnoses: Patient Active Problem List   Diagnosis Date Noted   Disseminated cancer ([place]) 08/24/2020   CKD (chronic kidney disease), stage IV ([place]) 01/14/2020   History of CVA (cerebrovascular accident) 01/14/2020   Dry eyes 05/01/2017   Senile purpura ([place]) 07/18/2016   Aortic atherosclerosis ([place]) 02/26/2015   Allergic rhinitis 09/13/2013   Osteoarthritis of left knee 03/07/2013   Swelling of lower extremity 12/07/2012   Gastritis 07/06/2012   Obesity (BMI 30.0-34.9) 07/06/2012   Squamous cell skin cancer 07/06/2012   Healthcare maintenance 07/06/2012   Constipation due to pain medication 06/03/2011   Arthritis of hip 05/21/2010   Dizziness 10/30/2006   Anxiety 06/22/2006   Major depressive disorder, recurrent severe without psychotic features ([place]) 06/22/2006   Chronic pain disorder 06/22/2006   Essential hypertension 06/22/2006   Gastroesophageal reflux disease with stricture 06/22/2006   Diverticulosis of colon 06/22/2006   Benign prostatic hypertrophy with urinary obstruction 06/22/2006   Facial basal cell cancer 06/22/2006    Orientation RESPIRATION BLADDER Height & Weight     Self,Time,Situation,Place  O2 (2L nasal cannula) Incontinent,External catheter  Weight: 185 lb 3 oz (84 kg) Height:  5\' 6"  (167.6 cm)  BEHAVIORAL SYMPTOMS/MOOD NEUROLOGICAL BOWEL NUTRITION STATUS      Incontinent Diet (Pureed diet; thin liquids)  AMBULATORY STATUS COMMUNICATION OF NEEDS Skin   Extensive Assist Verbally Normal                       Personal Care Assistance Level of Assistance  Bathing,Feeding,Dressing Bathing Assistance: Maximum assistance Feeding assistance: Maximum assistance Dressing Assistance: Maximum assistance     Functional Limitations Info             SPECIAL CARE FACTORS FREQUENCY                       Contractures Contractures Info: Not present    Additional Factors Info  Code Status,Allergies,Psychotropic Code Status Info: DNR Allergies Info: Clindamycin/lincomycin, Lincomycin Hcl Psychotropic Info: Methadone; zoloft         Current Medications (08/28/2020):   Discharge Medications: STOP taking these medications   amLODipine 10 MG tablet Commonly known as: NORVASC   aspirin 81 MG EC tablet   atorvastatin 40 MG tablet Commonly known as: LIPITOR   clopidogrel 75 MG tablet Commonly known as: PLAVIX   ferrous gluconate 324 MG tablet Commonly known as: FERGON   fluticasone 50 MCG/ACT nasal spray Commonly known as: FLONASE   guaiFENesin 600 MG 12 hr tablet Commonly known as: MUCINEX   levocetirizine 5 MG tablet Commonly known as: XYZAL   lisinopril 20 MG tablet Commonly known as: ZESTRIL   omeprazole 20 MG capsule Commonly known as: PRILOSEC   risperiDONE 0.25 MG tablet Commonly known as: RISPERDAL  Tab-A-Vite Tabs     TAKE these medications   acetaminophen 325 MG tablet Commonly known as: TYLENOL Take 650 mg by mouth every 6 (six) hours as needed for headache.   ammonium lactate 12 % lotion Commonly known as: LAC-HYDRIN Apply 1 application topically 2 (two) times daily as needed for dry skin (apply to both legs and feet).   Bandage Roll 3"x75" Misc Use as  directed   Carboxymethylcellulose Sod PF 0.5 % Soln Place 1 drop into both eyes 3 (three) times daily.   diclofenac sodium 1 % Gel Commonly known as: VOLTAREN Apply 4 g topically 4 (four) times daily as needed.   feeding supplement Liqd Take 237 mLs by mouth 3 (three) times daily with meals.   finasteride 5 MG tablet Commonly known as: Proscar Take 1 tablet (5 mg total) by mouth daily.   furosemide 40 MG tablet Commonly known as: LASIX Take 1 tablet (40 mg total) by mouth daily.   LORazepam 0.5 MG tablet Commonly known as: ATIVAN Take 1 tablet (0.5 mg total) by mouth See admin instructions. Take one tablet (0.5 mg) by mouth three times daily, may also take one tablet (0.5 mg) every 12 hours as needed for anxiety What changed: additional instructions   methadone 5 MG tablet Commonly known as: DOLOPHINE Take 1 tablet (5 mg total) by mouth every 8 (eight) hours.   mirtazapine 30 MG tablet Commonly known as: REMERON Take 30 mg by mouth at bedtime.   ondansetron 4 MG tablet Commonly known as: ZOFRAN Take 1 tablet (4 mg total) by mouth every 8 (eight) hours as needed for up to 3 days for nausea or vomiting.   oxyCODONE 5 MG immediate release tablet Commonly known as: Roxicodone Take 1 tablet (5 mg total) by mouth every 4 (four) hours as needed for up to 3 days for severe pain.   polyethylene glycol powder 17 GM/SCOOP powder Commonly known as: GLYCOLAX/MIRALAX Take 17 g by mouth daily.   senna 8.6 MG tablet Commonly known as: SENOKOT Take 3 tablets by mouth at bedtime.   sertraline 100 MG tablet Commonly known as: ZOLOFT Take 150 mg by mouth at bedtime.   tamsulosin 0.4 MG Caps capsule Commonly known as: Flomax Take 1 capsule (0.4 mg total) by mouth daily.   wound dressings gel Apply topically as needed for wound care.    Relevant Imaging Results:  Relevant Lab Results:   Additional Information SS# [id number]. Hospice to follow at  ALF.  Benard Halsted, LCSW

## 2020-08-28 NOTE — Progress Notes (Signed)
PT Cancellation Note  Patient Details Name: HULBERT BRANSCOME MRN: [id number] DOB: 07/19/1929   Cancelled Treatment:    Reason Eval/Treat Not Completed: (P) Patient declined, no reason specified Pt in darkened room reports he would like to rest. When asked if therapy should come back he said not today.  PT will follow back next week if pt is still here.  Elizabeth B. Migdalia Dk PT, DPT Acute Rehabilitation Services Pager [phone] Office [phone]   [place] 08/28/2020, 11:20 AM

## 2020-08-29 NOTE — Progress Notes (Signed)
Provider made aware overnight about patient refusing to discharge.   Per patient he is not leaving at night, there is bad weather and the hospice nurse stated she was coming back to see him.   RN informed patient that there is no bad weather and that transportation will be here at some point tonight to get him. Family made aware of patient refusal to leave, their response " yea he is really hard headed, I hope he is doing okay though". Family unsuccessful in trying persuade him to leave.   Patient may possibly leave during the day. [facility] made aware about this change.

## 2020-08-29 NOTE — Progress Notes (Signed)
Patient refusing to be discharged. States that he will only leave during daylight hours. Family unsuccessful in changing his mind. This RN called [facility] to let them know and requested for early pickup in the morning.

## 2020-08-29 NOTE — Progress Notes (Signed)
CSW notified by RN of patient's refusal to leave last night. CSW advised RN that unless patient would like to appeal his discharge because he believes he is not medically ready for discharge, then security can be called to assist with removing patient. No TOC needs at this time, patient was already discharged yesterday.  Laveda Abbe, [place] Clinical Social Worker [phone]

## 2020-08-29 NOTE — Progress Notes (Signed)
   Brief progress note  Patient was discharged to assisted living facility with outpatient hospice care yesterday to continue comfort care measures.  At the end of the day the patient declined transport back to [place], he felt anxious about being moved in the setting of potentially bad weather.  He had no issues or concerns with the medical care that he received, he is comfortable with his current medication regimen and plans for outpatient hospice.  This morning he tells [place] that he is ready to be transported to [place] after lunch.  We greatly appreciate the empathy that 5 [place] has shown allowing him to board in his room overnight.  No charge visit for today from our group.   Axel Filler, MD 08/29/2020, 9:32 AM

## 2020-08-29 NOTE — Plan of Care (Signed)
Patient is currently resting in bed. C/o pain, given PRN pain meds. Per patient the morphine is not working. VSS. Placed on 2L. Call bell within reach. Bed alarm on. Plan to Discharge today.   Problem: Education: Goal: Knowledge of General Education information will improve Description: Including pain rating scale, medication(s)/side effects and non-pharmacologic comfort measures Outcome: Progressing   Problem: Health Behavior/Discharge Planning: Goal: Ability to manage health-related needs will improve Outcome: Progressing   Problem: Clinical Measurements: Goal: Ability to maintain clinical measurements within normal limits will improve Outcome: Progressing Goal: Will remain free from infection Outcome: Progressing Goal: Diagnostic test results will improve Outcome: Progressing Goal: Respiratory complications will improve Outcome: Progressing Goal: Cardiovascular complication will be avoided Outcome: Progressing   Problem: Activity: Goal: Risk for activity intolerance will decrease Outcome: Progressing   Problem: Nutrition: Goal: Adequate nutrition will be maintained Outcome: Progressing   Problem: Coping: Goal: Level of anxiety will decrease Outcome: Progressing   Problem: Elimination: Goal: Will not experience complications related to bowel motility Outcome: Progressing Goal: Will not experience complications related to urinary retention Outcome: Progressing   Problem: Pain Managment: Goal: General experience of comfort will improve Outcome: Progressing   Problem: Safety: Goal: Ability to remain free from injury will improve Outcome: Progressing   Problem: Skin Integrity: Goal: Risk for impaired skin integrity will decrease Outcome: Progressing

## 2020-10-18 DEATH — deceased
